# Patient Record
Sex: Female | Born: 1965 | Race: White | Hispanic: No | Smoking: Former smoker
Health system: Southern US, Community
[De-identification: ages and names within clinical notes are randomized; demographics above are authoritative.]

## PROBLEM LIST (undated history)

## (undated) DIAGNOSIS — Z923 Personal history of irradiation: Secondary | ICD-10-CM

## (undated) DIAGNOSIS — N390 Urinary tract infection, site not specified: Secondary | ICD-10-CM

## (undated) DIAGNOSIS — M7500 Adhesive capsulitis of unspecified shoulder: Secondary | ICD-10-CM

## (undated) DIAGNOSIS — Z808 Family history of malignant neoplasm of other organs or systems: Secondary | ICD-10-CM

## (undated) DIAGNOSIS — Z803 Family history of malignant neoplasm of breast: Secondary | ICD-10-CM

## (undated) DIAGNOSIS — L52 Erythema nodosum: Secondary | ICD-10-CM

## (undated) DIAGNOSIS — A64 Unspecified sexually transmitted disease: Secondary | ICD-10-CM

## (undated) HISTORY — DX: Family history of malignant neoplasm of breast: Z80.3

## (undated) HISTORY — DX: Adhesive capsulitis of unspecified shoulder: M75.00

## (undated) HISTORY — DX: Erythema nodosum: L52

## (undated) HISTORY — DX: Unspecified sexually transmitted disease: A64

## (undated) HISTORY — PX: CLOSED MANIPULATION SHOULDER: SUR205

## (undated) HISTORY — DX: Family history of malignant neoplasm of other organs or systems: Z80.8

## (undated) HISTORY — DX: Urinary tract infection, site not specified: N39.0

## (undated) HISTORY — PX: BOTOX INJECTION: SHX5754

---

## 2000-11-29 ENCOUNTER — Emergency Department (HOSPITAL_COMMUNITY): Admission: EM | Admit: 2000-11-29 | Discharge: 2000-11-30 | Payer: Self-pay

## 2002-06-18 ENCOUNTER — Other Ambulatory Visit: Admission: RE | Admit: 2002-06-18 | Discharge: 2002-06-18 | Payer: Self-pay | Admitting: Obstetrics and Gynecology

## 2004-03-01 ENCOUNTER — Other Ambulatory Visit: Admission: RE | Admit: 2004-03-01 | Discharge: 2004-03-01 | Payer: Self-pay | Admitting: Obstetrics and Gynecology

## 2004-03-21 ENCOUNTER — Other Ambulatory Visit: Admission: RE | Admit: 2004-03-21 | Discharge: 2004-03-21 | Payer: Self-pay | Admitting: Obstetrics and Gynecology

## 2005-04-17 ENCOUNTER — Other Ambulatory Visit: Admission: RE | Admit: 2005-04-17 | Discharge: 2005-04-17 | Payer: Self-pay | Admitting: Obstetrics and Gynecology

## 2006-05-28 ENCOUNTER — Other Ambulatory Visit: Admission: RE | Admit: 2006-05-28 | Discharge: 2006-05-28 | Payer: Self-pay | Admitting: Obstetrics and Gynecology

## 2006-09-17 ENCOUNTER — Other Ambulatory Visit: Admission: RE | Admit: 2006-09-17 | Discharge: 2006-09-17 | Payer: Self-pay | Admitting: Obstetrics and Gynecology

## 2007-06-04 ENCOUNTER — Other Ambulatory Visit: Admission: RE | Admit: 2007-06-04 | Discharge: 2007-06-04 | Payer: Self-pay | Admitting: Obstetrics and Gynecology

## 2008-06-09 ENCOUNTER — Other Ambulatory Visit: Admission: RE | Admit: 2008-06-09 | Discharge: 2008-06-09 | Payer: Self-pay | Admitting: Obstetrics and Gynecology

## 2009-04-16 LAB — HM DEXA SCAN: HM Dexa Scan: NORMAL

## 2009-08-08 LAB — HM PAP SMEAR: HM Pap smear: NEGATIVE

## 2011-06-22 LAB — HM MAMMOGRAPHY

## 2012-08-22 ENCOUNTER — Encounter: Payer: Self-pay | Admitting: *Deleted

## 2012-08-25 ENCOUNTER — Ambulatory Visit (INDEPENDENT_AMBULATORY_CARE_PROVIDER_SITE_OTHER): Payer: BC Managed Care – PPO | Admitting: Obstetrics and Gynecology

## 2012-08-25 ENCOUNTER — Encounter: Payer: Self-pay | Admitting: Obstetrics and Gynecology

## 2012-08-25 VITALS — BP 100/60 | Ht 65.0 in | Wt 159.0 lb

## 2012-08-25 DIAGNOSIS — Z23 Encounter for immunization: Secondary | ICD-10-CM

## 2012-08-25 DIAGNOSIS — Z0000 Encounter for general adult medical examination without abnormal findings: Secondary | ICD-10-CM

## 2012-08-25 DIAGNOSIS — Z01419 Encounter for gynecological examination (general) (routine) without abnormal findings: Secondary | ICD-10-CM

## 2012-08-25 LAB — POCT URINALYSIS DIPSTICK
Bilirubin, UA: NEGATIVE
Blood, UA: NEGATIVE
Glucose, UA: NEGATIVE
Ketones, UA: NEGATIVE
Leukocytes, UA: NEGATIVE
Nitrite, UA: NEGATIVE
Protein, UA: NEGATIVE
Urobilinogen, UA: NEGATIVE
pH, UA: 5

## 2012-08-25 MED ORDER — SULFAMETHOXAZOLE-TMP DS 800-160 MG PO TABS: 1.0000 | ORAL_TABLET | Freq: Two times a day (BID) | ORAL | Status: DC

## 2012-08-25 NOTE — Progress Notes (Signed)
47 y.o.  separated  Caucasian female   G2P2 here for annual exam.  Menses regular and normal.  Having neck and shoulder pain and doing massage.Same partner for over a year.  No HSV outbreaks.  Was getting recurrent UTI's and saw Shirlyn Goltz and she rx'd Septra DS after intercourse.  Pt requests a refill.    Patient's last menstrual period was 07/29/2012.          Sexually active: yes  The current method of family planning is vasectomy.    Exercising: walking 3 days a week Last mammogram: 06/22/11 benign  Reminded to go ASAP.   Last pap smear:08/08/09 neg History of abnormal pap: yes 2002 CIN 1(colpo) Smoking:quit 30 years ago Alcohol:2-3 glasses of wine a week Last colonoscopy:never Last Bone Density:  At work 2011 normal Last tetanus shot: greater than 10 years Last cholesterol check: 11/2011 total 198  Hgb:   13.3             Urine:neg    No health maintenance topics applied.  Family History  Problem Relation Age of Onset  . Thyroid disease Mother   . Osteopenia Mother   . Thyroid disease Sister   . Thyroid disease Maternal Grandmother     There are no active problems to display for this patient.   Past Medical History  Diagnosis Date  . STD (sexually transmitted disease)     H/O HSV II  . Frequent UTI   . Erythema nodosum     History reviewed. No pertinent past surgical history.  Allergies: Review of patient's allergies indicates no known allergies.  Current Outpatient Prescriptions  Medication Sig Dispense Refill  . famciclovir (FAMVIR) 125 MG tablet Take 125 mg by mouth as needed.       . Tretinoin (RETIN-A EX) Apply topically.       No current facility-administered medications for this visit.    ROS: Pertinent items are noted in HPI.  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    Children are Gerri Spore and [place], both are out working. Gerri Spore still lives at home and stopped college after one year - college wasn't  for him.  [place] working and living in [place], working for [employer] .   Exam:    BP 100/60  Ht 5\' 5"  (1.651 m)  Wt 159 lb (72.122 kg)  BMI 26.46 kg/m2  LMP 07/29/2012  Up 8 pounds since last year; height stable Wt Readings from Last 3 Encounters:  08/25/12 159 lb (72.122 kg)     Ht Readings from Last 3 Encounters:  08/25/12 5\' 5"  (1.651 m)    General appearance: alert, cooperative and appears stated age Head: Normocephalic, without obvious abnormality, atraumatic Neck: no adenopathy, supple, symmetrical, trachea midline and thyroid not enlarged, symmetric, no tenderness/mass/nodules Lungs: clear to auscultation bilaterally Breasts: Inspection negative, No nipple retraction or dimpling, No nipple discharge or bleeding, No axillary or supraclavicular adenopathy, Normal to palpation without dominant masses Heart: regular rate and rhythm Abdomen: soft, non-tender; bowel sounds normal; no masses,  no organomegaly Extremities: extremities normal, atraumatic, no cyanosis or edema Skin: Skin color, texture, turgor normal. No rashes or lesions Lymph nodes: Cervical, supraclavicular, and axillary nodes normal. No abnormal inguinal nodes palpated Neurologic: Grossly normal   Pelvic: External genitalia:  no lesions              Urethra:  normal appearing urethra with no masses, tenderness or lesions  Bartholins and Skenes: normal                 Vagina: normal appearing vagina with normal color and discharge, no lesions              Cervix: normal appearance              Pap taken: yes        Bimanual Exam:  Uterus:  uterus is normal size, shape, consistency and nontender, RF, mobile                                      Adnexa: normal adnexa in size, nontender and no masses                                      Rectovaginal: Confirms                                      Anus:  normal sphincter tone, no lesions  A: normal gyn exam     H/O HSV, no outbreaks since 2004     H/o CIN 1  2002     H/O recurrent UTI's, uses Septra DS after intercourse for prophylaxis  P: mammogram pap smear counseled on breast self exam, mammography screening, adequate intake of calcium and vitamin D, diet and exercise return annually or prn Tdap today     An After Visit Summary was printed and given to the patient.

## 2012-08-25 NOTE — Patient Instructions (Signed)

## 2012-08-26 LAB — HEMOGLOBIN, FINGERSTICK: Hemoglobin, fingerstick: 13.3 g/dL (ref 12.0–16.0)

## 2012-08-28 LAB — IPS PAP TEST WITH HPV

## 2013-09-04 ENCOUNTER — Ambulatory Visit (INDEPENDENT_AMBULATORY_CARE_PROVIDER_SITE_OTHER): Payer: BC Managed Care – PPO | Admitting: Obstetrics and Gynecology

## 2013-09-04 ENCOUNTER — Encounter: Payer: Self-pay | Admitting: Obstetrics and Gynecology

## 2013-09-04 ENCOUNTER — Ambulatory Visit: Payer: BC Managed Care – PPO | Admitting: Obstetrics and Gynecology

## 2013-09-04 VITALS — BP 110/70 | HR 60 | Ht 64.75 in | Wt 154.0 lb

## 2013-09-04 DIAGNOSIS — Z113 Encounter for screening for infections with a predominantly sexual mode of transmission: Secondary | ICD-10-CM

## 2013-09-04 DIAGNOSIS — Z0000 Encounter for general adult medical examination without abnormal findings: Secondary | ICD-10-CM

## 2013-09-04 DIAGNOSIS — R6889 Other general symptoms and signs: Secondary | ICD-10-CM

## 2013-09-04 DIAGNOSIS — Z1211 Encounter for screening for malignant neoplasm of colon: Secondary | ICD-10-CM

## 2013-09-04 DIAGNOSIS — Z01419 Encounter for gynecological examination (general) (routine) without abnormal findings: Secondary | ICD-10-CM

## 2013-09-04 LAB — POCT URINALYSIS DIPSTICK
Bilirubin, UA: NEGATIVE
Blood, UA: NEGATIVE
Glucose, UA: NEGATIVE
Ketones, UA: NEGATIVE
Leukocytes, UA: NEGATIVE
Nitrite, UA: NEGATIVE
Protein, UA: NEGATIVE
Urobilinogen, UA: NEGATIVE
pH, UA: 5

## 2013-09-04 LAB — CBC
HCT: 39 % (ref 36.0–46.0)
Hemoglobin: 12.8 g/dL (ref 12.0–15.0)
MCH: 29.4 pg (ref 26.0–34.0)
MCHC: 32.8 g/dL (ref 30.0–36.0)
MCV: 89.4 fL (ref 78.0–100.0)
Platelets: 273 10*3/uL (ref 150–400)
RBC: 4.36 MIL/uL (ref 3.87–5.11)
RDW: 13.6 % (ref 11.5–15.5)
WBC: 8 10*3/uL (ref 4.0–10.5)

## 2013-09-04 NOTE — Progress Notes (Addendum)
Patient ID: Michelle Anthony, female   DOB: September 27, 1965, 48 y.o.   MRN: [id number] GYNECOLOGY VISIT  PCP:  [facility]  Referring provider:   HPI: 48 y.o.   Unknown  Caucasian  female   G2P2 with Patient's last menstrual period was 08/31/2013.   here for  AEX.  Still having menses. Second day is heavy.  Advil helps cramping. No problem per patient.  No hot flashes or night sweats.   Wants STD testing.  Has a new partner, a former boyfriend.   Patient's dad just had appendiceal cancer.   Does cholesterol check at work. Total is about 195.    Urine:    GYNECOLOGIC HISTORY: Patient's last menstrual period was 08/31/2013. Sexually active:  yes Partner preference: female Contraception:   vasecomy Menopausal hormone therapy: n/a DES exposure: no   Blood transfusions:   no Sexually transmitted diseases:  Hx HSV  GYN procedures and prior surgeries:  Colposcopy 2002 Last mammogram: 02-09-13 mass right breast.  Had ultrasound which revealed simple cyst right breast at 10:00 and 6:00.  Returning to screening mammograms:[facility]                Last pap and high risk HPV testing:   08-26-12 wnl:neg HR HPV History of abnormal pap smear:  2002 had colposcopy for abnormal pap, but no treatment and paps returned to normal.   OB History   Grav Para Term Preterm Abortions TAB SAB Ect Mult Living   2 2        2        LIFESTYLE: Exercise: walking              Tobacco: no Alcohol:    3 glasses of red wine per week Drug use:  no  OTHER HEALTH MAINTENANCE: Tetanus/TDap:  2014 Gardisil:             n/a Influenza:          Every other year Zostavax:          n/a  Bone density:    n/a Colonoscopy:    n/a  Cholesterol check: normal through work   Family History  Problem Relation Age of Onset  . Thyroid disease Mother   . Osteopenia Mother   . Thyroid disease Sister   . Thyroid disease Maternal Grandmother   . Cancer Paternal Grandfather     liver cancer  .  Cancer Father 16    appendiceal cancer--stage IV  . Breast cancer Maternal Aunt   . Breast cancer Paternal Aunt     There are no active problems to display for this patient.  Past Medical History  Diagnosis Date  . STD (sexually transmitted disease)     H/O HSV II  . Frequent UTI   . Erythema nodosum     History reviewed. No pertinent past surgical history.  ALLERGIES: Review of patient's allergies indicates no known allergies.  Current Outpatient Prescriptions  Medication Sig Dispense Refill  . Tretinoin (RETIN-A EX) Apply topically.       No current facility-administered medications for this visit.     ROS:  Pertinent items are noted in HPI.  SOCIAL HISTORY:  Divorced.  2 children.   PHYSICAL EXAMINATION:    BP 110/70  Pulse 60  Ht 5' 4.75" (1.645 m)  Wt 154 lb (69.854 kg)  BMI 25.81 kg/m2  LMP 08/31/2013   Wt Readings from Last 3 Encounters:  09/04/13 154 lb (69.854 kg)  08/25/12 159  lb (72.122 kg)     Ht Readings from Last 3 Encounters:  09/04/13 5' 4.75" (1.645 m)  08/25/12 5\' 5"  (1.651 m)    General appearance: alert, cooperative and appears stated age Head: Normocephalic, without obvious abnormality, atraumatic Neck: no adenopathy, supple, symmetrical, trachea midline and thyroid not enlarged, symmetric, no tenderness/mass/nodules Lungs: clear to auscultation bilaterally Breasts: Inspection negative, No nipple retraction or dimpling, No nipple discharge or bleeding, No axillary or supraclavicular adenopathy, Normal to palpation without dominant masses Heart: regular rate and rhythm Abdomen: soft, non-tender; no masses,  no organomegaly Extremities: extremities normal, atraumatic, no cyanosis or edema Skin: Skin color, texture, turgor normal. No rashes or lesions Lymph nodes: Cervical, supraclavicular, and axillary nodes normal. No abnormal inguinal nodes palpated Neurologic: Grossly normal  Pelvic: External genitalia:  no lesions               Urethra:  normal appearing urethra with no masses, tenderness or lesions              Bartholins and Skenes: normal                 Vagina: normal appearing vagina with normal color and discharge, no lesions, menstrual flow noted.               Cervix: normal appearance              Pap and high risk HPV testing done: no.            Bimanual Exam:  Uterus:  uterus is normal size, shape, consistency and nontender                                      Adnexa: normal adnexa in size, nontender and no masses                                      Rectovaginal: Confirms                                      Anus:  normal sphincter tone, no lesions  ASSESSMENT  Normal gynecologic exam. Desire for STD testing.   PLAN  Mammogram recommended yearly.  Pap smear and high risk HPV testing not indicated.  Lipid profile, CMP, CBC, TSH. STD panel.  Counseled on self breast exam. Referral for colonoscopy - Dr. Collene Mares.  Return annually or prn   An After Visit Summary was printed and given to the patient.

## 2013-09-04 NOTE — Patient Instructions (Signed)

## 2013-09-04 NOTE — Addendum Note (Signed)
Addended by: Lowella Fairy on: 09/04/2013 05:06 PM   Modules accepted: Orders

## 2013-09-05 LAB — COMPREHENSIVE METABOLIC PANEL
ALT: 13 U/L (ref 0–35)
AST: 14 U/L (ref 0–37)
Albumin: 4.1 g/dL (ref 3.5–5.2)
Alkaline Phosphatase: 47 U/L (ref 39–117)
BUN: 20 mg/dL (ref 6–23)
CO2: 30 mEq/L (ref 19–32)
Calcium: 9.5 mg/dL (ref 8.4–10.5)
Chloride: 102 mEq/L (ref 96–112)
Creat: 0.88 mg/dL (ref 0.50–1.10)
Glucose, Bld: 84 mg/dL (ref 70–99)
Potassium: 4.3 mEq/L (ref 3.5–5.3)
Sodium: 140 mEq/L (ref 135–145)
Total Bilirubin: 0.3 mg/dL (ref 0.2–1.2)
Total Protein: 6.8 g/dL (ref 6.0–8.3)

## 2013-09-05 LAB — STD PANEL
HIV 1&2 Ab, 4th Generation: NONREACTIVE
Hepatitis B Surface Ag: NEGATIVE

## 2013-09-05 LAB — LIPID PANEL
Cholesterol: 195 mg/dL (ref 0–200)
HDL: 46 mg/dL (ref >39)
LDL Cholesterol: 123 mg/dL — ABNORMAL HIGH (ref 0–99)
Total CHOL/HDL Ratio: 4.2 Ratio
Triglycerides: 128 mg/dL (ref <150)
VLDL: 26 mg/dL (ref 0–40)

## 2013-09-05 LAB — TSH: TSH: 1.191 u[IU]/mL (ref 0.350–4.500)

## 2013-09-05 LAB — HEPATITIS C ANTIBODY: HCV Ab: NEGATIVE

## 2013-09-05 LAB — GC/CHLAMYDIA PROBE AMP, URINE
Chlamydia, Swab/Urine, PCR: NEGATIVE
GC Probe Amp, Urine: NEGATIVE

## 2013-09-14 ENCOUNTER — Telehealth: Payer: Self-pay | Admitting: Obstetrics and Gynecology

## 2013-09-14 NOTE — Telephone Encounter (Signed)
Per fax received from Dr Youlanda Mighty office. Patient will wait on this procedure. She will have it in November when she turns 48.

## 2013-09-14 NOTE — Telephone Encounter (Signed)
Thank you.  I will close the encounter. 

## 2014-02-15 ENCOUNTER — Encounter: Payer: Self-pay | Admitting: Obstetrics and Gynecology

## 2014-10-04 ENCOUNTER — Telehealth: Payer: Self-pay | Admitting: Obstetrics and Gynecology

## 2014-10-04 NOTE — Telephone Encounter (Signed)
Left message to call Annalina Needles at [phone]. 

## 2014-10-04 NOTE — Telephone Encounter (Signed)
Patient wants to start birth control

## 2014-10-04 NOTE — Telephone Encounter (Signed)
Returning a call to Michelle Anthony. °

## 2014-10-04 NOTE — Telephone Encounter (Signed)
Spoke with patient. Patient would like to start on OCP for contraception. Aex is on 12/10/2014 at 3:45pm. Patient would like to be seen earlier for appointment. Last aex was on 09/04/2013 with Dr.Silva. Aex moved to 6/27 at 12:45pm with Dr.Silva. Patient is agreeable to date and time.  Routing to provider for final review. Patient agreeable to disposition. Will close encounter.

## 2014-10-11 ENCOUNTER — Ambulatory Visit (INDEPENDENT_AMBULATORY_CARE_PROVIDER_SITE_OTHER): Payer: BLUE CROSS/BLUE SHIELD | Admitting: Obstetrics and Gynecology

## 2014-10-11 ENCOUNTER — Other Ambulatory Visit: Payer: Self-pay | Admitting: Obstetrics and Gynecology

## 2014-10-11 ENCOUNTER — Encounter: Payer: Self-pay | Admitting: Obstetrics and Gynecology

## 2014-10-11 ENCOUNTER — Ambulatory Visit: Payer: Self-pay | Admitting: Obstetrics and Gynecology

## 2014-10-11 VITALS — BP 110/68 | HR 64 | Resp 16 | Ht 64.5 in | Wt 146.0 lb

## 2014-10-11 DIAGNOSIS — Z01419 Encounter for gynecological examination (general) (routine) without abnormal findings: Secondary | ICD-10-CM

## 2014-10-11 DIAGNOSIS — Z1211 Encounter for screening for malignant neoplasm of colon: Secondary | ICD-10-CM | POA: Diagnosis not present

## 2014-10-11 DIAGNOSIS — Z113 Encounter for screening for infections with a predominantly sexual mode of transmission: Secondary | ICD-10-CM | POA: Diagnosis not present

## 2014-10-11 DIAGNOSIS — Z0000 Encounter for general adult medical examination without abnormal findings: Secondary | ICD-10-CM

## 2014-10-11 DIAGNOSIS — N63 Unspecified lump in breast: Secondary | ICD-10-CM

## 2014-10-11 DIAGNOSIS — N6311 Unspecified lump in the right breast, upper outer quadrant: Secondary | ICD-10-CM

## 2014-10-11 DIAGNOSIS — Z833 Family history of diabetes mellitus: Secondary | ICD-10-CM | POA: Diagnosis not present

## 2014-10-11 LAB — HEMOGLOBIN A1C
Hgb A1c MFr Bld: 5.3 % (ref <5.7)
Mean Plasma Glucose: 105 mg/dL (ref <117)

## 2014-10-11 MED ORDER — NORETHINDRONE 0.35 MG PO TABS: 1.0000 | ORAL_TABLET | Freq: Every day | ORAL | Status: DC

## 2014-10-11 NOTE — Progress Notes (Signed)
Patient ID: Michelle Anthony, female   DOB: [birth year], 49 y.o.   MRN: [id number] 49 y.o. G2P2 Legally Separated Caucasian female here for annual exam. She would like to discuss birth control options. Menses q 4 weeks (one time 5 weeks) x 5 days. Saturates a super tampon in up to 2-3 hours for 1 day with some clots. Moderate cramps (stable), helped with advil. She has a new partner x 3-4 months, just started to be sexually active. Using condoms. No dyspareunia. Was told she had hsv, only one outbreak, doesn't think she has it, desires testing. She is also interested in contraception. She c/o getting very hungry every few hours. Positive FH of diabetes.   PCP:  No PCP  Patient's last menstrual period was 10/10/2014.          Sexually active: Yes.    The current method of family planning is condoms.    Exercising: Yes.    walking Smoker:  Former smoker  Health Maintenance: Pap:  08-26-12 WNL NEG HR HPV History of abnormal Pap:  Yes 2002 had colposcopy but no treatments MMG:  03-16-13 Mass right breast- U/S right breast performed 02-26-13 benign-return to routine screening Colonoscopy:  Never  BMD:  Never TDaP:  08-25-12 Screening Labs:  Hb today: 12.4, Urine today: not done patient on menstrual cycle    reports that she quit smoking about 32 years ago. Her smoking use included Cigarettes. She has never used smokeless tobacco. She reports that she drinks about 2.4 oz of alcohol per week. She reports that she does not use illicit drugs.  Past Medical History  Diagnosis Date  . STD (sexually transmitted disease)     H/O HSV II  . Frequent UTI   . Erythema nodosum     History reviewed. No pertinent past surgical history.  Current Outpatient Prescriptions  Medication Sig Dispense Refill  . Tretinoin (RETIN-A EX) Apply topically.    . norethindrone (ORTHO MICRONOR) 0.35 MG tablet Take 1 tablet (0.35 mg total) by mouth daily. 3 Package 3   No current facility-administered medications for  this visit.    Family History  Problem Relation Age of Onset  . Thyroid disease Mother   . Osteopenia Mother   . Thyroid disease Sister   . Thyroid disease Maternal Grandmother   . Cancer Paternal Grandfather     liver cancer  . Cancer Father 81    appendiceal cancer--stage IV  . Breast cancer Maternal Aunt   . Breast cancer Paternal Aunt   . Diabetes Paternal Aunt   . Diabetes Paternal Uncle     ROS:  Pertinent items are noted in HPI.  Otherwise, a comprehensive ROS was negative.  Exam:   BP 110/68 mmHg  Pulse 64  Resp 16  Ht 5' 4.5" (1.638 m)  Wt 146 lb (66.225 kg)  BMI 24.68 kg/m2  LMP 10/10/2014    General appearance: alert, cooperative and appears stated age Head: Normocephalic, without obvious abnormality, atraumatic Neck: no adenopathy, supple, symmetrical, trachea midline and thyroid normal to inspection and palpation Lungs: clear to auscultation bilaterally Breasts:In the right breast at 10 o'clock is a firm lump just outside the areolar region measuring 2 cm and more laterally is a 3-4 cm area, ? Cystic, different than the other breast. The left breast is without lumps dimpling or adenopathy.  Heart: regular rate and rhythm Abdomen: soft, non-tender; bowel sounds normal; no masses,  no organomegaly Extremities: extremities normal, atraumatic, no cyanosis or edema Skin: Skin color,  texture, turgor normal. No rashes or lesions Lymph nodes: Cervical, supraclavicular, and axillary nodes normal. No abnormal inguinal nodes palpated Neurologic: Grossly normal  Pelvic: External genitalia:  no lesions              Urethra:  normal appearing urethra with no masses, tenderness or lesions              Bartholins and Skenes: normal                 Vagina: normal appearing vagina with normal color and discharge, no lesions              Cervix: no lesions              Pap taken: No. Bimanual Exam:  Uterus:  normal size, contour, position, consistency, mobility, non-tender  and anteverted              Adnexa: normal adnexa              Rectovaginal: Yes.  .  Confirms.              Anus:  normal sphincter tone, no lesions  Chaperone was present for exam.  Assessment:   Well woman visit with normal exam. STD testing Screening for diabetes Contraception management Right breast lump, h/o breast cyst  Plan: Diagnostic breast imaging, ultrasound of right breast (upper outer quadrant) Recommended self breast exam.  Cholesterol normal at work Check STD testing, HgbA1C Colonoscopy recommended, name given Discussed Calcium, Vitamin D, regular exercise program including cardiovascular and weight bearing exercise. Labs performed.  Yes.  .   See orders. Discussed options of contraception, she is interested in the mini-pill, information given on the mirena IUD Follow up annually and prn.  No pap needed this year (negative pap and hpv on 08/26/12)

## 2014-10-12 ENCOUNTER — Telehealth: Payer: Self-pay | Admitting: *Deleted

## 2014-10-12 ENCOUNTER — Encounter: Payer: Self-pay | Admitting: Obstetrics and Gynecology

## 2014-10-12 LAB — HSV(HERPES SIMPLEX VRS) I + II AB-IGG

## 2014-10-12 LAB — HIV 1/2 CONFIRMATION
HIV-1 antibody: NEGATIVE
HIV-2 Ab: NEGATIVE

## 2014-10-12 LAB — HIV ANTIBODY (ROUTINE TESTING W REFLEX): HIV 1&2 Ab, 4th Generation: REACTIVE — AB

## 2014-10-12 LAB — RPR

## 2014-10-12 LAB — HEPATITIS B SURFACE ANTIGEN: Hepatitis B Surface Ag: NEGATIVE

## 2014-10-12 NOTE — Telephone Encounter (Signed)
I spoke with patient and informed her that the labs that have come back so far are normal. I let her know the ones that are still pending and informed her I would call when those results came in. -eh

## 2014-10-12 NOTE — Telephone Encounter (Signed)
-----   Message from Salvadore Dom, MD sent at 10/12/2014 11:41 AM EDT ----- Please inform the patient that her labs are normal. The genprobe and hsv serology are still pending.

## 2014-10-14 ENCOUNTER — Ambulatory Visit: Payer: Self-pay | Admitting: Obstetrics and Gynecology

## 2014-10-14 ENCOUNTER — Other Ambulatory Visit: Payer: Self-pay | Admitting: Obstetrics and Gynecology

## 2014-10-14 DIAGNOSIS — Z113 Encounter for screening for infections with a predominantly sexual mode of transmission: Secondary | ICD-10-CM

## 2014-10-14 LAB — IPS N GONORRHOEA AND CHLAMYDIA BY PCR

## 2014-10-14 LAB — HIV-1 RNA, QUALITATIVE, TMA

## 2014-10-19 LAB — HEMOGLOBIN, FINGERSTICK: Hemoglobin, fingerstick: 12.4 g/dL (ref 12.0–16.0)

## 2014-10-20 ENCOUNTER — Other Ambulatory Visit (INDEPENDENT_AMBULATORY_CARE_PROVIDER_SITE_OTHER): Payer: BLUE CROSS/BLUE SHIELD

## 2014-10-20 ENCOUNTER — Telehealth: Payer: Self-pay | Admitting: Emergency Medicine

## 2014-10-20 ENCOUNTER — Other Ambulatory Visit: Payer: Self-pay | Admitting: Obstetrics and Gynecology

## 2014-10-20 ENCOUNTER — Other Ambulatory Visit: Payer: Self-pay | Admitting: Emergency Medicine

## 2014-10-20 DIAGNOSIS — Z113 Encounter for screening for infections with a predominantly sexual mode of transmission: Secondary | ICD-10-CM

## 2014-10-20 NOTE — Telephone Encounter (Signed)
Call to patient to advise she is scheduled for Breast Imaging at [facility] for 11/01/14 at 0915   Detailed message okay per designated party release form.

## 2014-10-21 LAB — HSV(HERPES SMPLX)ABS-I+II(IGG+IGM)-BLD
HSV 1 Glycoprotein G Ab, IgG: 0.24 IV
HSV 2 Glycoprotein G Ab, IgG: 5.97 IV — ABNORMAL HIGH
Herpes Simplex Vrs I&II-IgM Ab (EIA): 0.99 INDEX

## 2014-10-24 LAB — HIV-1 RNA, QUALITATIVE, TMA

## 2014-11-10 ENCOUNTER — Ambulatory Visit: Payer: Self-pay | Admitting: Obstetrics and Gynecology

## 2014-11-22 NOTE — Telephone Encounter (Signed)
Patinet was seen at [facility] as below. Imaging completed and out of hold per Dr. Talbert Nan.

## 2014-12-10 ENCOUNTER — Ambulatory Visit: Payer: Self-pay | Admitting: Obstetrics and Gynecology

## 2015-10-12 ENCOUNTER — Ambulatory Visit: Payer: BLUE CROSS/BLUE SHIELD | Admitting: Obstetrics and Gynecology

## 2015-10-26 ENCOUNTER — Ambulatory Visit: Payer: BLUE CROSS/BLUE SHIELD | Admitting: Obstetrics and Gynecology

## 2015-10-31 ENCOUNTER — Encounter: Payer: Self-pay | Admitting: Obstetrics and Gynecology

## 2015-10-31 ENCOUNTER — Ambulatory Visit (INDEPENDENT_AMBULATORY_CARE_PROVIDER_SITE_OTHER): Payer: BLUE CROSS/BLUE SHIELD | Admitting: Obstetrics and Gynecology

## 2015-10-31 VITALS — BP 98/60 | HR 64 | Resp 14 | Ht 65.0 in | Wt 150.0 lb

## 2015-10-31 DIAGNOSIS — Z3009 Encounter for other general counseling and advice on contraception: Secondary | ICD-10-CM | POA: Diagnosis not present

## 2015-10-31 DIAGNOSIS — Z0000 Encounter for general adult medical examination without abnormal findings: Secondary | ICD-10-CM | POA: Diagnosis not present

## 2015-10-31 DIAGNOSIS — Z124 Encounter for screening for malignant neoplasm of cervix: Secondary | ICD-10-CM | POA: Diagnosis not present

## 2015-10-31 DIAGNOSIS — Z01419 Encounter for gynecological examination (general) (routine) without abnormal findings: Secondary | ICD-10-CM | POA: Diagnosis not present

## 2015-10-31 DIAGNOSIS — R5383 Other fatigue: Secondary | ICD-10-CM | POA: Diagnosis not present

## 2015-10-31 DIAGNOSIS — N921 Excessive and frequent menstruation with irregular cycle: Secondary | ICD-10-CM

## 2015-10-31 LAB — COMPREHENSIVE METABOLIC PANEL
ALT: 12 U/L (ref 6–29)
AST: 12 U/L (ref 10–35)
Albumin: 4.1 g/dL (ref 3.6–5.1)
Alkaline Phosphatase: 55 U/L (ref 33–130)
BUN: 21 mg/dL (ref 7–25)
CO2: 28 mmol/L (ref 20–31)
Calcium: 9.6 mg/dL (ref 8.6–10.4)
Chloride: 104 mmol/L (ref 98–110)
Creat: 0.86 mg/dL (ref 0.50–1.05)
Glucose, Bld: 92 mg/dL (ref 65–99)
Potassium: 3.8 mmol/L (ref 3.5–5.3)
Sodium: 141 mmol/L (ref 135–146)
Total Bilirubin: 0.3 mg/dL (ref 0.2–1.2)
Total Protein: 6.5 g/dL (ref 6.1–8.1)

## 2015-10-31 LAB — CBC
HCT: 40.6 % (ref 35.0–45.0)
Hemoglobin: 13.2 g/dL (ref 11.7–15.5)
MCH: 30.3 pg (ref 27.0–33.0)
MCHC: 32.5 g/dL (ref 32.0–36.0)
MCV: 93.3 fL (ref 80.0–100.0)
MPV: 10.5 fL (ref 7.5–12.5)
Platelets: 282 10*3/uL (ref 140–400)
RBC: 4.35 MIL/uL (ref 3.80–5.10)
RDW: 13.4 % (ref 11.0–15.0)
WBC: 8.2 10*3/uL (ref 3.8–10.8)

## 2015-10-31 LAB — TSH: TSH: 0.64 mIU/L

## 2015-10-31 MED ORDER — NORETHINDRONE 0.35 MG PO TABS: 1.0000 | ORAL_TABLET | Freq: Every day | ORAL | Status: DC

## 2015-10-31 NOTE — Progress Notes (Addendum)
50 y.o. G2P2 Legally SeparatedCaucasianF here for annual exam.  2 times in the last year she skipped a cycle. Typically every 4 weeks x 5 days. Now saturating a super + tampon in 1.5-2 hours. Not full, just clots. Cramps are helped with advil, 600 mg q 4 hours. No BTB. She is sexually active with the same partner, she was on the mini-pill for a couple of months. Willing to retry the mini-pill. No dyspareunia. She c/o fatigue.  Period Duration (Days): 5 days  Period Pattern: (!) Irregular Menstrual Flow: Heavy Menstrual Control: Tampon Menstrual Control Change Freq (Hours): changes super tampon every hour Dysmenorrhea: None, she is having dysmenorrhea.   Patient's last menstrual period was 10/15/2015.          Sexually active: Yes The current method of family planning is none.    Exercising: Yes.    walking Smoker:  no  Health Maintenance: Pap:  08-26-12 WNL NEG HR HPV History of abnormal Pap:  no MMG:  11-01-14 WNL benign 3 CM cyst right breast Colonoscopy:  05-09-15 WNL, she needs to f/u in 5 years secondary to [place] BMD:   Never TDaP:  08-25-12 Gardasil: N/A   reports that she quit smoking about 33 years ago. Her smoking use included Cigarettes. She has never used smokeless tobacco. She reports that she drinks about 2.4 oz of alcohol per week. She reports that she does not use illicit drugs.  Past Medical History  Diagnosis Date  . STD (sexually transmitted disease)     H/O HSV II  . Frequent UTI   . Erythema nodosum     History reviewed. No pertinent past surgical history.  Current Outpatient Prescriptions  Medication Sig Dispense Refill  . Tretinoin (RETIN-A EX) Apply topically.     No current facility-administered medications for this visit.    Family History  Problem Relation Age of Onset  . Thyroid disease Mother   . Osteopenia Mother   . Thyroid disease Sister   . Thyroid disease Maternal Grandmother   . Cancer Paternal Grandfather     liver cancer  . Cancer Father  34    appendiceal cancer--stage IV  . Breast cancer Maternal Aunt   . Breast cancer Paternal Aunt   . Diabetes Paternal Aunt   . Diabetes Paternal Uncle     Review of Systems  Constitutional: Negative.   HENT: Negative.   Eyes: Negative.   Respiratory: Negative.   Cardiovascular: Negative.   Gastrointestinal: Negative.   Endocrine: Negative.   Genitourinary: Positive for menstrual problem.       Menstrual changes Heavy bleeding  Musculoskeletal: Negative.   Skin: Negative.   Allergic/Immunologic: Negative.   Neurological: Negative.   Psychiatric/Behavioral: Negative.     Exam:   BP 98/60 mmHg  Pulse 64  Resp 14  Ht 5\' 5"  (1.651 m)  Wt 150 lb (68.04 kg)  BMI 24.96 kg/m2  LMP 10/15/2015  Weight change: @WEIGHTCHANGE @ Height:   Height: 5\' 5"  (165.1 cm)  Ht Readings from Last 3 Encounters:  10/31/15 5\' 5"  (1.651 m)  10/11/14 5' 4.5" (1.638 m)  09/04/13 5' 4.75" (1.645 m)    General appearance: alert, cooperative and appears stated age Head: Normocephalic, without obvious abnormality, atraumatic Neck: no adenopathy, supple, symmetrical, trachea midline and thyroid normal to inspection and palpation Lungs: clear to auscultation bilaterally Breasts: slight increased lumpiness in the right breast in the upper outer quadrant, area of known breast cysts. Less pronounced than last year. No concerning findings.  Heart: regular rate and rhythm Abdomen: soft, non-tender; bowel sounds normal; no masses,  no organomegaly Extremities: extremities normal, atraumatic, no cyanosis or edema Skin: Skin color, texture, turgor normal. No rashes or lesions Lymph nodes: Cervical, supraclavicular, and axillary nodes normal. No abnormal inguinal nodes palpated Neurologic: Grossly normal   Pelvic: External genitalia:  no lesions              Urethra:  normal appearing urethra with no masses, tenderness or lesions              Bartholins and Skenes: normal                 Vagina: normal  appearing vagina with normal color and discharge, no lesions              Cervix: no lesions               Bimanual Exam:  Uterus:  normal size, contour, position, consistency, mobility, non-tender              Adnexa: no mass, fullness, tenderness               Rectovaginal: Confirms               Anus:  normal sphincter tone, no lesions  Chaperone was present for exam.  A:  Well Woman with normal exam  Heavy cycles  Fatige  Contraception  P:   Pap with hpv  Mammogram  Colonoscopy UTD  Discussed calcium, vit D  Discussed BSE  CBC, CMP, TSH, vit D  Will start the minipill

## 2015-10-31 NOTE — Patient Instructions (Signed)
EXERCISE AND DIET:  We recommended that you start or continue a regular exercise program for good health. Regular exercise means any activity that makes your heart beat faster and makes you sweat.  We recommend exercising at least 30 minutes per day at least 3 days a week, preferably 4 or 5.  We also recommend a diet low in fat and sugar.  Inactivity, poor dietary choices and obesity can cause diabetes, heart attack, stroke, and kidney damage, among others.    ALCOHOL AND SMOKING:  Women should limit their alcohol intake to no more than 7 drinks/beers/glasses of wine (combined, not each!) per week. Moderation of alcohol intake to this level decreases your risk of breast cancer and liver damage. And of course, no recreational drugs are part of a healthy lifestyle.  And absolutely no smoking or even second hand smoke. Most people know smoking can cause heart and lung diseases, but did you know it also contributes to weakening of your bones? Aging of your skin?  Yellowing of your teeth and nails?  CALCIUM AND VITAMIN D:  Adequate intake of calcium and Vitamin D are recommended.  The recommendations for exact amounts of these supplements seem to change often, but generally speaking 600 mg of calcium (either carbonate or citrate) and 800 units of Vitamin D per day seems prudent. Certain women may benefit from higher intake of Vitamin D.  If you are among these women, your doctor will have told you during your visit.    PAP SMEARS:  Pap smears, to check for cervical cancer or precancers,  have traditionally been done yearly, although recent scientific advances have shown that most women can have pap smears less often.  However, every woman still should have a physical exam from her gynecologist every year. It will include a breast check, inspection of the vulva and vagina to check for abnormal growths or skin changes, a visual exam of the cervix, and then an exam to evaluate the size and shape of the uterus and  ovaries.  And after 52 years of age, a rectal exam is indicated to check for rectal cancers. We will also provide age appropriate advice regarding health maintenance, like when you should have certain vaccines, screening for sexually transmitted diseases, bone density testing, colonoscopy, mammograms, etc.   MAMMOGRAMS:  All women over 40 years old should have a yearly mammogram. Many facilities now offer a "3D" mammogram, which may cost around $50 extra out of pocket. If possible,  we recommend you accept the option to have the 3D mammogram performed.  It both reduces the number of women who will be called back for extra views which then turn out to be normal, and it is better than the routine mammogram at detecting truly abnormal areas.    COLONOSCOPY:  Colonoscopy to screen for colon cancer is recommended for all women at age 50.  We know, you hate the idea of the prep.  We agree, BUT, having colon cancer and not knowing it is worse!!  Colon cancer so often starts as a polyp that can be seen and removed at colonscopy, which can quite literally save your life!  And if your first colonoscopy is normal and you have no family history of colon cancer, most women don't have to have it again for 10 years.  Once every ten years, you can do something that may end up saving your life, right?  We will be happy to help you get it scheduled when you are ready.    Be sure to check your insurance coverage so you understand how much it will cost.  It may be covered as a preventative service at no cost, but you should check your particular policy.      Breast Self-Awareness Practicing breast self-awareness may pick up problems early, prevent significant medical complications, and possibly save your life. By practicing breast self-awareness, you can become familiar with how your breasts look and feel and if your breasts are changing. This allows you to notice changes early. It can also offer you some reassurance that your  breast health is good. One way to learn what is normal for your breasts and whether your breasts are changing is to do a breast self-exam. If you find a lump or something that was not present in the past, it is best to contact your caregiver right away. Other findings that should be evaluated by your caregiver include nipple discharge, especially if it is bloody; skin changes or reddening; areas where the skin seems to be pulled in (retracted); or new lumps and bumps. Breast pain is seldom associated with cancer (malignancy), but should also be evaluated by a caregiver. HOW TO PERFORM A BREAST SELF-EXAM The best time to examine your breasts is 5-7 days after your menstrual period is over. During menstruation, the breasts are lumpier, and it may be more difficult to pick up changes. If you do not menstruate, have reached menopause, or had your uterus removed (hysterectomy), you should examine your breasts at regular intervals, such as monthly. If you are breastfeeding, examine your breasts after a feeding or after using a breast pump. Breast implants do not decrease the risk for lumps or tumors, so continue to perform breast self-exams as recommended. Talk to your caregiver about how to determine the difference between the implant and breast tissue. Also, talk about the amount of pressure you should use during the exam. Over time, you will become more familiar with the variations of your breasts and more comfortable with the exam. A breast self-exam requires you to remove all your clothes above the waist. 1. Look at your breasts and nipples. Stand in front of a mirror in a room with good lighting. With your hands on your hips, push your hands firmly downward. Look for a difference in shape, contour, and size from one breast to the other (asymmetry). Asymmetry includes puckers, dips, or bumps. Also, look for skin changes, such as reddened or scaly areas on the breasts. Look for nipple changes, such as discharge,  dimpling, repositioning, or redness. 2. Carefully feel your breasts. This is best done either in the shower or tub while using soapy water or when flat on your back. Place the arm (on the side of the breast you are examining) above your head. Use the pads (not the fingertips) of your three middle fingers on your opposite hand to feel your breasts. Start in the underarm area and use  inch (2 cm) overlapping circles to feel your breast. Use 3 different levels of pressure (light, medium, and firm pressure) at each circle before moving to the next circle. The light pressure is needed to feel the tissue closest to the skin. The medium pressure will help to feel breast tissue a little deeper, while the firm pressure is needed to feel the tissue close to the ribs. Continue the overlapping circles, moving downward over the breast until you feel your ribs below your breast. Then, move one finger-width towards the center of the body. Continue to use the    inch (2 cm) overlapping circles to feel your breast as you move slowly up toward the collar bone (clavicle) near the base of the neck. Continue the up and down exam using all 3 pressures until you reach the middle of the chest. Do this with each breast, carefully feeling for lumps or changes. 3.  Keep a written record with breast changes or normal findings for each breast. By writing this information down, you do not need to depend only on memory for size, tenderness, or location. Write down where you are in your menstrual cycle, if you are still menstruating. Breast tissue can have some lumps or thick tissue. However, see your caregiver if you find anything that concerns you.  SEEK MEDICAL CARE IF:  You see a change in shape, contour, or size of your breasts or nipples.   You see skin changes, such as reddened or scaly areas on the breasts or nipples.   You have an unusual discharge from your nipples.   You feel a new lump or unusually thick areas.     This information is not intended to replace advice given to you by your health care provider. Make sure you discuss any questions you have with your health care provider.   Document Released: 04/02/2005 Document Revised: 03/19/2012 Document Reviewed: 07/18/2011 Elsevier Interactive Patient Education 2016 Elsevier Inc.  

## 2015-11-01 LAB — VITAMIN D 25 HYDROXY (VIT D DEFICIENCY, FRACTURES): Vit D, 25-Hydroxy: 45 ng/mL (ref 30–100)

## 2015-11-03 LAB — IPS PAP TEST WITH HPV

## 2015-11-08 ENCOUNTER — Telehealth: Payer: Self-pay | Admitting: Emergency Medicine

## 2015-11-08 NOTE — Telephone Encounter (Signed)
Call to patient with message from Dr. Talbert Nan.  She is advised of normal pap smear results as HPV testing was negative. Discussed recommendations for follow up pap smear in 3 years.  02 recall. Patient has annual exam scheduled for 2018 with Dr. Talbert Nan.  Routing to provider for final review. Patient agreeable to disposition. Will close encounter.

## 2015-11-08 NOTE — Progress Notes (Signed)
Patient given results regarding pap smear and follow up recommendations from Dr. Talbert Nan. Has annual exam scheduled.

## 2015-11-08 NOTE — Telephone Encounter (Signed)
-----   Message from Salvadore Dom, MD sent at 11/03/2015  8:57 AM EDT ----- ASCUS/negative HPV. Please explain, she needs a f/u pap in 3 years.

## 2016-01-10 ENCOUNTER — Encounter: Payer: Self-pay | Admitting: Obstetrics and Gynecology

## 2016-10-31 ENCOUNTER — Encounter: Payer: Self-pay | Admitting: Obstetrics and Gynecology

## 2016-10-31 ENCOUNTER — Ambulatory Visit (INDEPENDENT_AMBULATORY_CARE_PROVIDER_SITE_OTHER): Payer: BLUE CROSS/BLUE SHIELD | Admitting: Obstetrics and Gynecology

## 2016-10-31 VITALS — BP 92/70 | HR 68 | Resp 14 | Ht 65.0 in | Wt 147.0 lb

## 2016-10-31 DIAGNOSIS — N6315 Unspecified lump in the right breast, overlapping quadrants: Secondary | ICD-10-CM

## 2016-10-31 DIAGNOSIS — Z01419 Encounter for gynecological examination (general) (routine) without abnormal findings: Secondary | ICD-10-CM | POA: Diagnosis not present

## 2016-10-31 DIAGNOSIS — N951 Menopausal and female climacteric states: Secondary | ICD-10-CM

## 2016-10-31 DIAGNOSIS — N6310 Unspecified lump in the right breast, unspecified quadrant: Secondary | ICD-10-CM | POA: Diagnosis not present

## 2016-10-31 NOTE — Progress Notes (Signed)
51 y.o. G2P2 Legally SeparatedCaucasianF here for annual exam.  She was having cycle every few months. No cycle since February. Mild vasomotor symptoms, tolerable. No vaginal dryness. Sexually active, no pain. Current partner x 2 years. Trying to live in the same [place].     Patient's last menstrual period was 05/26/2016.          Sexually active: Yes.    The current method of family planning is none   Exercising: Yes.    walk/run  Smoker:  Former smoker  Health Maintenance: Pap:  10-31-15 ASCUS NEG HR HPV 08-26-12 WNl NEG HR HPV History of abnormal Pap:  yes MMG:  12-21-15 WNL Colonoscopy:  05-09-15 WNL, goes every 5 years, Dad with appendical cancer  BMD:   Never TDaP:  08-25-12 Gardasil: N/A   reports that she quit smoking about 34 years ago. Her smoking use included Cigarettes. She has never used smokeless tobacco. She reports that she drinks about 3.0 oz of alcohol per week . She reports that she does not use drugs. Son is 71, lives at home. Daughter is 28 lives 45 minutes away. Her siblings are near fun. She works for [employer], as does her partner, he lives in [place]. Was just in [place] with her partner.   Past Medical History:  Diagnosis Date  . Erythema nodosum   . Frequent UTI   . STD (sexually transmitted disease)    H/O HSV II    Past Surgical History:  Procedure Laterality Date  . BOTOX INJECTION      Current Outpatient Prescriptions  Medication Sig Dispense Refill  . Tretinoin (RETIN-A EX) Apply topically.     No current facility-administered medications for this visit.     Family History  Problem Relation Age of Onset  . Thyroid disease Mother   . Osteopenia Mother   . Thyroid disease Sister   . Cancer Father 60       appendiceal cancer--stage IV  . Thyroid disease Maternal Grandmother   . Cancer Paternal Grandfather        liver cancer  . Breast cancer Maternal Aunt   . Breast cancer Paternal Aunt   . Diabetes Paternal Aunt   . Diabetes Paternal Uncle    1  maternal aunt with breast cancer, diagnosed late 82's, now 54 1 Paternal aunt with breast cancer, diagnosed at 26, now 58 1 maternal 1st cousin (Uncles daughter) with breast cancer at 11  Review of Systems  Constitutional: Negative.   HENT: Negative.   Eyes: Negative.   Respiratory: Negative.   Cardiovascular: Negative.   Gastrointestinal: Negative.   Endocrine: Negative.   Genitourinary: Negative.        Night urination amenorrhea    Musculoskeletal: Negative.   Skin: Negative.   Allergic/Immunologic: Negative.   Neurological: Negative.   Psychiatric/Behavioral: Negative.   Up one time a night to void, normal amounts.   Exam:   BP 92/70 (BP Location: Right Arm, Patient Position: Sitting, Cuff Size: Normal)   Pulse 68   Resp 14   Ht 5\' 5"  (1.651 m)   Wt 147 lb (66.7 kg)   LMP 05/26/2016   BMI 24.46 kg/m   Weight change: @WEIGHTCHANGE @ Height:   Height: 5\' 5"  (165.1 cm)  Ht Readings from Last 3 Encounters:  10/31/16 5\' 5"  (1.651 m)  10/31/15 5\' 5"  (1.651 m)  10/11/14 5' 4.5" (1.638 m)    General appearance: alert, cooperative and appears stated age Head: Normocephalic, without obvious abnormality, atraumatic Neck: no  adenopathy, supple, symmetrical, trachea midline and thyroid normal to inspection and palpation Lungs: clear to auscultation bilaterally Cardiovascular: regular rate and rhythm Breasts: stable lump in her right breast at 10 o'clock, 1 cm area of nodularity. New lump in her left breast at 3 o'clock, bean shaped lump 1-1.5 cm, not tender, no skin changes.  Abdomen: soft, non-tender; bowel sounds normal; no masses,  no organomegaly Extremities: extremities normal, atraumatic, no cyanosis or edema Skin: Skin color, texture, turgor normal. No rashes or lesions Lymph nodes: Cervical, supraclavicular, and axillary nodes normal. No abnormal inguinal nodes palpated Neurologic: Grossly normal   Pelvic: External genitalia:  no lesions              Urethra:   normal appearing urethra with no masses, tenderness or lesions              Bartholins and Skenes: normal                 Vagina: normal appearing vagina with mild atrophy, no discharge, no lesions              Cervix: no lesions               Bimanual Exam:  Uterus:  normal size, contour, position, consistency, mobility, non-tender              Adnexa: no mass, fullness, tenderness               Rectovaginal: Confirms               Anus:  normal sphincter tone, no lesions  Chaperone was present for exam.  Tyrer-Cuzick breast cancer assessment was done. Her lifetime risk of breast cancer is 19.1%  A:  Well Woman with normal exam  Perimenopause, mild symptoms  No cycle since 2/18, she will call with bleeding  Just based on her history and her family history her lifetime risk of breast cancer is 19.1%. This doesn't take into account her dense breasts or current exam  Stable lump in the right breast, new lump in the left breast at 3 o'clock  P:   She has established care with a primary, will do her lab work there  Diagnostic breast imaging attention right breast at 3 o'clock  F/U breast check in 6 weeks  Colonoscopy UTD  Discussed breast self awareness, her breasts are very dense and she finds it difficult to examine  Discussed calcium and vit D intake

## 2016-10-31 NOTE — Progress Notes (Signed)
Patient scheduled while in office. Spoke with [name] at [facility]. Patient scheduled for left breast diagnostic MMG and [place] on 11/01/16 at 3pm. Patient scheduled for 6 week breast recheck on 12/19/16 at 8:15am with Dr. Talbert Nan. Patient is agreeable to dates and times.

## 2016-10-31 NOTE — Patient Instructions (Signed)
EXERCISE AND DIET:  We recommended that you start or continue a regular exercise program for good health. Regular exercise means any activity that makes your heart beat faster and makes you sweat.  We recommend exercising at least 30 minutes per day at least 3 days a week, preferably 4 or 5.  We also recommend a diet low in fat and sugar.  Inactivity, poor dietary choices and obesity can cause diabetes, heart attack, stroke, and kidney damage, among others.    ALCOHOL AND SMOKING:  Women should limit their alcohol intake to no more than 7 drinks/beers/glasses of wine (combined, not each!) per week. Moderation of alcohol intake to this level decreases your risk of breast cancer and liver damage. And of course, no recreational drugs are part of a healthy lifestyle.  And absolutely no smoking or even second hand smoke. Most people know smoking can cause heart and lung diseases, but did you know it also contributes to weakening of your bones? Aging of your skin?  Yellowing of your teeth and nails?  CALCIUM AND VITAMIN D:  Adequate intake of calcium and Vitamin D are recommended.  The recommendations for exact amounts of these supplements seem to change often, but generally speaking 600 mg of calcium (either carbonate or citrate) and 800 units of Vitamin D per day seems prudent. Certain women may benefit from higher intake of Vitamin D.  If you are among these women, your doctor will have told you during your visit.    PAP SMEARS:  Pap smears, to check for cervical cancer or precancers,  have traditionally been done yearly, although recent scientific advances have shown that most women can have pap smears less often.  However, every woman still should have a physical exam from her gynecologist every year. It will include a breast check, inspection of the vulva and vagina to check for abnormal growths or skin changes, a visual exam of the cervix, and then an exam to evaluate the size and shape of the uterus and  ovaries.  And after 53 years of age, a rectal exam is indicated to check for rectal cancers. We will also provide age appropriate advice regarding health maintenance, like when you should have certain vaccines, screening for sexually transmitted diseases, bone density testing, colonoscopy, mammograms, etc.   MAMMOGRAMS:  All women over 40 years old should have a yearly mammogram. Many facilities now offer a "3D" mammogram, which may cost around $50 extra out of pocket. If possible,  we recommend you accept the option to have the 3D mammogram performed.  It both reduces the number of women who will be called back for extra views which then turn out to be normal, and it is better than the routine mammogram at detecting truly abnormal areas.    COLONOSCOPY:  Colonoscopy to screen for colon cancer is recommended for all women at age 50.  We know, you hate the idea of the prep.  We agree, BUT, having colon cancer and not knowing it is worse!!  Colon cancer so often starts as a polyp that can be seen and removed at colonscopy, which can quite literally save your life!  And if your first colonoscopy is normal and you have no family history of colon cancer, most women don't have to have it again for 10 years.  Once every ten years, you can do something that may end up saving your life, right?  We will be happy to help you get it scheduled when you are ready.    Be sure to check your insurance coverage so you understand how much it will cost.  It may be covered as a preventative service at no cost, but you should check your particular policy.      Breast Self-Awareness Breast self-awareness means being familiar with how your breasts look and feel. It involves checking your breasts regularly and reporting any changes to your health care provider. Practicing breast self-awareness is important. A change in your breasts can be a sign of a serious medical problem. Being familiar with how your breasts look and feel allows  you to find any problems early, when treatment is more likely to be successful. All women should practice breast self-awareness, including women who have had breast implants. How to do a breast self-exam One way to learn what is normal for your breasts and whether your breasts are changing is to do a breast self-exam. To do a breast self-exam: Look for Changes  1. Remove all the clothing above your waist. 2. Stand in front of a mirror in a room with good lighting. 3. Put your hands on your hips. 4. Push your hands firmly downward. 5. Compare your breasts in the mirror. Look for differences between them (asymmetry), such as: ? Differences in shape. ? Differences in size. ? Puckers, dips, and bumps in one breast and not the other. 6. Look at each breast for changes in your skin, such as: ? Redness. ? Scaly areas. 7. Look for changes in your nipples, such as: ? Discharge. ? Bleeding. ? Dimpling. ? Redness. ? A change in position. Feel for Changes  Carefully feel your breasts for lumps and changes. It is best to do this while lying on your back on the floor and again while sitting or standing in the shower or tub with soapy water on your skin. Feel each breast in the following way:  Place the arm on the side of the breast you are examining above your head.  Feel your breast with the other hand.  Start in the nipple area and make  inch (2 cm) overlapping circles to feel your breast. Use the pads of your three middle fingers to do this. Apply light pressure, then medium pressure, then firm pressure. The light pressure will allow you to feel the tissue closest to the skin. The medium pressure will allow you to feel the tissue that is a little deeper. The firm pressure will allow you to feel the tissue close to the ribs.  Continue the overlapping circles, moving downward over the breast until you feel your ribs below your breast.  Move one finger-width toward the center of the body.  Continue to use the  inch (2 cm) overlapping circles to feel your breast as you move slowly up toward your collarbone.  Continue the up and down exam using all three pressures until you reach your armpit.  Write Down What You Find  Write down what is normal for each breast and any changes that you find. Keep a written record with breast changes or normal findings for each breast. By writing this information down, you do not need to depend only on memory for size, tenderness, or location. Write down where you are in your menstrual cycle, if you are still menstruating. If you are having trouble noticing differences in your breasts, do not get discouraged. With time you will become more familiar with the variations in your breasts and more comfortable with the exam. How often should I examine my breasts? Examine   your breasts every month. If you are breastfeeding, the best time to examine your breasts is after a feeding or after using a breast pump. If you menstruate, the best time to examine your breasts is 5-7 days after your period is over. During your period, your breasts are lumpier, and it may be more difficult to notice changes. When should I see my health care provider? See your health care provider if you notice:  A change in shape or size of your breasts or nipples.  A change in the skin of your breast or nipples, such as a reddened or scaly area.  Unusual discharge from your nipples.  A lump or thick area that was not there before.  Pain in your breasts.  Anything that concerns you.  This information is not intended to replace advice given to you by your health care provider. Make sure you discuss any questions you have with your health care provider. Document Released: 04/02/2005 Document Revised: 09/08/2015 Document Reviewed: 02/20/2015 Elsevier Interactive Patient Education  2018 Elsevier Inc.  

## 2016-12-13 ENCOUNTER — Telehealth: Payer: Self-pay | Admitting: Obstetrics and Gynecology

## 2016-12-13 NOTE — Telephone Encounter (Signed)
DR/CX/SURGERY/ LMTCB//Call to patient about canceled appointment for 6 week breast re check.Madelaine Bhat

## 2016-12-19 ENCOUNTER — Ambulatory Visit: Payer: Self-pay | Admitting: Obstetrics and Gynecology

## 2016-12-25 NOTE — Telephone Encounter (Signed)
Left message to call Michelle Anthony at [phone]. 

## 2016-12-26 ENCOUNTER — Encounter: Payer: Self-pay | Admitting: Obstetrics and Gynecology

## 2017-01-04 NOTE — Telephone Encounter (Signed)
Left message to call Manasvini Whatley at [phone]. 

## 2017-01-14 NOTE — Telephone Encounter (Signed)
Letter written and to Dr.Jertson. Unable to reach patient.

## 2017-01-15 NOTE — Telephone Encounter (Signed)
Signed letter mailed to patient's home address on file. Encounter closed.

## 2017-11-01 DIAGNOSIS — M1810 Unilateral primary osteoarthritis of first carpometacarpal joint, unspecified hand: Secondary | ICD-10-CM | POA: Insufficient documentation

## 2017-11-01 DIAGNOSIS — R5383 Other fatigue: Secondary | ICD-10-CM | POA: Insufficient documentation

## 2017-11-04 ENCOUNTER — Ambulatory Visit: Payer: BLUE CROSS/BLUE SHIELD | Admitting: Obstetrics and Gynecology

## 2017-11-28 ENCOUNTER — Encounter: Payer: Self-pay | Admitting: Obstetrics and Gynecology

## 2017-12-19 ENCOUNTER — Ambulatory Visit: Payer: BLUE CROSS/BLUE SHIELD | Admitting: Obstetrics and Gynecology

## 2017-12-19 ENCOUNTER — Encounter: Payer: Self-pay | Admitting: Obstetrics and Gynecology

## 2017-12-19 ENCOUNTER — Other Ambulatory Visit: Payer: Self-pay

## 2017-12-19 VITALS — BP 112/70 | HR 62 | Ht 64.5 in | Wt 150.0 lb

## 2017-12-19 DIAGNOSIS — Z01419 Encounter for gynecological examination (general) (routine) without abnormal findings: Secondary | ICD-10-CM

## 2017-12-19 NOTE — Progress Notes (Signed)
52 y.o. G2P2 Divorced CaucasianF here for annual exam.  Last cycle in 11/18. She is having hot flashes at night, tolerable. Some vaginal dryness. She has noticed a decline in libido, tolerable.  Same partner x 3 years, he lives in [place], works for them. No dyspareunia, some dryness.  Nocturia 1-2 x a night.    In the last she has started having some joint pain, pain in the bottom of her feet.     No LMP recorded. (Menstrual status: Irregular Periods).          Sexually active: Yes.    The current method of family planning is none.    Exercising: Yes.    walk daily Smoker:  no  Health Maintenance: Pap:  10/31/2015 ASCUS, neg HR HPV, 08/26/12 WNL, Neg HR HPV History of abnormal Pap: unsure MMG:  11/06/2017 BIRAD 2 Benign Colonoscopy:  2017, q 5 years (Dad with cancer of the appendix) BMD:  never  TDaP:  2014 Gardasil: recently given to her by her primary.    reports that she quit smoking about 35 years ago. Her smoking use included cigarettes. She has never used smokeless tobacco. She reports that she drinks about 5.0 standard drinks of alcohol per week. She reports that she does not use drugs. She works for [employer].  2 grown kids  Past Medical History:  Diagnosis Date  . Erythema nodosum   . Frequent UTI   . STD (sexually transmitted disease)    H/O HSV II    Past Surgical History:  Procedure Laterality Date  . BOTOX INJECTION      Current Outpatient Medications  Medication Sig Dispense Refill  . Tretinoin (RETIN-A EX) Apply topically.     No current facility-administered medications for this visit.     Family History  Problem Relation Age of Onset  . Thyroid disease Mother   . Osteopenia Mother   . Thyroid disease Sister   . Cancer Father 39       appendiceal cancer--stage IV  . Diabetes Father 70       thin  . Thyroid disease Maternal Grandmother   . Cancer Paternal Grandfather        liver cancer  . Breast cancer Maternal Aunt   . Breast cancer Paternal  Aunt   . Diabetes Paternal Aunt   . Diabetes Paternal Uncle     Review of Systems  Constitutional: Negative.   HENT: Negative.   Eyes: Negative.   Respiratory: Negative.   Cardiovascular: Negative.   Gastrointestinal: Negative.   Endocrine: Negative.   Genitourinary:       Loss of sexual interest Pain or bleeding with intercourse nocturia  Musculoskeletal: Positive for arthralgias and myalgias.  Skin: Negative.   Allergic/Immunologic: Negative.   Neurological: Negative.   Hematological: Negative.   Psychiatric/Behavioral: Negative.   All other systems reviewed and are negative.   Exam:   There were no vitals taken for this visit.  Weight change: @WEIGHTCHANGE @ Height:      Ht Readings from Last 3 Encounters:  10/31/16 5\' 5"  (1.651 m)  10/31/15 5\' 5"  (1.651 m)  10/11/14 5' 4.5" (1.638 m)    General appearance: alert, cooperative and appears stated age Head: Normocephalic, without obvious abnormality, atraumatic Neck: no adenopathy, supple, symmetrical, trachea midline and thyroid normal to inspection and palpation Lungs: clear to auscultation bilaterally Cardiovascular: regular rate and rhythm Breasts: normal appearance, no masses or tenderness Abdomen: soft, non-tender; non distended,  no masses,  no organomegaly Extremities: extremities  normal, atraumatic, no cyanosis or edema Skin: Skin color, texture, turgor normal. No rashes or lesions Lymph nodes: Cervical, supraclavicular, and axillary nodes normal. No abnormal inguinal nodes palpated Neurologic: Grossly normal   Pelvic: External genitalia:  no lesions              Urethra:  normal appearing urethra with no masses, tenderness or lesions              Bartholins and Skenes: normal                 Vagina: mildly atrophic appearing vagina with normal color and discharge, no lesions              Cervix: no lesions               Bimanual Exam:  Uterus:  normal size, contour, position, consistency, mobility,  non-tender              Adnexa: no mass, fullness, tenderness               Rectovaginal: Confirms               Anus:  normal sphincter tone, no lesions  Chaperone was present for exam.  A:  Well Woman with normal exam  Slight vaginal dryness, discussed lubrication  P:   No pap this year  Mammogram UTD  Labs with primary  Colonoscopy is UTD  Discussed breast self exam  Discussed calcium and vit D intake

## 2017-12-19 NOTE — Patient Instructions (Signed)
EXERCISE AND DIET:  We recommended that you start or continue a regular exercise program for good health. Regular exercise means any activity that makes your heart beat faster and makes you sweat.  We recommend exercising at least 30 minutes per day at least 3 days a week, preferably 4 or 5.  We also recommend a diet low in fat and sugar.  Inactivity, poor dietary choices and obesity can cause diabetes, heart attack, stroke, and kidney damage, among others.    ALCOHOL AND SMOKING:  Women should limit their alcohol intake to no more than 7 drinks/beers/glasses of wine (combined, not each!) per week. Moderation of alcohol intake to this level decreases your risk of breast cancer and liver damage. And of course, no recreational drugs are part of a healthy lifestyle.  And absolutely no smoking or even second hand smoke. Most people know smoking can cause heart and lung diseases, but did you know it also contributes to weakening of your bones? Aging of your skin?  Yellowing of your teeth and nails?  CALCIUM AND VITAMIN D:  Adequate intake of calcium and Vitamin D are recommended.  The recommendations for exact amounts of these supplements seem to change often, but generally speaking 600 mg of calcium (either carbonate or citrate) and 800 units of Vitamin D per day seems prudent. Certain women may benefit from higher intake of Vitamin D.  If you are among these women, your doctor will have told you during your visit.    PAP SMEARS:  Pap smears, to check for cervical cancer or precancers,  have traditionally been done yearly, although recent scientific advances have shown that most women can have pap smears less often.  However, every woman still should have a physical exam from her gynecologist every year. It will include a breast check, inspection of the vulva and vagina to check for abnormal growths or skin changes, a visual exam of the cervix, and then an exam to evaluate the size and shape of the uterus and  ovaries.  And after 54 years of age, a rectal exam is indicated to check for rectal cancers. We will also provide age appropriate advice regarding health maintenance, like when you should have certain vaccines, screening for sexually transmitted diseases, bone density testing, colonoscopy, mammograms, etc.   MAMMOGRAMS:  All women over 40 years old should have a yearly mammogram. Many facilities now offer a "3D" mammogram, which may cost around $50 extra out of pocket. If possible,  we recommend you accept the option to have the 3D mammogram performed.  It both reduces the number of women who will be called back for extra views which then turn out to be normal, and it is better than the routine mammogram at detecting truly abnormal areas.    COLONOSCOPY:  Colonoscopy to screen for colon cancer is recommended for all women at age 50.  We know, you hate the idea of the prep.  We agree, BUT, having colon cancer and not knowing it is worse!!  Colon cancer so often starts as a polyp that can be seen and removed at colonscopy, which can quite literally save your life!  And if your first colonoscopy is normal and you have no family history of colon cancer, most women don't have to have it again for 10 years.  Once every ten years, you can do something that may end up saving your life, right?  We will be happy to help you get it scheduled when you are ready.    Be sure to check your insurance coverage so you understand how much it will cost.  It may be covered as a preventative service at no cost, but you should check your particular policy.      Breast Self-Awareness Breast self-awareness means being familiar with how your breasts look and feel. It involves checking your breasts regularly and reporting any changes to your health care provider. Practicing breast self-awareness is important. A change in your breasts can be a sign of a serious medical problem. Being familiar with how your breasts look and feel allows  you to find any problems early, when treatment is more likely to be successful. All women should practice breast self-awareness, including women who have had breast implants. How to do a breast self-exam One way to learn what is normal for your breasts and whether your breasts are changing is to do a breast self-exam. To do a breast self-exam: Look for Changes  1. Remove all the clothing above your waist. 2. Stand in front of a mirror in a room with good lighting. 3. Put your hands on your hips. 4. Push your hands firmly downward. 5. Compare your breasts in the mirror. Look for differences between them (asymmetry), such as: ? Differences in shape. ? Differences in size. ? Puckers, dips, and bumps in one breast and not the other. 6. Look at each breast for changes in your skin, such as: ? Redness. ? Scaly areas. 7. Look for changes in your nipples, such as: ? Discharge. ? Bleeding. ? Dimpling. ? Redness. ? A change in position. Feel for Changes  Carefully feel your breasts for lumps and changes. It is best to do this while lying on your back on the floor and again while sitting or standing in the shower or tub with soapy water on your skin. Feel each breast in the following way:  Place the arm on the side of the breast you are examining above your head.  Feel your breast with the other hand.  Start in the nipple area and make  inch (2 cm) overlapping circles to feel your breast. Use the pads of your three middle fingers to do this. Apply light pressure, then medium pressure, then firm pressure. The light pressure will allow you to feel the tissue closest to the skin. The medium pressure will allow you to feel the tissue that is a little deeper. The firm pressure will allow you to feel the tissue close to the ribs.  Continue the overlapping circles, moving downward over the breast until you feel your ribs below your breast.  Move one finger-width toward the center of the body.  Continue to use the  inch (2 cm) overlapping circles to feel your breast as you move slowly up toward your collarbone.  Continue the up and down exam using all three pressures until you reach your armpit.  Write Down What You Find  Write down what is normal for each breast and any changes that you find. Keep a written record with breast changes or normal findings for each breast. By writing this information down, you do not need to depend only on memory for size, tenderness, or location. Write down where you are in your menstrual cycle, if you are still menstruating. If you are having trouble noticing differences in your breasts, do not get discouraged. With time you will become more familiar with the variations in your breasts and more comfortable with the exam. How often should I examine my breasts? Examine   your breasts every month. If you are breastfeeding, the best time to examine your breasts is after a feeding or after using a breast pump. If you menstruate, the best time to examine your breasts is 5-7 days after your period is over. During your period, your breasts are lumpier, and it may be more difficult to notice changes. When should I see my health care provider? See your health care provider if you notice:  A change in shape or size of your breasts or nipples.  A change in the skin of your breast or nipples, such as a reddened or scaly area.  Unusual discharge from your nipples.  A lump or thick area that was not there before.  Pain in your breasts.  Anything that concerns you.  This information is not intended to replace advice given to you by your health care provider. Make sure you discuss any questions you have with your health care provider. Document Released: 04/02/2005 Document Revised: 09/08/2015 Document Reviewed: 02/20/2015 Elsevier Interactive Patient Education  2018 [employer]  

## 2018-11-26 ENCOUNTER — Encounter: Payer: Self-pay | Admitting: Obstetrics and Gynecology

## 2018-12-16 DIAGNOSIS — M7500 Adhesive capsulitis of unspecified shoulder: Secondary | ICD-10-CM | POA: Insufficient documentation

## 2018-12-25 ENCOUNTER — Ambulatory Visit: Payer: BLUE CROSS/BLUE SHIELD | Admitting: Obstetrics and Gynecology

## 2019-12-14 NOTE — Progress Notes (Signed)
54 y.o. G2P2 Divorced White or Caucasian Not Hispanic or Latino female here for annual exam. Patient was told she has breast cancer in her right breast yesterday.  Report has been requested from [facility].   She has also has a smell to her urine. She is also having low energy and low focus. This urine smell started in May. No other bladder c/o.   No cycle since 11/19, no bleeding. Tolerable vasomotor symptoms. Energy level is low, focus is down. Not sleeping well. She doesn't thinks she snores.   Still with the same partner for 5 years. He is in [place], he will be moving out [place] for work. Her family is here. Daughter is 67 here, son is 71 lives with her.   She will be seen by the breast cancer team next Wednesday.     Patient's last menstrual period was 03/11/2018.          Sexually active: Yes.    The current method of family planning is post menopausal status.    Exercising: Yes.    Walking and stretching  Smoker:  no  Health Maintenance: Pap:  10/31/2015 ASCUS, neg HR HPV,08/26/12 WNL, Neg HR HPV  History of abnormal Pap:  unsure MMG:  11/26/18 Bi-rads 1 neg  BMD:   Never  Colonoscopy: 2017 every 5 years  TDaP:  08/25/12 Gardasil: none    reports that she quit smoking about 37 years ago. Her smoking use included cigarettes. She has never used smokeless tobacco. She reports current alcohol use of about 5.0 standard drinks of alcohol per week. She reports that she does not use drugs. She works for [employer]. 2 grown kids  Past Medical History:  Diagnosis Date  . Erythema nodosum   . Frequent UTI   . Frozen shoulder   . STD (sexually transmitted disease)    H/O HSV II    Past Surgical History:  Procedure Laterality Date  . BOTOX INJECTION      Current Outpatient Medications  Medication Sig Dispense Refill  . COLLAGEN PO Apply topically daily.    . Multiple Vitamin (MULTIVITAMIN) tablet Take 1 tablet by mouth daily.    . Tretinoin (RETIN-A EX) Apply topically.     No current  facility-administered medications for this visit.    Family History  Problem Relation Age of Onset  . Thyroid disease Mother   . Osteopenia Mother   . Thyroid disease Sister   . Cancer Father 27       appendiceal cancer--stage IV  . Diabetes Father 53       thin  . Thyroid disease Maternal Grandmother   . Breast cancer Maternal Aunt 75  . Breast cancer Paternal Aunt 26  . Diabetes Paternal Aunt   . Diabetes Paternal Uncle   . Cancer Paternal Grandmother 58       died of liver cancer at 60  . Breast cancer Cousin 14       M 1st cousin, died at 71    Review of Systems  All other systems reviewed and are negative.   Exam:   BP 124/68   Pulse 70   Ht 5' 4.75" (1.645 m)   Wt 145 lb (65.8 kg)   LMP 03/11/2018   SpO2 98%   BMI 24.32 kg/m   Weight change: @WEIGHTCHANGE @ Height:   Height: 5' 4.75" (164.5 cm)  Ht Readings from Last 3 Encounters:  12/16/19 5' 4.75" (1.645 m)  12/19/17 5' 4.5" (1.638 m)  10/31/16 5\' 5"  (  1.651 m)    General appearance: alert, cooperative and appears stated age Head: Normocephalic, without obvious abnormality, atraumatic Neck: no adenopathy, supple, symmetrical, trachea midline and thyroid normal to inspection and palpation Lungs: clear to auscultation bilaterally Cardiovascular: regular rate and rhythm Breasts: evidence of biopsy in right breast with bruising. There is a mass felt in the upper outer part of her right breast.  Abdomen: soft, non-tender; non distended,  no masses,  no organomegaly Extremities: extremities normal, atraumatic, no cyanosis or edema Skin: Skin color, texture, turgor normal. No rashes or lesions Lymph nodes: Cervical, supraclavicular, and axillary nodes normal. No abnormal inguinal nodes palpated Neurologic: Grossly normal   Pelvic: External genitalia:  no lesions              Urethra:  normal appearing urethra with no masses, tenderness or lesions              Bartholins and Skenes: normal                  Vagina: normal appearing vagina with normal color and discharge, no lesions              Cervix: no lesions               Bimanual Exam:  Uterus:  normal size, contour, position, consistency, mobility, non-tender              Adnexa: no mass, fullness, tenderness               Rectovaginal: Confirms               Anus:  normal sphincter tone, no lesions  Shanon Petty chaperoned for the exam.  A:  Well Woman with normal exam  Some vasomotor symptoms, not sleeping well  Just diagnosed with breast cancer, handling it very well  Fatigue  P:   She will try an over the counter sleep aid, if not helping can try Gabapentin  Has f/u with breast team next week  Send urine for ua, c&s  Discussed breast self exam  Discussed calcium and vit D intake  Colonoscopy due in 1/22

## 2019-12-16 ENCOUNTER — Other Ambulatory Visit: Payer: Self-pay

## 2019-12-16 ENCOUNTER — Telehealth: Payer: Self-pay | Admitting: Hematology and Oncology

## 2019-12-16 ENCOUNTER — Other Ambulatory Visit (HOSPITAL_COMMUNITY)
Admission: RE | Admit: 2019-12-16 | Discharge: 2019-12-16 | Disposition: A | Discharge status: Home / Self Care | Payer: BC Managed Care – PPO | Source: Ambulatory Visit | Attending: Obstetrics and Gynecology | Admitting: Obstetrics and Gynecology

## 2019-12-16 ENCOUNTER — Encounter: Payer: Self-pay | Admitting: *Deleted

## 2019-12-16 ENCOUNTER — Encounter: Payer: Self-pay | Admitting: Obstetrics and Gynecology

## 2019-12-16 ENCOUNTER — Ambulatory Visit: Payer: BC Managed Care – PPO | Admitting: Obstetrics and Gynecology

## 2019-12-16 VITALS — BP 124/68 | HR 70 | Ht 64.75 in | Wt 145.0 lb

## 2019-12-16 DIAGNOSIS — R928 Other abnormal and inconclusive findings on diagnostic imaging of breast: Secondary | ICD-10-CM | POA: Insufficient documentation

## 2019-12-16 DIAGNOSIS — Z01419 Encounter for gynecological examination (general) (routine) without abnormal findings: Secondary | ICD-10-CM | POA: Diagnosis not present

## 2019-12-16 DIAGNOSIS — Z170 Estrogen receptor positive status [ER+]: Secondary | ICD-10-CM

## 2019-12-16 DIAGNOSIS — R8290 Unspecified abnormal findings in urine: Secondary | ICD-10-CM

## 2019-12-16 DIAGNOSIS — Z124 Encounter for screening for malignant neoplasm of cervix: Secondary | ICD-10-CM | POA: Insufficient documentation

## 2019-12-16 DIAGNOSIS — R5383 Other fatigue: Secondary | ICD-10-CM | POA: Diagnosis not present

## 2019-12-16 DIAGNOSIS — Z0000 Encounter for general adult medical examination without abnormal findings: Secondary | ICD-10-CM | POA: Diagnosis not present

## 2019-12-16 DIAGNOSIS — C50411 Malignant neoplasm of upper-outer quadrant of right female breast: Secondary | ICD-10-CM | POA: Insufficient documentation

## 2019-12-16 DIAGNOSIS — Z853 Personal history of malignant neoplasm of breast: Secondary | ICD-10-CM

## 2019-12-16 LAB — POCT URINALYSIS DIPSTICK
Appearance: NEGATIVE
Bilirubin, UA: NEGATIVE
Blood, UA: NEGATIVE
Glucose, UA: NEGATIVE
Ketones, UA: NEGATIVE
Leukocytes, UA: NEGATIVE
Nitrite, UA: NEGATIVE
Protein, UA: NEGATIVE
Urobilinogen, UA: NEGATIVE E.U./dL — AB
pH, UA: 6 (ref 5.0–8.0)

## 2019-12-16 NOTE — Patient Instructions (Signed)

## 2019-12-16 NOTE — Telephone Encounter (Signed)
Spoke to patient to confirm morning [facility] appointment on 9/8, explained how the day will go and to expect a call from surgeons office, packet will be sent to patient from [facility]

## 2019-12-17 LAB — CBC
Hematocrit: 41.9 % (ref 34.0–46.6)
Hemoglobin: 13.9 g/dL (ref 11.1–15.9)
MCH: 31 pg (ref 26.6–33.0)
MCHC: 33.2 g/dL (ref 31.5–35.7)
MCV: 94 fL (ref 79–97)
Platelets: 261 10*3/uL (ref 150–450)
RBC: 4.48 x10E6/uL (ref 3.77–5.28)
RDW: 12 % (ref 11.7–15.4)
WBC: 7.5 10*3/uL (ref 3.4–10.8)

## 2019-12-17 LAB — COMPREHENSIVE METABOLIC PANEL
ALT: 10 IU/L (ref 0–32)
AST: 13 IU/L (ref 0–40)
Albumin/Globulin Ratio: 2 (ref 1.2–2.2)
Albumin: 4.7 g/dL (ref 3.8–4.9)
Alkaline Phosphatase: 72 IU/L (ref 48–121)
BUN/Creatinine Ratio: 29 — ABNORMAL HIGH (ref 9–23)
BUN: 21 mg/dL (ref 6–24)
Bilirubin Total: <0.2 mg/dL (ref 0.0–1.2)
CO2: 26 mmol/L (ref 20–29)
Calcium: 9.8 mg/dL (ref 8.7–10.2)
Chloride: 101 mmol/L (ref 96–106)
Creatinine, Ser: 0.73 mg/dL (ref 0.57–1.00)
GFR calc Af Amer: 107 mL/min/{1.73_m2} (ref >59)
GFR calc non Af Amer: 93 mL/min/{1.73_m2} (ref >59)
Globulin, Total: 2.4 g/dL (ref 1.5–4.5)
Glucose: 87 mg/dL (ref 65–99)
Potassium: 4.1 mmol/L (ref 3.5–5.2)
Sodium: 141 mmol/L (ref 134–144)
Total Protein: 7.1 g/dL (ref 6.0–8.5)

## 2019-12-17 LAB — LIPID PANEL
Chol/HDL Ratio: 3.9 ratio (ref 0.0–4.4)
Cholesterol, Total: 261 mg/dL — ABNORMAL HIGH (ref 100–199)
HDL: 67 mg/dL (ref >39)
LDL Chol Calc (NIH): 176 mg/dL — ABNORMAL HIGH (ref 0–99)
Triglycerides: 102 mg/dL (ref 0–149)
VLDL Cholesterol Cal: 18 mg/dL (ref 5–40)

## 2019-12-17 LAB — URINALYSIS, MICROSCOPIC ONLY
Bacteria, UA: NONE SEEN
Casts: NONE SEEN /lpf
Epithelial Cells (non renal): NONE SEEN /hpf (ref 0–10)
RBC, Urine: NONE SEEN /hpf (ref 0–2)
WBC, UA: NONE SEEN /hpf (ref 0–5)

## 2019-12-17 LAB — URINE CULTURE

## 2019-12-17 LAB — TSH: TSH: 0.851 u[IU]/mL (ref 0.450–4.500)

## 2019-12-18 LAB — CYTOLOGY - PAP
Comment: NEGATIVE
Diagnosis: NEGATIVE
High risk HPV: NEGATIVE

## 2019-12-22 NOTE — Progress Notes (Signed)
Radiation Oncology         [phone] ________________________________  Multidisciplinary Breast Oncology Clinic [facility]) Initial Outpatient Consultation  Name: Michelle Anthony MRN: [id number]  Date: 12/23/2019  DOB: June 26, 1965  QQ:[id number], No Pcp Per  Harriette Bouillon, MD   REFERRING PHYSICIAN: Harriette Bouillon, MD  DIAGNOSIS: The encounter diagnosis was Malignant neoplasm of upper-outer quadrant of right breast in female, estrogen receptor positive (HCC).  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 Malignant neoplasm of upper-outer quadrant of right breast in female, estrogen receptor positive (HCC)  C50.411    Z17.0     HISTORY OF PRESENT ILLNESS::Michelle Anthony is a 54 y.o. female who is presenting to the office today for evaluation of her newly diagnosed breast cancer. She is accompanied by her daughter. She is doing well overall.   She had routine screening mammography on 12/02/2019 that showed a possible developing focal asymmetry in the right breast that was indeterminate. She then underwent a unilateral diagnostic mammography with tomography and right breast ultrasonography at [facility] on 12/10/2019 that showed a 2.9 x 2.5 x 1.8 cm irregular mass in the right breast at the 9 o'clock position at middle depth that was highly suggestive of malignancy. There were two additional masses in the right breast at the 10 o'clock and 8 o'clock positions 3 cm from the nipple that were suspicious for malignancy. Finally, there was a 1.1 x 1.3 x 0.5 cm lobulated mass in the right breast at the 10 o'clock position at posterior depth that had differential diagnoses of an enlarged lymph node versus malignancy.  Biopsy on 12/14/2019 showed grade 2 invasive mammary carcinoma with necrosis and mammary carcinoma in-situ of the mass at 9 o'clock. The mass at 8 o'clock was benign with focally dilated ducts and the mass at  10 o'clock showed focal lobular neoplasia (atypical lobular hyperplasia). E-cadherin was negative, supporting a lobular origin. Prognostic indicators were significant for estrogen receptor, 100% positive and progesterone receptor, 2% positive, both with strong staining intensities. Proliferation marker Ki67 at 20%. HER2 positive.  Menarche: 54 years old Age at first live birth: 54 years old GP: 2 LMP: 03/11/2018 Contraceptive: Yes, [phone] and 2005-2015 HRT: No   The patient was referred today for presentation in the multidisciplinary conference.  Radiology studies and pathology slides were presented there for review and discussion of treatment options.  A consensus was discussed regarding potential next steps.  PREVIOUS RADIATION THERAPY: No  PAST MEDICAL HISTORY:  Past Medical History:  Diagnosis Date  . Erythema nodosum   . Frequent UTI   . Frozen shoulder   . STD (sexually transmitted disease)    H/O HSV II    PAST SURGICAL HISTORY: Past Surgical History:  Procedure Laterality Date  . BOTOX INJECTION      FAMILY HISTORY:  Family History  Problem Relation Age of Onset  . Thyroid disease Mother   . Osteopenia Mother   . Thyroid disease Sister   . Cancer Father 68       appendiceal cancer--stage IV  . Diabetes Father 85       thin  . Thyroid disease Maternal Grandmother   . Breast cancer Maternal Aunt 75  . Breast cancer Paternal Aunt 55  . Diabetes Paternal Aunt   . Diabetes Paternal Uncle   . Cancer Paternal Grandmother 29       died of  liver cancer at 90  . Breast cancer Cousin 27       M 1st cousin, died at 75    SOCIAL HISTORY:  Social History   Socioeconomic History  . Marital status: Divorced    Spouse name: Not on file  . Number of children: Not on file  . Years of education: Not on file  . Highest education level: Not on file  Occupational History  . Not on file  Tobacco Use  . Smoking status: Former Smoker    Types: Cigarettes    Quit date:  08/26/1982    Years since quitting: 37.3  . Smokeless tobacco: Never Used  Substance and Sexual Activity  . Alcohol use: Yes    Alcohol/week: 5.0 standard drinks    Types: 5 Glasses of wine per week  . Drug use: No  . Sexual activity: Yes    Partners: Male    Birth control/protection: Other-see comments    Comment: vasectomy  Other Topics Concern  . Not on file  Social History Narrative  . Not on file   Social Determinants of Health   Financial Resource Strain: Low Risk   . Difficulty of Paying Living Expenses: Not hard at all  Food Insecurity: No Food Insecurity  . Worried About Charity fundraiser in the Last Year: Never true  . Ran Out of Food in the Last Year: Never true  Transportation Needs: No Transportation Needs  . Lack of Transportation (Medical): No  . Lack of Transportation (Non-Medical): No  Physical Activity:   . Days of Exercise per Week: Not on file  . Minutes of Exercise per Session: Not on file  Stress:   . Feeling of Stress : Not on file  Social Connections:   . Frequency of Communication with Friends and Family: Not on file  . Frequency of Social Gatherings with Friends and Family: Not on file  . Attends Religious Services: Not on file  . Active Member of Clubs or Organizations: Not on file  . Attends Archivist Meetings: Not on file  . Marital Status: Not on file    ALLERGIES: No Known Allergies  MEDICATIONS:  Current Outpatient Medications  Medication Sig Dispense Refill  . COLLAGEN PO Apply topically daily.    [name] dexamethasone (DECADRON) 4 MG tablet Take 1 tablet (4 mg total) by mouth daily. Take 1 tablet day before chemo and 1 tablet 1 day after chemo with food 12 tablet 0  . lidocaine-prilocaine (EMLA) cream Apply to affected area once 30 g 3  . LORazepam (ATIVAN) 0.5 MG tablet Take 1 tablet (0.5 mg total) by mouth every 6 (six) hours as needed for sleep. 30 tablet 0  . Multiple Vitamin (MULTIVITAMIN) tablet Take 1 tablet by mouth  daily.    . ondansetron (ZOFRAN) 8 MG tablet Take 1 tablet (8 mg total) by mouth 2 (two) times daily as needed (Nausea or vomiting). Start on the third day after chemotherapy. 30 tablet 1  . prochlorperazine (COMPAZINE) 10 MG tablet Take 1 tablet (10 mg total) by mouth every 6 (six) hours as needed (Nausea or vomiting). 30 tablet 1  . Tretinoin (RETIN-A EX) Apply topically.     No current facility-administered medications for this encounter.    REVIEW OF SYSTEMS: A 10+ POINT REVIEW OF SYSTEMS WAS OBTAINED including neurology, dermatology, psychiatry, cardiac, respiratory, lymph, extremities, GI, GU, musculoskeletal, constitutional, reproductive, HEENT. On the provided form, she reports loss of sleep, fatigue, wearing glasses, gum disease, lump  and pain in right breast, hot flashes, forgetfulness, and plantar fasciitis. She denies chest pain, palpitations, cough, shortness of breath, nausea, vomiting, abdominal pain, rash, and any other symptoms.    PHYSICAL EXAM:   Vitals with BMI 12/23/2019  Height $Remov'5\' 5"'lXIRCr$   Weight 146 lbs 2 oz  BMI 34.19  Systolic 379  Diastolic 69  Pulse 67    Lungs are clear to auscultation bilaterally. Heart has regular rate and rhythm. No palpable cervical, supraclavicular, or axillary adenopathy. Abdomen soft, non-tender, normal bowel sounds. Left breast with no palpable mass, nipple discharge, or bleeding. Right breast with an approximately 3.0 x 4.0 cm palpable mass in the upper outer aspect with some associated bruising from the recent biopsy. No nipple discharge or bleeding.   KPS = 90  100 - Normal; no complaints; no evidence of disease. 90   - Able to carry on normal activity; minor signs or symptoms of disease. 80   - Normal activity with effort; some signs or symptoms of disease. 7   - Cares for self; unable to carry on normal activity or to do active work. 60   - Requires occasional assistance, but is able to care for most of his personal needs. 50   -  Requires considerable assistance and frequent medical care. 67   - Disabled; requires special care and assistance. 105   - Severely disabled; hospital admission is indicated although death not imminent. 68   - Very sick; hospital admission necessary; active supportive treatment necessary. 10   - Moribund; fatal processes progressing rapidly. 0     - Dead  Karnofsky DA, Abelmann Arden, Craver LS and [facility] [phone]) The use of the nitrogen mustards in the palliative treatment of carcinoma: with particular reference to bronchogenic carcinoma Cancer 1 634-56  LABORATORY DATA:  Lab Results  Component Value Date   WBC 5.1 12/23/2019   HGB 13.7 12/23/2019   HCT 41.9 12/23/2019   MCV 92.3 12/23/2019   PLT 258 12/23/2019   Lab Results  Component Value Date   NA 143 12/23/2019   K 3.8 12/23/2019   CL 105 12/23/2019   CO2 30 12/23/2019   Lab Results  Component Value Date   ALT 14 12/23/2019   AST 13 (L) 12/23/2019   ALKPHOS 62 12/23/2019   BILITOT 0.5 12/23/2019    PULMONARY FUNCTION TEST:   Recent Review Flowsheet Data   There is no flowsheet data to display.     RADIOGRAPHY: No results found.    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 Of note, the patient did mention that she would like to see the results to her genetic testing prior to surgery because if there is a deleterious mutation, then she would likely consider a mastectomy. We discussed the general course of radiation, potential side effects, and toxicities with radiation and the patient is interested in this approach.  PLAN:  1. Genetic testing 2. MRI 3. Echocardiogram 4. Chemotherapy class 5. Neoadjuvant  chemotherapy/port 6. Chemotherapy 7. Adjuvant radiation therapy 8. Aromatase inhibitor   ------------------------------------------------  Blair Promise, PhD, MD  This document serves as a record of services personally performed by Gery Pray, MD. It was created on his behalf by Clerance Lav, a  trained medical scribe. The creation of this record is based on the scribe's personal observations and the provider's statements to them. This document has been checked and approved by the attending provider.

## 2019-12-22 NOTE — Progress Notes (Signed)
[facility] CONSULT NOTE  Patient Care Team: Patient, No Pcp Per as PCP - General (General Practice) Rockwell Germany, RN as Oncology Nurse Navigator Mauro Kaufmann, RN as Oncology Nurse Navigator Erroll Luna, MD as Consulting Physician (General Surgery) Nicholas Lose, MD as Consulting Physician (Hematology and Oncology) Gery Pray, MD as Consulting Physician (Radiation Oncology)  CHIEF COMPLAINTS/PURPOSE OF CONSULTATION:  Newly diagnosed breast cancer  HISTORY OF PRESENTING ILLNESS:  Michelle Anthony 54 y.o. female is here because of recent diagnosis of breast cancer. Screening mammogram on 12/02/19 showed a right breast asymmetry. Diagnostic mammogram on 12/10/19 showed a 2.9cm mass at the 9 o'clock position with 2 adjacent masses at the 10 and 8 o'clock positions, 0.9cm and 1.4cm respectively, and an enlarged lymph node, 1.3cm. She has a family history of breast cancer in her maternal aunt at 7, paternal aunt at 73, and maternal cousin at 34.  Biopsy revealed grade 2-3 invasive lobular cancer ER 100%, PR 2%, Ki-67 20%, HER-2 positive.  She presents to the clinic today for initial evaluation and discussion of treatment options.    I reviewed her records extensively and collaborated the history with the patient.  SUMMARY OF ONCOLOGIC HISTORY: Oncology History  Malignant neoplasm of upper-outer quadrant of right breast in female, estrogen receptor positive ([place])  12/16/2019 Initial Diagnosis   Malignant neoplasm of upper-outer quadrant of right breast in female, estrogen receptor positive [facility])      MEDICAL HISTORY:  Past Medical History:  Diagnosis Date  . Erythema nodosum   . Frequent UTI   . Frozen shoulder   . STD (sexually transmitted disease)    H/O HSV II    SURGICAL HISTORY: Past Surgical History:  Procedure Laterality Date  . BOTOX INJECTION      SOCIAL HISTORY: Social History   Socioeconomic History  . Marital status: Divorced    Spouse  name: Not on file  . Number of children: Not on file  . Years of education: Not on file  . Highest education level: Not on file  Occupational History  . Not on file  Tobacco Use  . Smoking status: Former Smoker    Types: Cigarettes    Quit date: 08/26/1982    Years since quitting: 37.3  . Smokeless tobacco: Never Used  Substance and Sexual Activity  . Alcohol use: Yes    Alcohol/week: 5.0 standard drinks    Types: 5 Glasses of wine per week  . Drug use: No  . Sexual activity: Yes    Partners: Male    Birth control/protection: Other-see comments    Comment: vasectomy  Other Topics Concern  . Not on file  Social History Narrative  . Not on file   Social Determinants of Health   Financial Resource Strain: Low Risk   . Difficulty of Paying Living Expenses: Not hard at all  Food Insecurity: No Food Insecurity  . Worried About Charity fundraiser in the Last Year: Never true  . Ran Out of Food in the Last Year: Never true  Transportation Needs: No Transportation Needs  . Lack of Transportation (Medical): No  . Lack of Transportation (Non-Medical): No  Physical Activity:   . Days of Exercise per Week: Not on file  . Minutes of Exercise per Session: Not on file  Stress:   . Feeling of Stress : Not on file  Social Connections:   . Frequency of Communication with Friends and Family: Not on file  . Frequency of  Social Gatherings with Friends and Family: Not on file  . Attends Religious Services: Not on file  . Active Member of Clubs or Organizations: Not on file  . Attends Archivist Meetings: Not on file  . Marital Status: Not on file  Intimate Partner Violence:   . Fear of Current or Ex-Partner: Not on file  . Emotionally Abused: Not on file  . Physically Abused: Not on file  . Sexually Abused: Not on file    FAMILY HISTORY: Family History  Problem Relation Age of Onset  . Thyroid disease Mother   . Osteopenia Mother   . Thyroid disease Sister   . Cancer  Father 68       appendiceal cancer--stage IV  . Diabetes Father 85       thin  . Thyroid disease Maternal Grandmother   . Breast cancer Maternal Aunt 75  . Breast cancer Paternal Aunt 11  . Diabetes Paternal Aunt   . Diabetes Paternal Uncle   . Cancer Paternal Grandmother 54       died of liver cancer at 33  . Breast cancer Cousin 39       M 1st cousin, died at 67    ALLERGIES:  has No Known Allergies.  MEDICATIONS:  Current Outpatient Medications  Medication Sig Dispense Refill  . COLLAGEN PO Apply topically daily.    . Multiple Vitamin (MULTIVITAMIN) tablet Take 1 tablet by mouth daily.    . Tretinoin (RETIN-A EX) Apply topically.     No current facility-administered medications for this visit.    REVIEW OF SYSTEMS:    All other systems were reviewed with the patient and are negative.  PHYSICAL EXAMINATION: ECOG PERFORMANCE STATUS: 1 - Symptomatic but completely ambulatory  Vitals:   12/23/19 0850  BP: 121/69  Pulse: 67  Resp: 17  Temp: (!) 96.6 F (35.9 C)  SpO2: 100%   Filed Weights   12/23/19 0850  Weight: 146 lb 1.6 oz (66.3 kg)     LABORATORY DATA:  I have reviewed the data as listed Lab Results  Component Value Date   WBC 5.1 12/23/2019   HGB 13.7 12/23/2019   HCT 41.9 12/23/2019   MCV 92.3 12/23/2019   PLT 258 12/23/2019   Lab Results  Component Value Date   NA 143 12/23/2019   K 3.8 12/23/2019   CL 105 12/23/2019   CO2 30 12/23/2019    RADIOGRAPHIC STUDIES: I have personally reviewed the radiological reports and agreed with the findings in the report.  ASSESSMENT AND PLAN:  Malignant neoplasm of upper-outer quadrant of right breast in female, estrogen receptor positive ([place]) 12/09/2019:Screening mammogram  showed a right breast asymmetry. Diagnostic mammogram showed a 2.9cm mass at the 9 o'clock position with 2 adjacent masses at the 10 and 8 o'clock positions, 0.9cm and 1.4cm respectively, and an enlarged lymph node, 1.3cm.  Biopsy  revealed grade 2-3 invasive lobular cancer with LCIS ER 100%, PR 2%, Ki-67 20%, HER-2 positive, 1.1 cm mass at 10:00: Biopsy ALH, 1.4 cm at 8:00: Biopsy benign  Pathology and radiology counseling: Discussed with the patient, the details of pathology including the type of breast cancer,the clinical staging, the significance of ER, PR and HER-2/neu receptors and the implications for treatment. After reviewing the pathology in detail, we proceeded to discuss the different treatment options between surgery, radiation, chemotherapy, antiestrogen therapies.  Recommendation based on multidisciplinary tumor board: 1. Neoadjuvant chemotherapy with TCH Perjeta 6 cycles followed by Herceptin and Perjeta versus  Kadcyla maintenance for 1 year (also could be randomized compass HER 2 trial) 2. Followed by breast conserving surgery if possible with sentinel lymph node study 3. Followed by adjuvant radiation therapy if patient had lumpectomy 4.  Followed by adjuvant antiestrogen therapy  Chemotherapy Counseling: I discussed the risks and benefits of chemotherapy including the risks of nausea/ vomiting, risk of infection from low WBC count, fatigue due to chemo or anemia, bruising or bleeding due to low platelets, mouth sores, loss/ change in taste and decreased appetite. Liver and kidney function will be monitored through out chemotherapy as abnormalities in liver and kidney function may be a side effect of treatment. Cardiac dysfunction due to Herceptin and Perjeta were discussed in detail. Risk of permanent bone marrow dysfunction due to chemo were also discussed.  Plan: 1. Port placement 2. Echocardiogram 3. Chemotherapy class 4. Breast MRI 5.  Genetics consult  Return to clinic in 2 weeks to start chemotherapy.    All questions were answered. The patient knows to call the clinic with any problems, questions or concerns.   Rulon Eisenmenger, MD, MPH 12/23/2019    I, Molly Dorshimer, am acting as scribe for  Nicholas Lose, MD.  I have reviewed the above documentation for accuracy and completeness, and I agree with the above.

## 2019-12-23 ENCOUNTER — Other Ambulatory Visit: Payer: Self-pay | Admitting: *Deleted

## 2019-12-23 ENCOUNTER — Inpatient Hospital Stay: Payer: BC Managed Care – PPO | Attending: Hematology and Oncology | Admitting: Hematology and Oncology

## 2019-12-23 ENCOUNTER — Ambulatory Visit: Payer: Self-pay | Admitting: Surgery

## 2019-12-23 ENCOUNTER — Ambulatory Visit: Payer: BC Managed Care – PPO | Attending: Surgery | Admitting: Physical Therapy

## 2019-12-23 ENCOUNTER — Encounter: Payer: Self-pay | Admitting: *Deleted

## 2019-12-23 ENCOUNTER — Other Ambulatory Visit: Payer: Self-pay

## 2019-12-23 ENCOUNTER — Ambulatory Visit
Admission: RE | Admit: 2019-12-23 | Discharge: 2019-12-23 | Disposition: A | Discharge status: Home / Self Care | Payer: BC Managed Care – PPO | Source: Ambulatory Visit | Attending: Radiation Oncology | Admitting: Radiation Oncology

## 2019-12-23 ENCOUNTER — Inpatient Hospital Stay: Payer: BC Managed Care – PPO

## 2019-12-23 ENCOUNTER — Encounter: Payer: Self-pay | Admitting: Physical Therapy

## 2019-12-23 ENCOUNTER — Inpatient Hospital Stay (HOSPITAL_BASED_OUTPATIENT_CLINIC_OR_DEPARTMENT_OTHER): Payer: BC Managed Care – PPO | Admitting: Genetic Counselor

## 2019-12-23 VITALS — BP 121/69 | HR 67 | Temp 96.6°F | Resp 17 | Ht 65.0 in | Wt 146.1 lb

## 2019-12-23 DIAGNOSIS — C50411 Malignant neoplasm of upper-outer quadrant of right female breast: Secondary | ICD-10-CM

## 2019-12-23 DIAGNOSIS — Z170 Estrogen receptor positive status [ER+]: Secondary | ICD-10-CM | POA: Insufficient documentation

## 2019-12-23 DIAGNOSIS — Z5111 Encounter for antineoplastic chemotherapy: Secondary | ICD-10-CM | POA: Diagnosis not present

## 2019-12-23 DIAGNOSIS — Z5189 Encounter for other specified aftercare: Secondary | ICD-10-CM | POA: Diagnosis not present

## 2019-12-23 DIAGNOSIS — Z79899 Other long term (current) drug therapy: Secondary | ICD-10-CM | POA: Diagnosis not present

## 2019-12-23 DIAGNOSIS — R110 Nausea: Secondary | ICD-10-CM | POA: Insufficient documentation

## 2019-12-23 DIAGNOSIS — R197 Diarrhea, unspecified: Secondary | ICD-10-CM | POA: Insufficient documentation

## 2019-12-23 DIAGNOSIS — Z803 Family history of malignant neoplasm of breast: Secondary | ICD-10-CM

## 2019-12-23 DIAGNOSIS — Z808 Family history of malignant neoplasm of other organs or systems: Secondary | ICD-10-CM

## 2019-12-23 LAB — CMP (CANCER CENTER ONLY)
ALT: 14 U/L (ref 0–44)
AST: 13 U/L — ABNORMAL LOW (ref 15–41)
Albumin: 4.1 g/dL (ref 3.5–5.0)
Alkaline Phosphatase: 62 U/L (ref 38–126)
Anion gap: 8 (ref 5–15)
BUN: 14 mg/dL (ref 6–20)
CO2: 30 mmol/L (ref 22–32)
Calcium: 9.8 mg/dL (ref 8.9–10.3)
Chloride: 105 mmol/L (ref 98–111)
Creatinine: 0.71 mg/dL (ref 0.44–1.00)
GFR, Est AFR Am: >60 mL/min (ref >60)
GFR, Estimated: >60 mL/min (ref >60)
Glucose, Bld: 85 mg/dL (ref 70–99)
Potassium: 3.8 mmol/L (ref 3.5–5.1)
Sodium: 143 mmol/L (ref 135–145)
Total Bilirubin: 0.5 mg/dL (ref 0.3–1.2)
Total Protein: 7.3 g/dL (ref 6.5–8.1)

## 2019-12-23 LAB — CBC WITH DIFFERENTIAL (CANCER CENTER ONLY)
Abs Immature Granulocytes: 0.01 10*3/uL (ref 0.00–0.07)
Basophils Absolute: 0 10*3/uL (ref 0.0–0.1)
Basophils Relative: 1 %
Eosinophils Absolute: 0.2 10*3/uL (ref 0.0–0.5)
Eosinophils Relative: 3 %
HCT: 41.9 % (ref 36.0–46.0)
Hemoglobin: 13.7 g/dL (ref 12.0–15.0)
Immature Granulocytes: 0 %
Lymphocytes Relative: 47 %
Lymphs Abs: 2.4 10*3/uL (ref 0.7–4.0)
MCH: 30.2 pg (ref 26.0–34.0)
MCHC: 32.7 g/dL (ref 30.0–36.0)
MCV: 92.3 fL (ref 80.0–100.0)
Monocytes Absolute: 0.4 10*3/uL (ref 0.1–1.0)
Monocytes Relative: 7 %
Neutro Abs: 2.1 10*3/uL (ref 1.7–7.7)
Neutrophils Relative %: 42 %
Platelet Count: 258 10*3/uL (ref 150–400)
RBC: 4.54 MIL/uL (ref 3.87–5.11)
RDW: 12.7 % (ref 11.5–15.5)
WBC Count: 5.1 10*3/uL (ref 4.0–10.5)
nRBC: 0 % (ref 0.0–0.2)

## 2019-12-23 LAB — GENETIC SCREENING ORDER

## 2019-12-23 MED ORDER — ONDANSETRON HCL 8 MG PO TABS: 8.0000 mg | ORAL_TABLET | Freq: Two times a day (BID) | ORAL | 1 refills | Status: DC | PRN

## 2019-12-23 MED ORDER — PROCHLORPERAZINE MALEATE 10 MG PO TABS: 10.0000 mg | ORAL_TABLET | Freq: Four times a day (QID) | ORAL | 1 refills | Status: DC | PRN

## 2019-12-23 MED ORDER — LORAZEPAM 0.5 MG PO TABS: 0.5000 mg | ORAL_TABLET | Freq: Four times a day (QID) | ORAL | 0 refills | Status: DC | PRN

## 2019-12-23 MED ORDER — LIDOCAINE-PRILOCAINE 2.5-2.5 % EX CREA: TOPICAL_CREAM | CUTANEOUS | 3 refills | Status: DC

## 2019-12-23 MED ORDER — DEXAMETHASONE 4 MG PO TABS: 4.0000 mg | ORAL_TABLET | Freq: Every day | ORAL | 0 refills | Status: DC

## 2019-12-23 NOTE — Progress Notes (Signed)
REFERRING PROVIDER: Nicholas Lose, MD [place],  [place] [address]  PRIMARY PROVIDER:  Patient, No Pcp Per  PRIMARY REASON FOR VISIT:  1. Malignant neoplasm of upper-outer quadrant of right breast in female, estrogen receptor positive ([place])   2. Family history of breast cancer   3. Family history of melanoma      I connected with Michelle Anthony on 12/25/2019 at 12:15pm EDT by video conference and verified that I am speaking with the correct person using two identifiers.   Patient location: [facility] clinic Provider location: [facility] office  HISTORY OF PRESENT ILLNESS:   Michelle Anthony, a 54 y.o. female, was seen for a [place] cancer genetics consultation at the request of Dr. Lindi Adie due to a personal and family history of cancer.  Michelle Anthony presents to clinic today to discuss the possibility of a hereditary predisposition to cancer, genetic testing, and to further clarify her future cancer risks, as well as potential cancer risks for family members.   In 2021, at the age of 86, Michelle Anthony was diagnosed with invasive lobular carcinoma of the right breast. The treatment plan includes neoadjuvant chemotherapy, surgery, radiation therapy, and antiestrogen therapy.   CANCER HISTORY:  Oncology History  Malignant neoplasm of upper-outer quadrant of right breast in female, estrogen receptor positive ([place])  12/16/2019 Initial Diagnosis   Screening mammogram  showed a right breast asymmetry. Diagnostic mammogram showed a 2.9cm mass at the 9 o'clock position with 2 adjacent masses at the 10 and 8 o'clock positions, 0.9cm and 1.4cm respectively, and an enlarged lymph node, 1.3cm.  Biopsy revealed grade 2-3 invasive lobular cancer with LCIS ER 100%, PR 2%, Ki-67 20%, HER-2 positive, 1.1 cm mass at 10:00: Biopsy ALH, 1.4 cm at 8:00: Biopsy benign   01/04/2020 -  Chemotherapy   The patient had dexamethasone (DECADRON) 4 MG tablet, 4 mg (100 % of original dose 4 mg), Oral, Daily, 1 of 1 cycle,  Start date: 12/23/2019, End date: -- Dose modification: 4 mg (original dose 4 mg, Cycle 0) palonosetron (ALOXI) injection 0.25 mg, 0.25 mg, Intravenous,  Once, 0 of 6 cycles pegfilgrastim-jmdb (FULPHILA) injection 6 mg, 6 mg, Subcutaneous,  Once, 0 of 6 cycles CARBOplatin (PARAPLATIN) 540 mg in sodium chloride 0.9 % 250 mL chemo infusion, 540 mg (100 % of original dose 541 mg), Intravenous,  Once, 0 of 6 cycles Dose modification:   (original dose 541 mg, Cycle 1) DOCEtaxel (TAXOTERE) 130 mg in sodium chloride 0.9 % 250 mL chemo infusion, 75 mg/m2 = 130 mg, Intravenous,  Once, 0 of 6 cycles fosaprepitant (EMEND) 150 mg in sodium chloride 0.9 % 145 mL IVPB, 150 mg, Intravenous,  Once, 0 of 6 cycles pertuzumab (PERJETA) 420 mg in sodium chloride 0.9 % 250 mL chemo infusion, 420 mg (100 % of original dose 420 mg), Intravenous, Once, 0 of 6 cycles Dose modification: 420 mg (original dose 420 mg, Cycle 1, Reason: Provider Judgment) trastuzumab-dkst (OGIVRI) 525 mg in sodium chloride 0.9 % 250 mL chemo infusion, 8 mg/kg = 525 mg, Intravenous,  Once, 0 of 6 cycles  for chemotherapy treatment.       RISK FACTORS:  Menarche was at age 35.  First live birth at age 53.  OCP use for approximately 18 years.  Ovaries intact: yes.  Hysterectomy: no.  Menopausal status: postmenopausal.  HRT use: 0 years. Colonoscopy: yes. Mammogram within the last year: yes. Any excessive radiation exposure in the past: no  Past Medical History:  Diagnosis  Date  . Erythema nodosum   . Family history of breast cancer   . Family history of melanoma   . Frequent UTI   . Frozen shoulder   . STD (sexually transmitted disease)    H/O HSV II    Past Surgical History:  Procedure Laterality Date  . BOTOX INJECTION      Social History   Socioeconomic History  . Marital status: Divorced    Spouse name: Not on file  . Number of children: Not on file  . Years of education: Not on file  . Highest education level:  Not on file  Occupational History  . Not on file  Tobacco Use  . Smoking status: Former Smoker    Types: Cigarettes    Quit date: 08/26/1982    Years since quitting: 37.3  . Smokeless tobacco: Never Used  Substance and Sexual Activity  . Alcohol use: Yes    Alcohol/week: 5.0 standard drinks    Types: 5 Glasses of wine per week  . Drug use: No  . Sexual activity: Yes    Partners: Male    Birth control/protection: Other-see comments    Comment: vasectomy  Other Topics Concern  . Not on file  Social History Narrative  . Not on file   Social Determinants of Health   Financial Resource Strain: Low Risk   . Difficulty of Paying Living Expenses: Not hard at all  Food Insecurity: No Food Insecurity  . Worried About Charity fundraiser in the Last Year: Never true  . Ran Out of Food in the Last Year: Never true  Transportation Needs: No Transportation Needs  . Lack of Transportation (Medical): No  . Lack of Transportation (Non-Medical): No  Physical Activity:   . Days of Exercise per Week: Not on file  . Minutes of Exercise per Session: Not on file  Stress:   . Feeling of Stress : Not on file  Social Connections:   . Frequency of Communication with Friends and Family: Not on file  . Frequency of Social Gatherings with Friends and Family: Not on file  . Attends Religious Services: Not on file  . Active Member of Clubs or Organizations: Not on file  . Attends Archivist Meetings: Not on file  . Marital Status: Not on file     FAMILY HISTORY:  We obtained a detailed, 4-generation family history.  Significant diagnoses are listed below: Family History  Problem Relation Age of Onset  . Thyroid disease Mother   . Osteopenia Mother   . Colon polyps Mother        unknown number  . Thyroid disease Sister   . Cancer Father 85       appendiceal cancer--stage IV  . Diabetes Father 62       thin  . Thyroid disease Maternal Grandmother   . Breast cancer Maternal Aunt 70   . Breast cancer Paternal Aunt 30  . Diabetes Paternal Aunt   . Diabetes Paternal Uncle   . Liver cancer Paternal Grandmother 8       died of liver cancer at 34  . Breast cancer Cousin 49       M 1st cousin, died at 6  . Melanoma Maternal Uncle 50       blonde/blue eyes  . Melanoma Cousin        dx <50, maternal 1st cousin   Michelle Anthony has one son and one daughter (ages 77 and 3, respectively). She has  one brother (age 8) and two sisters who are twins (ages 31). None of these family members have had cancer.  Michelle Anthony's mother is 11 and has not had cancer. Michelle Anthony has three maternal aunts and three maternal uncles. One aunt had breast cancer (diagnosed age 64), and one uncle had melanoma (diagnosd age 49). One maternal first cousin died from breast cancer and lung cancer at the age of 12. Another maternal first cousin had melanoma (diagnosed younger than 65). Her maternal grandmother died at the age of 82 from a heart attack, and her maternal grandfather died at the age of 73 from a heart attack.   Michelle Anthony's father is 36 and has a history of appendiceal cancer (diagnosed age 46). She has three paternal aunts and one paternal uncle. Her uncle died at the age of 53 from suicide, although it is thought that he may have had cancer. One of her paternal aunts had breast cancer (diagnosed age 35). Her paternal grandmother died at the age of 30 from liver cancer (diagnosed age 37), and her paternal grandfather died at the age of 88 in his sleep.  Michelle Anthony is unaware of previous family history of genetic testing for hereditary cancer risks. Patient's maternal ancestors are of [place] descent, and paternal ancestors are of [place] and [place] descent. There is no reported Ashkenazi Jewish ancestry. There is no known consanguinity.  GENETIC COUNSELING ASSESSMENT: Michelle Anthony is a 54 y.o. female with a personal history of breast cancer and a family history of breast cancer, melanoma, and appendiceal  cancer, which is somewhat suggestive of a hereditary cancer syndrome and predisposition to cancer. We, therefore, discussed and recommended the following at today's visit.   DISCUSSION: We discussed that approximately 5-10% of breast cancer is hereditary, with most cases associated with the BRCA1 and BRCA2 genes. There are other genes that can be associated with hereditary bresat cancer syndromes. These include ATM, CHEK2, PALB2, etc. We discussed that testing is beneficial for several reasons, including knowing about other cancer risks, identifying potential screening and risk-reduction options that may be appropriate, and to understand if other family members could be at risk for cancer and allow them to undergo genetic testing.  We reviewed the characteristics, features and inheritance patterns of hereditary cancer syndromes. We also discussed genetic testing, including the appropriate family members to test, the process of testing, insurance coverage and turn-around-time for results. We discussed the implications of a negative, positive and/or variant of uncertain significant result. In order to get genetic test results in a timely manner so that Michelle Anthony can use these genetic test results for surgical decisions, we recommended Michelle Anthony pursue genetic testing for the [facility] Breast Cancer STAT Panel. Once complete, we recommend Michelle Anthony pursue reflex genetic testing to the Multi-Cancer panel.   The Breast Cancer STAT Panel offered by [facility] includes sequencing and deletion/duplication analysis for the following 9 genes:  ATM, BRCA1, BRCA2, CDH1, CHEK2, PALB2, PTEN, STK11 and TP53. The Multi-Cancer Panel offered by [facility] includes sequencing and/or deletion duplication testing of the following 85 genes: AIP, ALK, APC, ATM, AXIN2,BAP1,  BARD1, BLM, BMPR1A, BRCA1, BRCA2, BRIP1, CASR, CDC73, CDH1, CDK4, CDKN1B, CDKN1C, CDKN2A (p14ARF), CDKN2A (p16INK4a), CEBPA, CHEK2, CTNNA1, DICER1, DIS3L2, EGFR  (c.2369C>T, p.Thr790Met variant only), EPCAM (Deletion/duplication testing only), FH, FLCN, GATA2, GPC3, GREM1 (Promoter region deletion/duplication testing only), HOXB13 (c.251G>A, p.Gly84Glu), HRAS, KIT, MAX, MEN1, MET, MITF (c.952G>A, p.Glu318Lys variant only), MLH1, MSH2, MSH3, MSH6, MUTYH, NBN, NF1, NF2, NTHL1, PALB2, PDGFRA, PHOX2B,  PMS2, POLD1, POLE, POT1, PRKAR1A, PTCH1, PTEN, RAD50, RAD51C, RAD51D, RB1, RECQL4, RET, RNF43, RUNX1, SDHAF2, SDHA (sequence changes only), SDHB, SDHC, SDHD, SMAD4, SMARCA4, SMARCB1, SMARCE1, STK11, SUFU, TERC, TERT, TMEM127, TP53, TSC1, TSC2, VHL, WRN and WT1.  Based on Michelle Anthony's personal and family history of cancer, she meets medical criteria for genetic testing. Despite that she meets criteria, she may still have an out of pocket cost.   PLAN: After considering the risks, benefits, and limitations, Michelle Anthony provided informed consent to pursue genetic testing and the blood sample was sent to [facility] for analysis of the Breast Cancer STAT Panel + Multi-Cancer Panel. Results should be available within approximately one-two weeks' time, at which point they will be disclosed by telephone to Ms. [facility], as will any additional recommendations warranted by these results. Michelle Anthony will receive a summary of her genetic counseling visit and a copy of her results once available. This information will also be available in Epic.   Michelle Anthony questions were answered to her satisfaction today. Our contact information was provided should additional questions or concerns arise. Thank you for the referral and allowing [place] to share in the care of your patient.   Clint Guy, [place], [facility] [place] Licensed, Certified Dispensing [email or web address] Phone: [phone]  The patient was seen for a total of 25 minutes in face-to-face genetic counseling.  This patient was discussed with Drs. Magrinat, Lindi Adie and/or Burr Medico who agrees with the above.     _______________________________________________________________________ For Office Staff:  Number of people involved in session: 1 Was an Intern/ student involved with case: no

## 2019-12-23 NOTE — H&P (Signed)
Michelle Anthony Appointment: 12/23/2019 9:00 AM Location: [facility] Patient #: [id number] DOB: 1965-07-15 Undefined / Language: [name] / Race: White Female  History of Present Illness Michelle Moores A. Nayellie Sanseverino MD; 12/23/2019 10:57 AM) Patient words: Pt sen in [place] for abnormal mammogram. 2 .9 cm Upper outer quadrant right breast core bx ILC grade 2 ER POS PR 2 % HER 2 NEU POSITIVE ALSO SATELLITE lesion 0.9 cm. also area 10 oclock 1.1 cm ALH and at 8 ocolck 1.4 cm benign  Strong family history noted no hx of mass pain or discharge.  The patient is a 54 year old female.   Past Surgical History Michelle Slipper, RN; 12/23/2019 8:18 AM) Breast Biopsy Right.  Diagnostic Studies History Michelle Slipper, RN; 12/23/2019 8:18 AM) Colonoscopy 1-5 years ago Mammogram within last year Pap Smear 1-5 years ago  Medication History Michelle Slipper, RN; 12/23/2019 8:17 AM) Medications Reconciled  Social History Michelle Slipper, RN; 12/23/2019 8:18 AM) Alcohol use Moderate alcohol use. Caffeine use Carbonated beverages, Coffee, Tea. Illicit drug use Remotely quit drug use. Tobacco use Former smoker.  Family History Michelle Slipper, RN; 12/23/2019 8:18 AM) Arthritis Mother. Breast Cancer Family Members In General. Cancer Family Members In General, Father. Cerebrovascular Accident Family Members In General. Colon Polyps Father, Mother. Diabetes Mellitus Family Members In General, Father. Heart Disease Mother. Melanoma Family Members In General. Migraine Headache Daughter, Mother. Respiratory Condition Family Members In General. Thyroid problems Mother, Sister.  Pregnancy / Birth History Michelle Slipper, RN; 12/23/2019 8:18 AM) Age at menarche 67 years. Age of menopause 51-55 Contraceptive History Oral contraceptives. Gravida 2 Irregular periods Length (months) of breastfeeding 3-6 Maternal age 106-25 Para 2  Other Problems Michelle Slipper, RN; 12/23/2019 8:18 AM) Anxiety  Disorder Bladder Problems Depression Hypercholesterolemia Lump In Breast     Review of Systems Michelle Slipper RN; 12/23/2019 8:18 AM) General Present- Fatigue, Night Sweats and Weight Loss. Not Present- Appetite Loss, Chills, Fever and Weight Gain. Skin Not Present- Change in Wart/Mole, Dryness, Hives, Jaundice, New Lesions, Non-Healing Wounds, Rash and Ulcer. HEENT Present- Wears glasses/contact lenses. Not Present- Earache, Hearing Loss, Hoarseness, Nose Bleed, Oral Ulcers, Ringing in the Ears, Seasonal Allergies, Sinus Pain, Sore Throat, Visual Disturbances and Yellow Eyes. Respiratory Not Present- Bloody sputum, Chronic Cough, Difficulty Breathing, Snoring and Wheezing. Breast Present- Breast Mass and Breast Pain. Not Present- Nipple Discharge and Skin Changes. Gastrointestinal Not Present- Abdominal Pain, Bloating, Bloody Stool, Change in Bowel Habits, Chronic diarrhea, Constipation, Difficulty Swallowing, Excessive gas, Gets full quickly at meals, Hemorrhoids, Indigestion, Nausea, Rectal Pain and Vomiting. Female Genitourinary Not Present- Frequency, Nocturia, Painful Urination, Pelvic Pain and Urgency. Musculoskeletal Present- Muscle Pain. Not Present- Back Pain, Joint Pain, Joint Stiffness, Muscle Weakness and Swelling of Extremities. Neurological Not Present- Decreased Memory, Fainting, Headaches, Numbness, Seizures, Tingling, Tremor, Trouble walking and Weakness. Psychiatric Present- Anxiety. Not Present- Bipolar, Change in Sleep Pattern, Depression, Fearful and Frequent crying. Endocrine Present- Cold Intolerance and Hot flashes. Not Present- Excessive Hunger, Hair Changes, Heat Intolerance and New Diabetes. Hematology Not Present- Blood Thinners, Easy Bruising, Excessive bleeding, Gland problems, HIV and Persistent Infections.   Physical Exam (Michelle Anthony A. Michelle Seehafer MD; 12/23/2019 10:57 AM)  General Mental Status-Alert. General Appearance-Consistent with stated  age. Hydration-Well hydrated. Voice-Normal.  Head and Neck Head-normocephalic, atraumatic with no lesions or palpable masses. Trachea-midline. Thyroid Gland Characteristics - normal size and consistency.  Eye Eyeball - Bilateral-Extraocular movements intact. Sclera/Conjunctiva - Bilateral-No scleral icterus.  Chest and Lung Exam Chest and lung exam reveals -quiet, even  and easy respiratory effort with no use of accessory muscles and on auscultation, normal breath sounds, no adventitious sounds and normal vocal resonance. Inspection Chest Wall - Normal. Back - normal.  Breast Breast - Left-Symmetric, Non Tender, No Biopsy scars, no Dimpling - Left, No Inflammation, No Lumpectomy scars, No Mastectomy scars, No Peau d' Orange. Breast - Right-Symmetric, Non Tender, No Biopsy scars, no Dimpling - Right, No Inflammation, No Lumpectomy scars, No Mastectomy scars, No Peau d' Orange. Breast Lump-No Palpable Breast Mass.  Cardiovascular Cardiovascular examination reveals -normal heart sounds, regular rate and rhythm with no murmurs and normal pedal pulses bilaterally.  Abdomen Inspection Inspection of the abdomen reveals - No Hernias. Skin - Scar - no surgical scars. Palpation/Percussion Palpation and Percussion of the abdomen reveal - Soft, Non Tender, No Rebound tenderness, No Rigidity (guarding) and No hepatosplenomegaly. Auscultation Auscultation of the abdomen reveals - Bowel sounds normal.  Neurologic Neurologic evaluation reveals -alert and oriented x 3 with no impairment of recent or remote memory. Mental Status-Normal.  Musculoskeletal Normal Exam - Left-Upper Extremity Strength Normal and Lower Extremity Strength Normal. Normal Exam - Right-Upper Extremity Strength Normal and Lower Extremity Strength Normal.  Lymphatic Head & Neck  General Head & Neck Lymphatics: Bilateral - Description - Normal. Axillary  General Axillary Region:  Bilateral - Description - Normal. Tenderness - Non Tender. Femoral & Inguinal  Generalized Femoral & Inguinal Lymphatics: Bilateral - Description - Normal. Tenderness - Non Tender.    Assessment & Plan (Michelle Abalos A. Sion Thane MD; 12/23/2019 10:58 AM)  BREAST CANCER, STAGE 2, RIGHT (C50.911) Impression: neoadjuvant chemotherapy breast conserving surgery possible genetics port placement MRI  TOTAL TIME 45 MINUTES  Pt requires port placement for chemotherapy. Risk include bleeding, infection, pneumothorax, hemothorax, mediastinal injury, nerve injury , blood vessel injury, strOke, blood clots, death, migration. embolization and need for additional procedures. Pt agrees to proceed.  Current Plans You are being scheduled for surgery- Our schedulers will call you.  You should hear from our office's scheduling department within 5 working days about the location, date, and time of surgery. We try to make accommodations for patient's preferences in scheduling surgery, but sometimes the OR schedule or the surgeon's schedule prevents [place] from making those accommodations.  If you have not heard from our office [phone] in 5 working days, call the office and ask for your surgeon's nurse.  If you have other questions about your diagnosis, plan, or surgery, call the office and ask for your surgeon's nurse.  Pt Education - [facility] Breast Cancer Information Given - [facility] "Breast Journey" Package

## 2019-12-23 NOTE — Progress Notes (Signed)
START ON PATHWAY REGIMEN - Breast     A cycle is every 21 days:     Pertuzumab      Pertuzumab      Trastuzumab-xxxx      Trastuzumab-xxxx      Carboplatin      Docetaxel   **Always confirm dose/schedule in your pharmacy ordering system**  Patient Characteristics: Preoperative or Nonsurgical Candidate (Clinical Staging), Neoadjuvant Therapy followed by Surgery, Invasive Disease, Chemotherapy, HER2 Positive, ER Positive Therapeutic Status: Preoperative or Nonsurgical Candidate (Clinical Staging) AJCC M Category: cM0 AJCC Grade: G2 Breast Surgical Plan: Neoadjuvant Therapy followed by Surgery ER Status: Positive (+) AJCC 8 Stage Grouping: IB HER2 Status: Positive (+) AJCC T Category: cT2 AJCC N Category: cN0 PR Status: Positive (+) Intent of Therapy: Curative Intent, Discussed with Patient

## 2019-12-23 NOTE — Assessment & Plan Note (Signed)
12/09/2019:Screening mammogram  showed a right breast asymmetry. Diagnostic mammogram showed a 2.9cm mass at the 9 o'clock position with 2 adjacent masses at the 10 and 8 o'clock positions, 0.9cm and 1.4cm respectively, and an enlarged lymph node, 1.3cm.  Biopsy revealed grade 2-3 invasive lobular cancer with LCIS ER 100%, PR 2%, Ki-67 20%, HER-2 positive, 1.1 cm mass at 10:00: Biopsy ALH, 1.4 cm at 8:00: Biopsy benign  Pathology and radiology counseling: Discussed with the patient, the details of pathology including the type of breast cancer,the clinical staging, the significance of ER, PR and HER-2/neu receptors and the implications for treatment. After reviewing the pathology in detail, we proceeded to discuss the different treatment options between surgery, radiation, chemotherapy, antiestrogen therapies.  Recommendation based on multidisciplinary tumor board: 1. Neoadjuvant chemotherapy with TCH Perjeta 6 cycles followed by Herceptin and Perjeta versus Kadcyla maintenance for 1 year (also could be randomized compass HER 2 trial) 2. Followed by breast conserving surgery if possible with sentinel lymph node study 3. Followed by adjuvant radiation therapy if patient had lumpectomy 4.  Followed by adjuvant antiestrogen therapy  Chemotherapy Counseling: I discussed the risks and benefits of chemotherapy including the risks of nausea/ vomiting, risk of infection from low WBC count, fatigue due to chemo or anemia, bruising or bleeding due to low platelets, mouth sores, loss/ change in taste and decreased appetite. Liver and kidney function will be monitored through out chemotherapy as abnormalities in liver and kidney function may be a side effect of treatment. Cardiac dysfunction due to Herceptin and Perjeta were discussed in detail. Risk of permanent bone marrow dysfunction due to chemo were also discussed.  Plan: 1. Port placement 2. Echocardiogram 3. Chemotherapy class 4. Breast MRI 5.  Genetics  consult  Return to clinic in 2 weeks to start chemotherapy.  

## 2019-12-23 NOTE — Research (Signed)
[id number], A PROSPECTIVE OBSERVATIONAL COHORT STUDY TO DEVELOP A PREDICTIVE MODEL OFTAXANE-INDUCED PERIPHERAL NEUROPATHY IN CANCER PATIENTS. 12/23/2019 at 1:16pm - Dr. Lindi Adie referred this pt for the S1714 study (neuropathy study).  The research nurse met with the pt and her daughter this morning for 20 minutes going over the S1714 study.  The pt was told that her participation is voluntary, and she was told that she can stop at anytime if she elects to enroll into the study.  The pt was informed about the purpose of the study.  The pt was also informed about the required study evaluations at baseline, week 4, 8, 12, 24, 52, 104 (year 2), and week 156 (year 3).  The nurse explained to the pt and her daughter about the questionnaires and research blood samples.  The nurse also explained about the optional studies involved with the S1714.  The research nurse gave the pt a copy of the consent form along with the hipaa form to read.  The pt was also given the nurse's business card.  The pt was encouraged to call the nurse if she has any questions/concerns about the study.  The risks and benefits of participation were explained to the pt.  The pt said that she was interested in participation in the study.  The nurse encouraged the pt to read over the consent.  The plan is for the research nurse to call the pt next week.  The pt also agreed to possibly sign the consent when she comes to the [facility] for her chemo education class. The pt is scheduled for this class on 12/30/19 at 10am.  The pt was thanked for her interest in this study.  The research nurse will review the pt's chart for eligibility purposes.  Brion Aliment RN, BSN, [facility] Clinical Research Nurse 12/23/2019 1:33 PM   12/30/2019 at 10:34am -The research nurse called and spoke to the pt.  The pt said that she has not read the consent form yet.  The pt is still eager to participate in the study.  The patient agreed to read the consent form today and to meet with the  research nurse tomorrow after her patient education class.  The pt was thanked for her interest in the study.  The patient education nurse was informed to contact the research nurse when the pt is available to meet with the research team. Brion Aliment RN, BSN Clinical Research Nurse 12/29/2019 10:46 AM

## 2019-12-23 NOTE — Therapy (Signed)
[facility] [place], [place], [address] Phone: [phone]   Fax:  [phone]  Physical Therapy Evaluation  Patient Details  Name: Michelle Anthony MRN: [id number] Date of Birth: [birth year] Referring Provider (PT): Dr. Erroll Luna   Encounter Date: 12/23/2019   PT End of Session - 12/23/19 1154    Visit Number 1    Number of Visits 2    Date for PT Re-Evaluation 06/21/20    PT Start Time 1030    PT Stop Time 1100   Also saw pt from 1121-1128 for a total of 37 minutes   PT Time Calculation (min) 30 min    Activity Tolerance Patient tolerated treatment well    Behavior During Therapy [facility] for tasks assessed/performed           Past Medical History:  Diagnosis Date  . Erythema nodosum   . Frequent UTI   . Frozen shoulder   . STD (sexually transmitted disease)    H/O HSV II    Past Surgical History:  Procedure Laterality Date  . BOTOX INJECTION      There were no vitals filed for this visit.    Subjective Assessment - 12/23/19 1146    Subjective Patient reports she is here today to be seen by her medical team for her newly diagnosed right breast cancer.    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 It measures 2.9 cm and is located in the upper outer quadrant. It is ER/PR positive and HER2 negative with a Ki67 of 20%.    Patient Stated Goals Reduce lymphedema risk and learn post op shoulder ROM HEP    Currently in Pain? No/denies   Has bil plantar faciitis which is painful when walking             [name] PT Assessment - 12/23/19 0001      Assessment   Medical Diagnosis Right breast cancer    Referring Provider (PT) Dr. [name] [place]    Onset Date/Surgical Date 12/02/19    Hand Dominance Right    Prior Therapy none      Precautions   Precautions Other  (comment)    Precaution Comments active cancer      Restrictions   Weight Bearing Restrictions No      Balance Screen   Has the patient fallen in the past 6 months No    Has the patient had a decrease in activity level because of a fear of falling?  No    Is the patient reluctant to leave their home because of a fear of falling?  No      Home Environment   Living Environment Private residence    Living Arrangements Children   56 y.o. son   Available Help at Discharge Family      Prior Function   Level of Independence Independent    Vocation Full time employment    Geophysicist/field seismologist at [employer] She walks 30 min 6x/week      Cognition   Overall Cognitive Status Within Functional Limits for tasks assessed      Posture/Postural Control   Posture/Postural Control No significant limitations    Postural Limitations --      ROM / Strength   AROM / PROM / Strength AROM;Strength      AROM  Overall AROM Comments Cervical AROM is WNL    AROM Assessment Site Shoulder    Right/Left Shoulder Right;Left    Right Shoulder Extension 50 Degrees    Right Shoulder Flexion 170 Degrees    Right Shoulder ABduction 166 Degrees    Right Shoulder Internal Rotation 63 Degrees    Right Shoulder External Rotation 90 Degrees    Left Shoulder Extension 50 Degrees    Left Shoulder Flexion 155 Degrees    Left Shoulder ABduction 167 Degrees    Left Shoulder Internal Rotation 67 Degrees    Left Shoulder External Rotation 80 Degrees      Strength   Overall Strength Within functional limits for tasks performed             LYMPHEDEMA/ONCOLOGY QUESTIONNAIRE - 12/23/19 0001      Type   Cancer Type Right breast cancer      Lymphedema Assessments   Lymphedema Assessments Upper extremities      Right Upper Extremity Lymphedema   10 cm Proximal to Olecranon Process 27.9 cm    Olecranon Process 24.4 cm    10 cm Proximal to Ulnar Styloid Process 21.9 cm    Just  Proximal to Ulnar Styloid Process 15.9 cm    Across Hand at [employer] 19 cm    At [place] of 2nd Digit 6.2 cm      Left Upper Extremity Lymphedema   10 cm Proximal to Olecranon Process 27.5 cm    Olecranon Process 24.5 cm    10 cm Proximal to Ulnar Styloid Process 20.9 cm    Just Proximal to Ulnar Styloid Process 15.3 cm    Across Hand at [employer] 18.5 cm    At [place] of 2nd Digit 6.2 cm           L-DEX FLOWSHEETS - 12/23/19 1100      L-DEX LYMPHEDEMA SCREENING   Measurement Type Unilateral    L-DEX MEASUREMENT EXTREMITY Upper Extremity    POSITION  Standing    DOMINANT SIDE Right    At Risk Side Right    BASELINE SCORE (UNILATERAL) -1.7          The patient was assessed using the L-Dex machine today to produce a lymphedema index baseline score. The patient will be reassessed on a regular basis (typically every 3 months) to obtain new L-Dex scores. If the score is > 6.5 points away from his/her baseline score indicating onset of subclinical lymphedema, it will be recommended to wear a compression garment for 4 weeks, 12 hours per day and then be reassessed. If the score continues to be > 6.5 points from baseline at reassessment, we will initiate lymphedema treatment. Assessing in this manner has a 95% rate of preventing clinically significant lymphedema.       Katina Dung - 12/23/19 0001    Open a tight or new jar Mild difficulty    Do heavy household chores (wash walls, wash floors) Mild difficulty    Carry a shopping bag or briefcase No difficulty    Wash your back No difficulty    Use a knife to cut food No difficulty    Recreational activities in which you take some force or impact through your arm, shoulder, or hand (golf, hammering, tennis) No difficulty    During the past week, to what extent has your arm, shoulder or hand problem interfered with your normal social activities with family, friends, neighbors, or groups? Not at all  During the past week, to  what extent has your arm, shoulder or hand problem limited your work or other regular daily activities Not at all    Arm, shoulder, or hand pain. None    Tingling (pins and needles) in your arm, shoulder, or hand None    Difficulty Sleeping No difficulty    DASH Score 4.55 %            Objective measurements completed on examination: See above findings.       Patient was instructed today in a home exercise program today for post op shoulder range of motion. These included active assist shoulder flexion in sitting, scapular retraction, wall walking with shoulder abduction, and hands behind head external rotation.  She was encouraged to do these twice a day, holding 3 seconds and repeating 5 times when permitted by her physician.            PT Education - 12/23/19 1154    Education Details Lymphedema risk reduction and post op shoulder ROM HEP    Person(s) Educated Patient    Methods Explanation;Demonstration;Handout    Comprehension Returned demonstration;Verbalized understanding               PT Long Term Goals - 12/23/19 1158      PT LONG TERM GOAL #1   Title Patient will demonstrate she has regained full shoulder ROM and function post operatively compared to baselines.    Time 6    Period Months    Status New    Target Date 06/21/20           Breast Clinic Goals - 12/23/19 1158      Patient will be able to verbalize understanding of pertinent lymphedema risk reduction practices relevant to her diagnosis specifically related to skin care.   Time 1    Period Days    Status Achieved      Patient will be able to return demonstrate and/or verbalize understanding of the post-op home exercise program related to regaining shoulder range of motion.   Time 1    Period Days    Status Achieved      Patient will be able to verbalize understanding of the importance of attending the postoperative After Breast Cancer Class for further lymphedema risk reduction  education and therapeutic exercise.   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 It measures 2.9 cm and is located in the upper outer quadrant. It is ER/PR positive and HER2 negative with a Ki67 of 20%. Her multidisciplinary medical team met prior to her assessments to determine a recommended treatment plan. She is planning to have neoadjuvant chemotherapy followed by a right lumpectomy and sentinel node biopsy, radiation, and anti-estrogen therapy. She will benefit from a post op PT reassessment to determine needs and from L-Dex screenings every 3 months to detect subclinical lympehdema.    Stability/Clinical Decision Making Stable/Uncomplicated    Clinical Decision Making Low    Rehab Potential Excellent    PT Frequency --   Eval and 1 f/u visit   PT Treatment/Interventions ADLs/Self Care Home Management;Therapeutic exercise;Patient/family education    PT Next Visit Plan Will reassess 3-4 weeks post op to determine needs    PT  Home Exercise Plan Post op shoulder ROM HEP    Consulted and Agree with Plan of Care Patient;Family member/caregiver    Family Member Consulted Daughter           Patient will benefit from skilled therapeutic intervention in order to improve the following deficits and impairments:  Postural dysfunction, Decreased range of motion, Impaired UE functional use, Pain, Decreased knowledge of precautions  Visit Diagnosis: Malignant neoplasm of upper-outer quadrant of right breast in female, estrogen receptor positive ([place]) - Plan: PT plan of care cert/re-cert   Patient will follow up at outpatient cancer rehab 3-4 weeks following surgery.  If the patient requires physical therapy at that time, a specific plan will be dictated and sent to the referring physician for approval. The  patient was educated today on appropriate basic range of motion exercises to begin post operatively and the importance of attending the After Breast Cancer class following surgery.  Patient was educated today on lymphedema risk reduction practices as it pertains to recommendations that will benefit the patient immediately following surgery.  She verbalized good understanding.     Problem List Patient Active Problem List   Diagnosis Date Noted  . Malignant neoplasm of upper-outer quadrant of right breast in female, estrogen receptor positive ([place]) 12/16/2019  . Arthritis of carpometacarpal (CMC) joint of thumb 11/01/2017  . Fatigue 11/01/2017   Annia Friendly, PT 12/23/19 12:01 PM  [place], [place], [address] Phone: [phone]   Fax:  [phone]  Name: Michelle Anthony MRN: [id number] Date of Birth: [birth year]

## 2019-12-23 NOTE — Patient Instructions (Signed)

## 2019-12-23 NOTE — H&P (View-Only) (Signed)
Michelle Anthony Appointment: 12/23/2019 9:00 AM Location: [facility] Patient #: [id number] DOB: 1965-08-28 Undefined / Language: Michelle Anthony / Race: White Female  History of Present Illness Michelle Moores A. Michelle Pareja MD; 12/23/2019 10:57 AM) Patient words: Pt sen in [place] for abnormal mammogram. 2 .9 cm Upper outer quadrant right breast core bx ILC grade 2 ER POS PR 2 % HER 2 NEU POSITIVE ALSO SATELLITE lesion 0.9 cm. also area 10 oclock 1.1 cm ALH and at 8 ocolck 1.4 cm benign  Strong family history noted no hx of mass pain or discharge.  The patient is a 54 year old female.   Past Surgical History Michelle Slipper, Anthony; 12/23/2019 8:18 AM) Breast Biopsy Right.  Diagnostic Studies History Michelle Slipper, Anthony; 12/23/2019 8:18 AM) Colonoscopy 1-5 years ago Mammogram within last year Pap Smear 1-5 years ago  Medication History Michelle Slipper, Anthony; 12/23/2019 8:17 AM) Medications Reconciled  Social History Michelle Slipper, Anthony; 12/23/2019 8:18 AM) Alcohol use Moderate alcohol use. Caffeine use Carbonated beverages, Coffee, Tea. Illicit drug use Remotely quit drug use. Tobacco use Former smoker.  Family History Michelle Slipper, Anthony; 12/23/2019 8:18 AM) Arthritis Mother. Breast Cancer Family Members In General. Cancer Family Members In General, Father. Cerebrovascular Accident Family Members In General. Colon Polyps Father, Mother. Diabetes Mellitus Family Members In General, Father. Heart Disease Mother. Melanoma Family Members In General. Migraine Headache Daughter, Mother. Respiratory Condition Family Members In General. Thyroid problems Mother, Sister.  Pregnancy / Birth History Michelle Slipper, Anthony; 12/23/2019 8:18 AM) Age at menarche 48 years. Age of menopause 51-55 Contraceptive History Oral contraceptives. Gravida 2 Irregular periods Length (months) of breastfeeding 3-6 Maternal age 73-25 Para 2  Other Problems Michelle Slipper, Anthony; 12/23/2019 8:18 AM) Anxiety  Disorder Bladder Problems Depression Hypercholesterolemia Lump In Breast     Review of Systems Michelle Anthony; 12/23/2019 8:18 AM) General Present- Fatigue, Night Sweats and Weight Loss. Not Present- Appetite Loss, Chills, Fever and Weight Gain. Skin Not Present- Change in Wart/Mole, Dryness, Hives, Jaundice, New Lesions, Non-Healing Wounds, Rash and Ulcer. HEENT Present- Wears glasses/contact lenses. Not Present- Earache, Hearing Loss, Hoarseness, Nose Bleed, Oral Ulcers, Ringing in the Ears, Seasonal Allergies, Sinus Pain, Sore Throat, Visual Disturbances and Yellow Eyes. Respiratory Not Present- Bloody sputum, Chronic Cough, Difficulty Breathing, Snoring and Wheezing. Breast Present- Breast Mass and Breast Pain. Not Present- Nipple Discharge and Skin Changes. Gastrointestinal Not Present- Abdominal Pain, Bloating, Bloody Stool, Change in Bowel Habits, Chronic diarrhea, Constipation, Difficulty Swallowing, Excessive gas, Gets full quickly at meals, Hemorrhoids, Indigestion, Nausea, Rectal Pain and Vomiting. Female Genitourinary Not Present- Frequency, Nocturia, Painful Urination, Pelvic Pain and Urgency. Musculoskeletal Present- Muscle Pain. Not Present- Back Pain, Joint Pain, Joint Stiffness, Muscle Weakness and Swelling of Extremities. Neurological Not Present- Decreased Memory, Fainting, Headaches, Numbness, Seizures, Tingling, Tremor, Trouble walking and Weakness. Psychiatric Present- Anxiety. Not Present- Bipolar, Change in Sleep Pattern, Depression, Fearful and Frequent crying. Endocrine Present- Cold Intolerance and Hot flashes. Not Present- Excessive Hunger, Hair Changes, Heat Intolerance and New Diabetes. Hematology Not Present- Blood Thinners, Easy Bruising, Excessive bleeding, Gland problems, HIV and Persistent Infections.   Physical Exam (Michelle Reth A. Michelle Gritz MD; 12/23/2019 10:57 AM)  General Mental Status-Alert. General Appearance-Consistent with stated  age. Hydration-Well hydrated. Voice-Normal.  Head and Neck Head-normocephalic, atraumatic with no lesions or palpable masses. Trachea-midline. Thyroid Gland Characteristics - normal size and consistency.  Eye Eyeball - Bilateral-Extraocular movements intact. Sclera/Conjunctiva - Bilateral-No scleral icterus.  Chest and Lung Exam Chest and lung exam reveals -quiet, even  and easy respiratory effort with no use of accessory muscles and on auscultation, normal breath sounds, no adventitious sounds and normal vocal resonance. Inspection Chest Wall - Normal. Back - normal.  Breast Breast - Left-Symmetric, Non Tender, No Biopsy scars, no Dimpling - Left, No Inflammation, No Lumpectomy scars, No Mastectomy scars, No Peau d' Orange. Breast - Right-Symmetric, Non Tender, No Biopsy scars, no Dimpling - Right, No Inflammation, No Lumpectomy scars, No Mastectomy scars, No Peau d' Orange. Breast Lump-No Palpable Breast Mass.  Cardiovascular Cardiovascular examination reveals -normal heart sounds, regular rate and rhythm with no murmurs and normal pedal pulses bilaterally.  Abdomen Inspection Inspection of the abdomen reveals - No Hernias. Skin - Scar - no surgical scars. Palpation/Percussion Palpation and Percussion of the abdomen reveal - Soft, Non Tender, No Rebound tenderness, No Rigidity (guarding) and No hepatosplenomegaly. Auscultation Auscultation of the abdomen reveals - Bowel sounds normal.  Neurologic Neurologic evaluation reveals -alert and oriented x 3 with no impairment of recent or remote memory. Mental Status-Normal.  Musculoskeletal Normal Exam - Left-Upper Extremity Strength Normal and Lower Extremity Strength Normal. Normal Exam - Right-Upper Extremity Strength Normal and Lower Extremity Strength Normal.  Lymphatic Head & Neck  General Head & Neck Lymphatics: Bilateral - Description - Normal. Axillary  General Axillary Region:  Bilateral - Description - Normal. Tenderness - Non Tender. Femoral & Inguinal  Generalized Femoral & Inguinal Lymphatics: Bilateral - Description - Normal. Tenderness - Non Tender.    Assessment & Plan (Michelle Veale A. Michelle Iglesia MD; 12/23/2019 10:58 AM)  BREAST CANCER, STAGE 2, RIGHT (C50.911) Impression: neoadjuvant chemotherapy breast conserving surgery possible genetics port placement MRI  TOTAL TIME 45 MINUTES  Pt requires port placement for chemotherapy. Risk include bleeding, infection, pneumothorax, hemothorax, mediastinal injury, nerve injury , blood vessel injury, strOke, blood clots, death, migration. embolization and need for additional procedures. Pt agrees to proceed.  Current Plans You are being scheduled for surgery- Our schedulers will call you.  You should hear from our office's scheduling department within 5 working days about the location, date, and time of surgery. We try to make accommodations for patient's preferences in scheduling surgery, but sometimes the OR schedule or the surgeon's schedule prevents [place] from making those accommodations.  If you have not heard from our office [phone]) in 5 working days, call the office and ask for your surgeon's nurse.  If you have other questions about your diagnosis, plan, or surgery, call the office and ask for your surgeon's nurse.  Pt Education - [facility] Breast Cancer Information Given - [facility] "Breast Journey" Package

## 2019-12-24 ENCOUNTER — Telehealth: Payer: Self-pay | Admitting: Hematology and Oncology

## 2019-12-24 ENCOUNTER — Encounter: Payer: Self-pay | Admitting: *Deleted

## 2019-12-24 NOTE — Telephone Encounter (Signed)
No 9/8 los, no changes made to pt schedule

## 2019-12-24 NOTE — Progress Notes (Signed)
Michelle Anthony Clinical Social Work INITIAL SDOH Screening Note   Michelle Anthony is a 54 y.o. year old female  Whom attended breast multidisciplinary clinic. SDOH screening requested referral to support services team to consult the patient for assistance with Housing.   Michelle Anthony was given information about support services today including CSW contact information, information about support team members and programs.   SDOH (Social Determinants of Health) assessments performed: Yes     Family/Social Information:  . Housing Arrangement: patient lives with son . Family members/support persons in your life? Family and friends  . Transportation: patient drives hersefl . Financial concerns: No  o Are you able to meet your monthly expenses or do you feel financially stressed? Yes  o Are you concerned about future financial problems due to your illness or keeping your job and income through treatment? No . Employment: Working full time. Income source: Employment . Food Security: No  . Concerns about diagnosis and/or treatment: she mentioned losing her hair, but no other concerns . Patient reported stressors: None . Patient enjoys spending time with family Current coping skills/ strengths: Ability for insight Average or above average intelligence Capable of independent living Communication skills Motivation for treatment/growth Supportive family/friends Work skills    SUMMARY: Current SDOH Barriers:  . None - Housing screening was completed in error, no needs at this time.  Clinical Social Work Clinical Goal(s):  [name] No needs at this time  Interventions: . Patient interviewed and SDOH assessment performed . Patient interviewed and appropriate assessments performed   Follow Up Plan: Patient will reach out to CSW as needed Patient verbalizes understanding of plan: Yes   Michelle Anthony

## 2019-12-25 ENCOUNTER — Encounter: Payer: Self-pay | Admitting: Genetic Counselor

## 2019-12-25 DIAGNOSIS — Z808 Family history of malignant neoplasm of other organs or systems: Secondary | ICD-10-CM | POA: Insufficient documentation

## 2019-12-25 DIAGNOSIS — Z803 Family history of malignant neoplasm of breast: Secondary | ICD-10-CM | POA: Insufficient documentation

## 2019-12-30 ENCOUNTER — Encounter: Payer: Self-pay | Admitting: Radiology

## 2019-12-30 ENCOUNTER — Inpatient Hospital Stay: Payer: BC Managed Care – PPO

## 2019-12-30 ENCOUNTER — Ambulatory Visit (HOSPITAL_COMMUNITY)
Admission: RE | Admit: 2019-12-30 | Discharge: 2019-12-30 | Disposition: A | Discharge status: Home / Self Care | Payer: BC Managed Care – PPO | Source: Ambulatory Visit | Attending: Hematology and Oncology | Admitting: Hematology and Oncology

## 2019-12-30 ENCOUNTER — Ambulatory Visit (HOSPITAL_BASED_OUTPATIENT_CLINIC_OR_DEPARTMENT_OTHER)
Admission: RE | Admit: 2019-12-30 | Discharge: 2019-12-30 | Disposition: A | Discharge status: Home / Self Care | Payer: BC Managed Care – PPO | Source: Ambulatory Visit | Attending: Hematology and Oncology | Admitting: Hematology and Oncology

## 2019-12-30 ENCOUNTER — Other Ambulatory Visit: Payer: Self-pay

## 2019-12-30 DIAGNOSIS — Z170 Estrogen receptor positive status [ER+]: Secondary | ICD-10-CM

## 2019-12-30 DIAGNOSIS — C50411 Malignant neoplasm of upper-outer quadrant of right female breast: Secondary | ICD-10-CM

## 2019-12-30 LAB — ECHOCARDIOGRAM COMPLETE
Area-P 1/2: 3.27 cm2
S' Lateral: 3.3 cm

## 2019-12-30 MED ORDER — GADOBUTROL 1 MMOL/ML IV SOLN
6.0000 mL | Freq: Once | INTRAVENOUS | Status: AC | PRN
  Administered 2019-12-30: 6 mL via INTRAVENOUS

## 2019-12-30 NOTE — Progress Notes (Signed)
.  The following biosimilar Udenyca ([id number]) has been selected for use in this patient due to insurance.  Henreitta Leber, PharmD

## 2019-12-30 NOTE — Research (Signed)
[id number], A PROSPECTIVE OBSERVATIONAL COHORT STUDY TO DEVELOP A PREDICTIVE MODEL OFTAXANE-INDUCED PERIPHERAL NEUROPATHY IN CANCER PATIENTS.  12/30/19 11:00AM  CONSENT: Met withChristy who was accompanied by her daughter in a private exam room for 40 minutes to discuss [facility] consent. We reviewedthe H8299 consent (protocol version date 09/25/2019 [place] Active Date 10/20/2019)andHIPPAform(dated 06/14/2017)with patient in their entirety. Explained the purpose of the study along with potential risks and benefits of participation. Reviewed the study required assessments and timeline for completing these assessments. Informed patient that participation is completely voluntary and she may withdraw consent at any time.Upon completion of review, patient was offered to ask any questions or express any concerns. She did ask about what kind of personal data would be collected. I informed her (via HIPPA form) what type of information is collected and the reason why this information is collected. Alyse Low also asked at what point the assessments would be done and if it would cause her to be here longer. I explained the process of assessments and the timing of when they are completed.  Her questions were answered in their entirety and Alyse Low had no further questions. [name] signed and dated the consentand HIPPA form voluntarily. Patient did agree to the optional use of her blood for future research. I also obtained initials and a signature on the ROI. She did agree to being contacted by the study for future research. Copies of signed/dated consent andHIPPAformweregiven to patient for her records. Patient was given PROs to take home and complete due to having multiple appointments today. Eligibility was confirmed by clinical research nurse, Doristine Johns and verified by myself. I also sent home a copy of the DCP consent (protocol version date: 10/07/18, [place] Activation Date: 04/21/2019) and HIPPA (dated  06/07/14) forms for the patient to review. Let patient know, we could discuss consent form on her next visit.   I did complete On Study forms:  History of Falls: Patient stated she has had no falls in the past 6 months. 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  Supplements, Topical Agents and Other Treatments: Reviewed with patient and CRFs completed.Patient does take Advil occasionally, but not for neuropathy symptoms.She also stated she takes a women's daily multivitamin ([facility] Multivitamin Gummy Women's). I looked up ingredients and completed appropriate dosages.  Plan: Patient is scheduled to start chemotherapy withDocetaxel/Trastuzumab Wednesday (01/06/2020).Wewillcomplete baseline neuropathy assessmentsat 8:15AMprior to first chemotherapy treatment.Asked patient to contact research nurse if she has any questions or cannot make this appt for any reason. Isaias Cowman for her time and participation in this clinical research study.    [place] Coordinator, RT (R)(T) 3:33PM

## 2019-12-30 NOTE — Progress Notes (Signed)
  Echocardiogram 2D Echocardiogram has been performed.  Jannett Celestine 12/30/2019, 9:49 AM

## 2019-12-31 ENCOUNTER — Encounter (HOSPITAL_BASED_OUTPATIENT_CLINIC_OR_DEPARTMENT_OTHER): Payer: Self-pay | Admitting: Surgery

## 2019-12-31 ENCOUNTER — Encounter: Payer: Self-pay | Admitting: *Deleted

## 2019-12-31 ENCOUNTER — Telehealth: Payer: Self-pay | Admitting: *Deleted

## 2019-12-31 ENCOUNTER — Other Ambulatory Visit: Payer: Self-pay | Admitting: *Deleted

## 2019-12-31 ENCOUNTER — Other Ambulatory Visit: Payer: Self-pay

## 2019-12-31 DIAGNOSIS — Z170 Estrogen receptor positive status [ER+]: Secondary | ICD-10-CM

## 2019-12-31 DIAGNOSIS — C50411 Malignant neoplasm of upper-outer quadrant of right female breast: Secondary | ICD-10-CM

## 2019-12-31 NOTE — Telephone Encounter (Signed)
Left message for a return phone call to follow up from [facility] last week.  

## 2020-01-01 ENCOUNTER — Telehealth: Payer: Self-pay | Admitting: Genetic Counselor

## 2020-01-01 ENCOUNTER — Encounter (HOSPITAL_BASED_OUTPATIENT_CLINIC_OR_DEPARTMENT_OTHER)
Admission: RE | Admit: 2020-01-01 | Discharge: 2020-01-01 | Disposition: A | Discharge status: Home / Self Care | Payer: BC Managed Care – PPO | Source: Ambulatory Visit | Attending: Surgery | Admitting: Surgery

## 2020-01-01 ENCOUNTER — Ambulatory Visit: Payer: Self-pay | Admitting: Genetic Counselor

## 2020-01-01 ENCOUNTER — Other Ambulatory Visit: Payer: Self-pay | Admitting: Hematology and Oncology

## 2020-01-01 ENCOUNTER — Other Ambulatory Visit (HOSPITAL_COMMUNITY)
Admission: RE | Admit: 2020-01-01 | Discharge: 2020-01-01 | Disposition: A | Discharge status: Home / Self Care | Payer: BC Managed Care – PPO | Source: Ambulatory Visit | Attending: Surgery | Admitting: Surgery

## 2020-01-01 ENCOUNTER — Encounter: Payer: Self-pay | Admitting: *Deleted

## 2020-01-01 ENCOUNTER — Encounter: Payer: Self-pay | Admitting: Genetic Counselor

## 2020-01-01 DIAGNOSIS — Z01812 Encounter for preprocedural laboratory examination: Secondary | ICD-10-CM | POA: Insufficient documentation

## 2020-01-01 DIAGNOSIS — Z20822 Contact with and (suspected) exposure to covid-19: Secondary | ICD-10-CM | POA: Diagnosis not present

## 2020-01-01 DIAGNOSIS — Z1379 Encounter for other screening for genetic and chromosomal anomalies: Secondary | ICD-10-CM

## 2020-01-01 DIAGNOSIS — Z170 Estrogen receptor positive status [ER+]: Secondary | ICD-10-CM

## 2020-01-01 DIAGNOSIS — C50411 Malignant neoplasm of upper-outer quadrant of right female breast: Secondary | ICD-10-CM

## 2020-01-01 LAB — COMPREHENSIVE METABOLIC PANEL
ALT: 14 U/L (ref 0–44)
AST: 17 U/L (ref 15–41)
Albumin: 4.3 g/dL (ref 3.5–5.0)
Alkaline Phosphatase: 58 U/L (ref 38–126)
Anion gap: 11 (ref 5–15)
BUN: 15 mg/dL (ref 6–20)
CO2: 27 mmol/L (ref 22–32)
Calcium: 9.8 mg/dL (ref 8.9–10.3)
Chloride: 100 mmol/L (ref 98–111)
Creatinine, Ser: 0.73 mg/dL (ref 0.44–1.00)
GFR calc Af Amer: >60 mL/min (ref >60)
GFR calc non Af Amer: >60 mL/min (ref >60)
Glucose, Bld: 92 mg/dL (ref 70–99)
Potassium: 4.1 mmol/L (ref 3.5–5.1)
Sodium: 138 mmol/L (ref 135–145)
Total Bilirubin: 0.6 mg/dL (ref 0.3–1.2)
Total Protein: 7.3 g/dL (ref 6.5–8.1)

## 2020-01-01 LAB — CBC WITH DIFFERENTIAL/PLATELET
Abs Immature Granulocytes: 0.02 10*3/uL (ref 0.00–0.07)
Basophils Absolute: 0.1 10*3/uL (ref 0.0–0.1)
Basophils Relative: 1 %
Eosinophils Absolute: 0.1 10*3/uL (ref 0.0–0.5)
Eosinophils Relative: 1 %
HCT: 42.7 % (ref 36.0–46.0)
Hemoglobin: 14 g/dL (ref 12.0–15.0)
Immature Granulocytes: 0 %
Lymphocytes Relative: 35 %
Lymphs Abs: 2.9 10*3/uL (ref 0.7–4.0)
MCH: 30.4 pg (ref 26.0–34.0)
MCHC: 32.8 g/dL (ref 30.0–36.0)
MCV: 92.8 fL (ref 80.0–100.0)
Monocytes Absolute: 0.5 10*3/uL (ref 0.1–1.0)
Monocytes Relative: 6 %
Neutro Abs: 4.7 10*3/uL (ref 1.7–7.7)
Neutrophils Relative %: 57 %
Platelets: 270 10*3/uL (ref 150–400)
RBC: 4.6 MIL/uL (ref 3.87–5.11)
RDW: 12.8 % (ref 11.5–15.5)
WBC: 8.3 10*3/uL (ref 4.0–10.5)
nRBC: 0 % (ref 0.0–0.2)

## 2020-01-01 LAB — SARS CORONAVIRUS 2 (TAT 6-24 HRS): SARS Coronavirus 2: NEGATIVE

## 2020-01-01 MED ORDER — CHLORHEXIDINE GLUCONATE CLOTH 2 % EX PADS: 6.0000 | MEDICATED_PAD | Freq: Once | CUTANEOUS | Status: DC

## 2020-01-01 NOTE — Telephone Encounter (Signed)
Revealed negative genetic testing. Discussed that we do not know why she has breast cancer or why there is cancer in the family. There could be a genetic mutation in the family that Ms. Vanepps did not inherit. There could also be a mutation in a different gene that we are not testing, or our current technology may not be able detect certain mutations. It will therefore be important for her to stay in contact with genetics to keep up with whether additional testing may be appropriate in the future.

## 2020-01-01 NOTE — Progress Notes (Signed)
HPI:  Michelle Anthony was previously seen in the [place] clinic due to a personal and family history of cancer and concerns regarding a hereditary predisposition to cancer. Please refer to our prior cancer genetics clinic note for more information regarding our discussion, assessment and recommendations, at the time. Michelle Anthony's recent genetic test results were disclosed to her, as were recommendations warranted by these results. These results and recommendations are discussed in more detail below.  CANCER HISTORY:  Oncology History  Malignant neoplasm of upper-outer quadrant of right breast in female, estrogen receptor positive ([place])  12/16/2019 Initial Diagnosis   Screening mammogram  showed a right breast asymmetry. Diagnostic mammogram showed a 2.9cm mass at the 9 o'clock position with 2 adjacent masses at the 10 and 8 o'clock positions, 0.9cm and 1.4cm respectively, and an enlarged lymph node, 1.3cm.  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 The report date is 12/31/2019.   The Multi-Cancer Panel offered by [facility] includes sequencing and/or deletion duplication testing of the following 85 genes: AIP, ALK, APC, ATM, AXIN2,BAP1,  BARD1, BLM, BMPR1A, BRCA1, BRCA2, BRIP1, CASR, CDC73, CDH1, CDK4, CDKN1B, CDKN1C, CDKN2A (p14ARF), CDKN2A (p16INK4a), CEBPA, CHEK2, CTNNA1, DICER1, DIS3L2, EGFR (c.2369C>T, p.Thr790Met variant only), EPCAM (Deletion/duplication testing only), FH, FLCN, GATA2, GPC3, GREM1 (Promoter region deletion/duplication testing only), HOXB13 (c.251G>A, p.Gly84Glu), HRAS, KIT, MAX, MEN1, MET, MITF (c.952G>A, p.Glu318Lys variant only), MLH1, MSH2, MSH3, MSH6, MUTYH, NBN, NF1, NF2, NTHL1, PALB2, PDGFRA,  PHOX2B, PMS2, POLD1, POLE, POT1, PRKAR1A, PTCH1, PTEN, RAD50, RAD51C, RAD51D, RB1, RECQL4, RET, RNF43, RUNX1, SDHAF2, SDHA (sequence changes only), SDHB, SDHC, SDHD, SMAD4, SMARCA4, SMARCB1, SMARCE1, STK11, SUFU, TERC, TERT, TMEM127, TP53, TSC1, TSC2, VHL, WRN and WT1.   01/04/2020 -  Chemotherapy   The patient had dexamethasone (DECADRON) 4 MG tablet, 4 mg (100 % of original dose 4 mg), Oral, Daily, 1 of 1 cycle, Start date: 12/23/2019, End date: -- Dose modification: 4 mg (original dose 4 mg, Cycle 0) palonosetron (ALOXI) injection 0.25 mg, 0.25 mg, Intravenous,  Once, 0 of 6 cycles pegfilgrastim-cbqv (UDENYCA) injection 6 mg, 6 mg, Subcutaneous, Once, 0 of 6 cycles CARBOplatin (PARAPLATIN) 540 mg in sodium chloride 0.9 % 250 mL chemo infusion, 540 mg (100 % of original dose 541 mg), Intravenous,  Once, 0 of 6 cycles Dose modification:   (original dose 541 mg, Cycle 1) DOCEtaxel (TAXOTERE) 130 mg in sodium chloride 0.9 % 250 mL chemo infusion, 75 mg/m2 = 130 mg, Intravenous,  Once, 0 of 6 cycles fosaprepitant (EMEND) 150 mg in sodium chloride 0.9 % 145 mL IVPB, 150 mg, Intravenous,  Once, 0 of 6 cycles pertuzumab (PERJETA) 420 mg in sodium chloride 0.9 % 250 mL chemo infusion, 420 mg (100 % of original dose 420 mg), Intravenous, Once, 0 of 6 cycles Dose modification: 420 mg (original dose 420 mg, Cycle 1, Reason: Provider Judgment) trastuzumab-dkst (OGIVRI) 525 mg in sodium chloride 0.9 % 250 mL chemo infusion, 8 mg/kg = 525 mg, Intravenous,  Once, 0 of 6 cycles  for chemotherapy treatment.      FAMILY HISTORY:  We obtained a detailed, 4-generation family history.  Significant diagnoses are listed below: Family History  Problem Relation Age of Onset  . Thyroid disease Mother   .  Osteopenia Mother   . Colon polyps Mother        unknown number  . Thyroid disease Sister   . Cancer Father 24       appendiceal cancer--stage IV  . Diabetes Father 34       thin  . Thyroid disease Maternal  Grandmother   . Breast cancer Maternal Aunt 70  . Breast cancer Paternal Aunt 45  . Diabetes Paternal Aunt   . Diabetes Paternal Uncle   . Liver cancer Paternal Grandmother 38       died of liver cancer at 69  . Breast cancer Cousin 64       M 1st cousin, died at 48  . Melanoma Maternal Uncle 50       blonde/blue eyes  . Melanoma Cousin        dx <50, maternal 1st cousin   Michelle Anthony has one son and one daughter (ages 44 and 43, respectively). She has one brother (age 49) and two sisters who are twins (ages 1). None of these family members have had cancer.  Michelle Anthony mother is 32 and has not had cancer. Michelle Anthony has three maternal aunts and three maternal uncles. One aunt had breast cancer (diagnosed age 24), and one uncle had melanoma (diagnosd age 46). One maternal first cousin died from breast cancer and lung cancer at the age of 43. Another maternal first cousin had melanoma (diagnosed younger than 60). Her maternal grandmother died at the age of 44 from a heart attack, and her maternal grandfather died at the age of 74 from a heart attack.   Michelle Anthony father is 1 and has a history of appendiceal cancer (diagnosed age 5). She has three paternal aunts and one paternal uncle. Her uncle died at the age of 103 from suicide, although it is thought that he may have had cancer. One of her paternal aunts had breast cancer (diagnosed age 67). Her paternal grandmother died at the age of 32 from liver cancer (diagnosed age 50), and her paternal grandfather died at the age of 79 in his sleep.  Michelle Anthony is unaware of previous family history of genetic testing for hereditary cancer risks. Patient's maternal ancestors are of [place] descent, and paternal ancestors are of [place] and [place] descent. There is no reported Ashkenazi Jewish ancestry. There is no known consanguinity.  GENETIC TEST RESULTS: Genetic testing reported out on 12/31/2019 through the [facility] Breast Cancer STAT panel +  Multi-Cancer panel. No pathogenic variants were detected.   The Breast Cancer STAT Panel offered by [facility] includes sequencing and deletion/duplication analysis for the following 9 genes:  ATM, BRCA1, BRCA2, CDH1, CHEK2, PALB2, PTEN, STK11 and TP53. The Multi-Cancer Panel offered by [facility] includes sequencing and/or deletion duplication testing of the following 85 genes: AIP, ALK, APC, ATM, AXIN2,BAP1,  BARD1, BLM, BMPR1A, BRCA1, BRCA2, BRIP1, CASR, CDC73, CDH1, CDK4, CDKN1B, CDKN1C, CDKN2A (p14ARF), CDKN2A (p16INK4a), CEBPA, CHEK2, CTNNA1, DICER1, DIS3L2, EGFR (c.2369C>T, p.Thr790Met variant only), EPCAM (Deletion/duplication testing only), FH, FLCN, GATA2, GPC3, GREM1 (Promoter region deletion/duplication testing only), HOXB13 (c.251G>A, p.Gly84Glu), HRAS, KIT, MAX, MEN1, MET, MITF (c.952G>A, p.Glu318Lys variant only), MLH1, MSH2, MSH3, MSH6, MUTYH, NBN, NF1, NF2, NTHL1, PALB2, PDGFRA, PHOX2B, PMS2, POLD1, POLE, POT1, PRKAR1A, PTCH1, PTEN, RAD50, RAD51C, RAD51D, RB1, RECQL4, RET, RNF43, RUNX1, SDHAF2, SDHA (sequence changes only), SDHB, SDHC, SDHD, SMAD4, SMARCA4, SMARCB1, SMARCE1, STK11, SUFU, TERC, TERT, TMEM127, TP53, TSC1, TSC2, VHL, WRN and WT1. The test report will be scanned into EPIC and located  under the Molecular Pathology section of the Results Review tab.  A portion of the result report is included below for reference.     We discussed with Michelle Anthony that because current genetic testing is not perfect, it is possible there may be a gene mutation in one of these genes that current testing cannot detect, but that chance is small.  We also discussed that there could be another gene that has not yet been discovered, or that we have not yet tested, that is responsible for the cancer diagnoses in the family. It is also possible there is a hereditary cause for the cancer in the family that Michelle Anthony did not inherit and therefore was not identified in her testing.  Therefore, it is important to remain  in touch with cancer genetics in the future so that we can continue to offer Michelle Anthony the most up to date genetic testing.   CANCER SCREENING RECOMMENDATIONS: Michelle Anthony's test result is considered negative (normal).  This means that we have not identified a hereditary cause for her personal and family history of cancer at this time. While reassuring, this does not definitively rule out a hereditary predisposition to cancer. It is still possible that there could be genetic mutations that are undetectable by current technology. There could be genetic mutations in genes that have not been tested or identified to increase cancer risk.  Therefore, it is recommended she continue to follow the cancer management and screening guidelines provided by her oncology and primary healthcare providers.   An individual's cancer risk and medical management are not determined by genetic test results alone. Overall cancer risk assessment incorporates additional factors, including personal medical history, family history, and any available genetic information that may result in a personalized plan for cancer prevention and surveillance.  RECOMMENDATIONS FOR FAMILY MEMBERS:  Individuals in this family might be at some increased risk of developing cancer, over the general population risk, simply due to the family history of cancer.  We recommended women in this family have a yearly mammogram beginning at age 35, or 70 years younger than the earliest onset of cancer, an annual clinical breast exam, and perform monthly breast self-exams. Women in this family should also have a gynecological exam as recommended by their primary provider. All family members should be referred for colonoscopy starting at age 27.  FOLLOW-UP: Lastly, we discussed with Michelle Anthony that cancer genetics is a rapidly advancing field and it is possible that new genetic tests will be appropriate for her and/or her family members in the future. We encouraged her  to remain in contact with cancer genetics on an annual basis so we can update her personal and family histories and let her know of advances in cancer genetics that may benefit this family.   Our contact number was provided. Michelle Anthony's questions were answered to her satisfaction, and she knows she is welcome to call us at anytime with additional questions or concerns.   Clint Guy, MS, [facility] Genetic Counselor [email or web address] Phone: [phone]

## 2020-01-01 NOTE — Telephone Encounter (Signed)
LVM that her genetic test results are available and requested that she call back to discuss them.  

## 2020-01-01 NOTE — Progress Notes (Signed)

## 2020-01-05 ENCOUNTER — Encounter (HOSPITAL_BASED_OUTPATIENT_CLINIC_OR_DEPARTMENT_OTHER)
Admission: RE | Disposition: A | Discharge status: Home / Self Care | Payer: Self-pay | Source: Home / Self Care | Attending: Surgery

## 2020-01-05 ENCOUNTER — Ambulatory Visit (HOSPITAL_BASED_OUTPATIENT_CLINIC_OR_DEPARTMENT_OTHER): Payer: BC Managed Care – PPO | Admitting: Certified Registered Nurse Anesthetist

## 2020-01-05 ENCOUNTER — Ambulatory Visit (HOSPITAL_BASED_OUTPATIENT_CLINIC_OR_DEPARTMENT_OTHER)
Admission: RE | Admit: 2020-01-05 | Discharge: 2020-01-05 | Disposition: A | Discharge status: Home / Self Care | Payer: BC Managed Care – PPO | Attending: Surgery | Admitting: Surgery

## 2020-01-05 ENCOUNTER — Encounter (HOSPITAL_BASED_OUTPATIENT_CLINIC_OR_DEPARTMENT_OTHER): Payer: Self-pay | Admitting: Surgery

## 2020-01-05 ENCOUNTER — Encounter: Payer: Self-pay | Admitting: Radiology

## 2020-01-05 ENCOUNTER — Ambulatory Visit (HOSPITAL_COMMUNITY): Payer: BC Managed Care – PPO

## 2020-01-05 ENCOUNTER — Other Ambulatory Visit: Payer: Self-pay

## 2020-01-05 DIAGNOSIS — C50911 Malignant neoplasm of unspecified site of right female breast: Secondary | ICD-10-CM | POA: Diagnosis present

## 2020-01-05 DIAGNOSIS — F419 Anxiety disorder, unspecified: Secondary | ICD-10-CM | POA: Diagnosis not present

## 2020-01-05 DIAGNOSIS — F329 Major depressive disorder, single episode, unspecified: Secondary | ICD-10-CM | POA: Insufficient documentation

## 2020-01-05 DIAGNOSIS — Z95828 Presence of other vascular implants and grafts: Secondary | ICD-10-CM

## 2020-01-05 DIAGNOSIS — C50411 Malignant neoplasm of upper-outer quadrant of right female breast: Secondary | ICD-10-CM

## 2020-01-05 DIAGNOSIS — E7800 Pure hypercholesterolemia, unspecified: Secondary | ICD-10-CM | POA: Diagnosis not present

## 2020-01-05 DIAGNOSIS — Z419 Encounter for procedure for purposes other than remedying health state, unspecified: Secondary | ICD-10-CM

## 2020-01-05 DIAGNOSIS — Z170 Estrogen receptor positive status [ER+]: Secondary | ICD-10-CM

## 2020-01-05 HISTORY — PX: PORTACATH PLACEMENT: SHX2246

## 2020-01-05 SURGERY — INSERTION, TUNNELED CENTRAL VENOUS DEVICE, WITH PORT
Anesthesia: General | Site: Chest | Laterality: Right

## 2020-01-05 MED ORDER — LIDOCAINE 2% (20 MG/ML) 5 ML SYRINGE
INTRAMUSCULAR | Status: AC
  Filled 2020-01-05: qty 5

## 2020-01-05 MED ORDER — DIPHENHYDRAMINE HCL 50 MG/ML IJ SOLN
INTRAMUSCULAR | Status: AC
  Filled 2020-01-05: qty 1

## 2020-01-05 MED ORDER — DIPHENHYDRAMINE HCL 50 MG/ML IJ SOLN
INTRAMUSCULAR | Status: DC | PRN
  Administered 2020-01-05: 12.5 mg via INTRAVENOUS

## 2020-01-05 MED ORDER — SCOPOLAMINE 1 MG/3DAYS TD PT72
MEDICATED_PATCH | TRANSDERMAL | Status: AC
  Filled 2020-01-05: qty 1

## 2020-01-05 MED ORDER — DEXAMETHASONE SODIUM PHOSPHATE 10 MG/ML IJ SOLN
INTRAMUSCULAR | Status: DC | PRN
  Administered 2020-01-05: 10 mg via INTRAVENOUS

## 2020-01-05 MED ORDER — CEFAZOLIN SODIUM-DEXTROSE 2-4 GM/100ML-% IV SOLN
2.0000 g | INTRAVENOUS | Status: AC
  Administered 2020-01-05: 2 g via INTRAVENOUS

## 2020-01-05 MED ORDER — FENTANYL CITRATE (PF) 100 MCG/2ML IJ SOLN: 25.0000 ug | INTRAMUSCULAR | Status: DC | PRN

## 2020-01-05 MED ORDER — MIDAZOLAM HCL 2 MG/2ML IJ SOLN
INTRAMUSCULAR | Status: AC
  Filled 2020-01-05: qty 2

## 2020-01-05 MED ORDER — MIDAZOLAM HCL 5 MG/5ML IJ SOLN
INTRAMUSCULAR | Status: DC | PRN
  Administered 2020-01-05: 2 mg via INTRAVENOUS

## 2020-01-05 MED ORDER — MIDAZOLAM HCL 2 MG/2ML IJ SOLN: 0.5000 mg | Freq: Once | INTRAMUSCULAR | Status: DC | PRN

## 2020-01-05 MED ORDER — BUPIVACAINE-EPINEPHRINE 0.25% -1:200000 IJ SOLN
INTRAMUSCULAR | Status: DC | PRN
  Administered 2020-01-05: 10 mL

## 2020-01-05 MED ORDER — LACTATED RINGERS IV SOLN: INTRAVENOUS | Status: DC

## 2020-01-05 MED ORDER — PROMETHAZINE HCL 25 MG/ML IJ SOLN: 6.2500 mg | INTRAMUSCULAR | Status: DC | PRN

## 2020-01-05 MED ORDER — IBUPROFEN 800 MG PO TABS: 800.0000 mg | ORAL_TABLET | Freq: Three times a day (TID) | ORAL | 0 refills | Status: DC | PRN

## 2020-01-05 MED ORDER — MEPERIDINE HCL 25 MG/ML IJ SOLN: 6.2500 mg | INTRAMUSCULAR | Status: DC | PRN

## 2020-01-05 MED ORDER — HEPARIN (PORCINE) IN NACL 1000-0.9 UT/500ML-% IV SOLN
INTRAVENOUS | Status: AC
  Filled 2020-01-05: qty 500

## 2020-01-05 MED ORDER — LIDOCAINE 2% (20 MG/ML) 5 ML SYRINGE
INTRAMUSCULAR | Status: DC | PRN
  Administered 2020-01-05: 30 mg via INTRAVENOUS

## 2020-01-05 MED ORDER — OXYCODONE HCL 5 MG PO TABS
ORAL_TABLET | ORAL | Status: AC
Start: 2020-01-05 — End: ?
  Filled 2020-01-05: qty 1

## 2020-01-05 MED ORDER — HEPARIN SOD (PORK) LOCK FLUSH 100 UNIT/ML IV SOLN
INTRAVENOUS | Status: AC
  Filled 2020-01-05: qty 5

## 2020-01-05 MED ORDER — HEPARIN SOD (PORK) LOCK FLUSH 100 UNIT/ML IV SOLN
INTRAVENOUS | Status: DC | PRN
  Administered 2020-01-05: 500 [IU] via INTRAVENOUS

## 2020-01-05 MED ORDER — HYDROCODONE-ACETAMINOPHEN 5-325 MG PO TABS: 1.0000 | ORAL_TABLET | Freq: Four times a day (QID) | ORAL | 0 refills | Status: DC | PRN

## 2020-01-05 MED ORDER — OXYCODONE HCL 5 MG PO TABS
5.0000 mg | ORAL_TABLET | Freq: Once | ORAL | Status: AC | PRN
  Administered 2020-01-05: 5 mg via ORAL

## 2020-01-05 MED ORDER — ONDANSETRON HCL 4 MG/2ML IJ SOLN
INTRAMUSCULAR | Status: AC
  Filled 2020-01-05: qty 2

## 2020-01-05 MED ORDER — BUPIVACAINE HCL (PF) 0.25 % IJ SOLN
INTRAMUSCULAR | Status: AC
  Filled 2020-01-05: qty 30

## 2020-01-05 MED ORDER — FENTANYL CITRATE (PF) 100 MCG/2ML IJ SOLN
INTRAMUSCULAR | Status: AC
  Filled 2020-01-05: qty 2

## 2020-01-05 MED ORDER — DEXAMETHASONE SODIUM PHOSPHATE 10 MG/ML IJ SOLN
INTRAMUSCULAR | Status: AC
  Filled 2020-01-05: qty 1

## 2020-01-05 MED ORDER — EPHEDRINE SULFATE-NACL 50-0.9 MG/10ML-% IV SOSY
PREFILLED_SYRINGE | INTRAVENOUS | Status: DC | PRN
  Administered 2020-01-05 (×2): 15 mg via INTRAVENOUS
  Administered 2020-01-05 (×2): 10 mg via INTRAVENOUS

## 2020-01-05 MED ORDER — PROPOFOL 10 MG/ML IV BOLUS
INTRAVENOUS | Status: DC | PRN
  Administered 2020-01-05: 150 mg via INTRAVENOUS

## 2020-01-05 MED ORDER — ACETAMINOPHEN 500 MG PO TABS
ORAL_TABLET | ORAL | Status: AC
  Filled 2020-01-05: qty 2

## 2020-01-05 MED ORDER — SCOPOLAMINE 1 MG/3DAYS TD PT72
1.0000 | MEDICATED_PATCH | TRANSDERMAL | Status: DC
  Administered 2020-01-05: 1 via TRANSDERMAL

## 2020-01-05 MED ORDER — ONDANSETRON HCL 4 MG/2ML IJ SOLN
INTRAMUSCULAR | Status: DC | PRN
  Administered 2020-01-05: 4 mg via INTRAVENOUS

## 2020-01-05 MED ORDER — HEPARIN (PORCINE) IN NACL 2-0.9 UNITS/ML
INTRAMUSCULAR | Status: AC | PRN
  Administered 2020-01-05: 20 mL via INTRAVENOUS

## 2020-01-05 MED ORDER — OXYCODONE HCL 5 MG/5ML PO SOLN: 5.0000 mg | Freq: Once | ORAL | Status: AC | PRN

## 2020-01-05 MED ORDER — FENTANYL CITRATE (PF) 100 MCG/2ML IJ SOLN
INTRAMUSCULAR | Status: DC | PRN
Start: 2020-01-05 — End: 2020-01-05
  Administered 2020-01-05 (×2): 50 ug via INTRAVENOUS

## 2020-01-05 MED ORDER — ACETAMINOPHEN 500 MG PO TABS
1000.0000 mg | ORAL_TABLET | Freq: Once | ORAL | Status: AC
  Administered 2020-01-05: 1000 mg via ORAL

## 2020-01-05 MED ORDER — EPHEDRINE 5 MG/ML INJ
INTRAVENOUS | Status: AC
  Filled 2020-01-05: qty 10

## 2020-01-05 MED ORDER — CEFAZOLIN SODIUM-DEXTROSE 2-4 GM/100ML-% IV SOLN
INTRAVENOUS | Status: AC
  Filled 2020-01-05: qty 100

## 2020-01-05 SURGICAL SUPPLY — 58 items
ADH SKN CLS APL DERMABOND .7 (GAUZE/BANDAGES/DRESSINGS) ×1
APL PRP STRL LF DISP 70% ISPRP (MISCELLANEOUS) ×1
APL SKNCLS STERI-STRIP NONHPOA (GAUZE/BANDAGES/DRESSINGS)
BAG DECANTER FOR FLEXI CONT (MISCELLANEOUS) ×2 IMPLANT
BENZOIN TINCTURE PRP APPL 2/3 (GAUZE/BANDAGES/DRESSINGS) IMPLANT
BLADE HEX COATED 2.75 (ELECTRODE) IMPLANT
BLADE SURG 11 STRL SS (BLADE) ×2 IMPLANT
BLADE SURG 15 STRL LF DISP TIS (BLADE) ×1 IMPLANT
BLADE SURG 15 STRL SS (BLADE) ×2
CANISTER SUCT 1200ML W/VALVE (MISCELLANEOUS) IMPLANT
CHLORAPREP W/TINT 26 (MISCELLANEOUS) ×2 IMPLANT
COVER BACK TABLE 60X90IN (DRAPES) ×2 IMPLANT
COVER MAYO STAND STRL (DRAPES) ×2 IMPLANT
COVER PROBE 5X48 (MISCELLANEOUS) ×2
COVER WAND RF STERILE (DRAPES) IMPLANT
DECANTER SPIKE VIAL GLASS SM (MISCELLANEOUS) IMPLANT
DERMABOND ADVANCED (GAUZE/BANDAGES/DRESSINGS) ×1
DERMABOND ADVANCED .7 DNX12 (GAUZE/BANDAGES/DRESSINGS) ×1 IMPLANT
DRAPE C-ARM 42X72 X-RAY (DRAPES) ×2 IMPLANT
DRAPE LAPAROSCOPIC ABDOMINAL (DRAPES) ×2 IMPLANT
DRAPE UTILITY XL STRL (DRAPES) ×4 IMPLANT
DRSG TEGADERM 2-3/8X2-3/4 SM (GAUZE/BANDAGES/DRESSINGS) IMPLANT
DRSG TEGADERM 4X4.75 (GAUZE/BANDAGES/DRESSINGS) ×6 IMPLANT
ELECT REM PT RETURN 9FT ADLT (ELECTROSURGICAL) ×2
ELECTRODE REM PT RTRN 9FT ADLT (ELECTROSURGICAL) ×1 IMPLANT
GAUZE SPONGE 4X4 12PLY STRL LF (GAUZE/BANDAGES/DRESSINGS) ×2 IMPLANT
GLOVE BIOGEL PI IND STRL 8 (GLOVE) ×1 IMPLANT
GLOVE BIOGEL PI INDICATOR 8 (GLOVE) ×1
GLOVE ECLIPSE 7.0 STRL STRAW (GLOVE) ×2 IMPLANT
GLOVE ECLIPSE 8.0 STRL XLNG CF (GLOVE) ×2 IMPLANT
GOWN STRL REUS W/ TWL LRG LVL3 (GOWN DISPOSABLE) ×2 IMPLANT
GOWN STRL REUS W/TWL LRG LVL3 (GOWN DISPOSABLE) ×4
IV KIT MINILOC 20X1 SAFETY (NEEDLE) IMPLANT
KIT CVR 48X5XPRB PLUP LF (MISCELLANEOUS) ×1 IMPLANT
KIT PORT POWER 8FR ISP CVUE (Port) ×2 IMPLANT
NDL SAFETY ECLIPSE 18X1.5 (NEEDLE) IMPLANT
NEEDLE HYPO 18GX1.5 SHARP (NEEDLE)
NEEDLE HYPO 22GX1.5 SAFETY (NEEDLE) IMPLANT
NEEDLE HYPO 25X1 1.5 SAFETY (NEEDLE) ×2 IMPLANT
NEEDLE SPNL 22GX3.5 QUINCKE BK (NEEDLE) IMPLANT
PACK BASIN DAY SURGERY FS (CUSTOM PROCEDURE TRAY) ×2 IMPLANT
PENCIL SMOKE EVACUATOR (MISCELLANEOUS) ×2 IMPLANT
SET SHEATH INTRODUCER 10FR (MISCELLANEOUS) IMPLANT
SHEATH COOK PEEL AWAY SET 9F (SHEATH) IMPLANT
SLEEVE SCD COMPRESS KNEE MED (MISCELLANEOUS) ×2 IMPLANT
SPONGE LAP 4X18 RFD (DISPOSABLE) IMPLANT
STRIP CLOSURE SKIN 1/2X4 (GAUZE/BANDAGES/DRESSINGS) IMPLANT
SUT MON AB 4-0 PC3 18 (SUTURE) ×2 IMPLANT
SUT PROLENE 2 0 CT2 30 (SUTURE) IMPLANT
SUT PROLENE 2 0 SH DA (SUTURE) ×2 IMPLANT
SUT SILK 2 0 TIES 17X18 (SUTURE)
SUT SILK 2-0 18XBRD TIE BLK (SUTURE) IMPLANT
SUT VICRYL 3-0 CR8 SH (SUTURE) ×2 IMPLANT
SYR 5ML LUER SLIP (SYRINGE) ×2 IMPLANT
SYR CONTROL 10ML LL (SYRINGE) ×2 IMPLANT
TOWEL GREEN STERILE FF (TOWEL DISPOSABLE) ×4 IMPLANT
TUBE CONNECTING 20X1/4 (TUBING) IMPLANT
YANKAUER SUCT BULB TIP NO VENT (SUCTIONS) IMPLANT

## 2020-01-05 NOTE — Discharge Instructions (Signed)
PORT-A-CATH: POST OP INSTRUCTIONS  Always review your discharge instruction sheet given to you by the facility where your surgery was performed.   1. A prescription for pain medication may be given to you upon discharge. Take your pain medication as prescribed, if needed. If narcotic pain medicine is not needed, then you make take acetaminophen (Tylenol) or ibuprofen (Advil) as needed. Do not take Tylenol until 7:00 pm. Do not take Norco/Vicodin until 9:00pm. 2. Take your usually prescribed medications unless otherwise directed. 3. If you need a refill on your pain medication, please contact our office. All narcotic pain medicine now requires a paper prescription.  Phoned in and fax refills are no longer allowed by law.  Prescriptions will not be filled after 5 pm or on weekends.  4. You should follow a light diet for the remainder of the day after your procedure. 5. Most patients will experience some mild swelling and/or bruising in the area of the incision. It may take several days to resolve. 6. It is common to experience some constipation if taking pain medication after surgery. Increasing fluid intake and taking a stool softener (such as Colace) will usually help or prevent this problem from occurring. A mild laxative (Milk of Magnesia or Miralax) should be taken according to package directions if there are no bowel movements after 48 hours.  7. Unless discharge instructions indicate otherwise, you may remove your bandages 48 hours after surgery, and you may shower at that time. You may have steri-strips (small white skin tapes) in place directly over the incision.  These strips should be left on the skin for 7-10 days.  If your surgeon used Dermabond (skin glue) on the incision, you may shower in 24 hours.  The glue will flake off over the next 2-3 weeks.  8. If your port is left accessed at the end of surgery (needle left in port), the dressing cannot get wet and should only by changed by a  healthcare professional. When the port is no longer accessed (when the needle has been removed), follow step 7.   9. ACTIVITIES:  Limit activity involving your arms for the next 72 hours. Do no strenuous exercise or activity for 1 week. You may drive when you are no longer taking prescription pain medication, you can comfortably wear a seatbelt, and you can maneuver your car. 10.You may need to see your doctor in the office for a follow-up appointment.  Please       check with your doctor.  11.When you receive a new Port-a-Cath, you will get a product guide and        ID card.  Please keep them in case you need them.  WHEN TO CALL YOUR DOCTOR [phone]): 1. Fever over 101.0 2. Chills 3. Continued bleeding from incision 4. Increased redness and tenderness at the site 5. Shortness of breath, difficulty breathing   The clinic staff is available to answer your questions during regular business hours. Please don't hesitate to call and ask to speak to one of the nurses or medical assistants for clinical concerns. If you have a medical emergency, go to the nearest emergency room or call 911.  A surgeon from [facility] is always on call at the hospital.     For further information, please visit [email or web address]   Post Anesthesia Home Care Instructions  Activity: Get plenty of rest for the remainder of the day. A responsible individual must stay with you for 24 hours following the  procedure.  For the next 24 hours, DO NOT: -Drive a car -Paediatric nurse -Drink alcoholic beverages -Take any medication unless instructed by your physician -Make any legal decisions or sign important papers.  Meals: Start with liquid foods such as gelatin or soup. Progress to regular foods as tolerated. Avoid greasy, spicy, heavy foods. If nausea and/or vomiting occur, drink only clear liquids until the nausea and/or vomiting subsides. Call your physician if vomiting  continues.  Special Instructions/Symptoms: Your throat may feel dry or sore from the anesthesia or the breathing tube placed in your throat during surgery. If this causes discomfort, gargle with warm salt water. The discomfort should disappear within 24 hours.  If you had a scopolamine patch placed behind your ear for the management of post- operative nausea and/or vomiting:  1. The medication in the patch is effective for 72 hours, after which it should be removed.  Wrap patch in a tissue and discard in the trash. Wash hands thoroughly with soap and water. 2. You may remove the patch earlier than 72 hours if you experience unpleasant side effects which may include dry mouth, dizziness or visual disturbances. 3. Avoid touching the patch. Wash your hands with soap and water after contact with the patch.

## 2020-01-05 NOTE — Transfer of Care (Signed)
Immediate Anesthesia Transfer of Care Note  Patient: Michelle Anthony  Procedure(s) Performed: INSERTION PORT-A-CATH WITH ULTRASOUND GUIDANCE (Right Chest)  Patient Location: PACU  Anesthesia Type:General  Level of Consciousness: awake, alert  and oriented  Airway & Oxygen Therapy: Patient Spontanous Breathing and Patient connected to face mask oxygen  Post-op Assessment: Report given to RN and Post -op Vital signs reviewed and stable  Post vital signs: Reviewed and stable  Last Vitals:  Vitals Value Taken Time  BP 103/61   Temp    Pulse 73 01/05/20 1359  Resp 12 01/05/20 1359  SpO2 98 % 01/05/20 1359  Vitals shown include unvalidated device data.  Last Pain:  Vitals:   01/05/20 1200  TempSrc: Oral  PainSc: 0-No pain      Patients Stated Pain Goal: 3 (41/03/01 3143)  Complications: No complications documented.

## 2020-01-05 NOTE — Research (Signed)
S1714, A PROSPECTIVE OBSERVATIONAL COHORT STUDY TO DEVELOP A PREDICTIVE MODEL OFTAXANE-INDUCED PERIPHERAL NEUROPATHY IN CANCER PATIENTS.  01/05/20  10:30AM  PHONE CALL: Confirmed I was speaking with [facility] and explained the purpose the call was to remind patient to please bring completed questionnaires in the morning so we are able to register her prior to her first treatment. Patient stated she has completed questionnaires and will bring them in the morning. I informed her that she would meet Rubin Payor, [place] Nurse and we would complete her neuropathy assessments after she sees Dr. Lindi Adie. I asked patient if she was interested in participating in DCP and she stated she is interested, but we were unable to complete consent over the phone. The plan is to speak to patient tomorrow and sign DCP at that point. Patient did not have any questions or concerns and I thanked her for her time.  Carol Ada, RT(R)(T) Clinical Research Coordinator

## 2020-01-05 NOTE — Progress Notes (Signed)
Patient Care Team: Katherina Mires, MD as PCP - General (Family Medicine) Rockwell Germany, RN as Oncology Nurse Navigator Mauro Kaufmann, RN as Oncology Nurse Navigator Erroll Luna, MD as Consulting Physician (General Surgery) Nicholas Lose, MD as Consulting Physician (Hematology and Oncology) Gery Pray, MD as Consulting Physician (Radiation Oncology)  DIAGNOSIS:    ICD-10-CM   1. Malignant neoplasm of upper-outer quadrant of right breast in female, estrogen receptor positive ([place])  C50.411    Z17.0     SUMMARY OF ONCOLOGIC HISTORY: Oncology History  Malignant neoplasm of upper-outer quadrant of right breast in female, estrogen receptor positive ([place])  12/16/2019 Initial Diagnosis   Screening mammogram  showed a right breast asymmetry. Diagnostic mammogram showed a 2.9cm mass at the 9 o'clock position with 2 adjacent masses at the 10 and 8 o'clock positions, 0.9cm and 1.4cm respectively, and an enlarged lymph node, 1.3cm.  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 The report date is 12/31/2019.   The Multi-Cancer Panel offered by [facility] includes sequencing and/or deletion duplication testing of the following 85 genes: AIP, ALK, APC, ATM, AXIN2,BAP1,  BARD1, BLM, BMPR1A, BRCA1, BRCA2, BRIP1, CASR, CDC73, CDH1, CDK4, CDKN1B, CDKN1C, CDKN2A (p14ARF), CDKN2A (p16INK4a), CEBPA, CHEK2, CTNNA1, DICER1, DIS3L2, EGFR (c.2369C>T, p.Thr790Met variant only), EPCAM (Deletion/duplication testing only), FH, FLCN, GATA2, GPC3, GREM1 (Promoter region deletion/duplication testing only), HOXB13 (c.251G>A, p.Gly84Glu), HRAS, KIT, MAX, MEN1, MET, MITF (c.952G>A, p.Glu318Lys variant only), MLH1, MSH2, MSH3, MSH6, MUTYH, NBN, NF1,  NF2, NTHL1, PALB2, PDGFRA, PHOX2B, PMS2, POLD1, POLE, POT1, PRKAR1A, PTCH1, PTEN, RAD50, RAD51C, RAD51D, RB1, RECQL4, RET, RNF43, RUNX1, SDHAF2, SDHA (sequence changes only), SDHB, SDHC, SDHD, SMAD4, SMARCA4, SMARCB1, SMARCE1, STK11, SUFU, TERC, TERT, TMEM127, TP53, TSC1, TSC2, VHL, WRN and WT1.   01/06/2020 -  Chemotherapy   The patient had dexamethasone (DECADRON) 4 MG tablet, 4 mg (100 % of original dose 4 mg), Oral, Daily, 1 of 1 cycle, Start date: 12/23/2019, End date: -- Dose modification: 4 mg (original dose 4 mg, Cycle 0) palonosetron (ALOXI) injection 0.25 mg, 0.25 mg, Intravenous,  Once, 0 of 6 cycles pegfilgrastim-cbqv (UDENYCA) injection 6 mg, 6 mg, Subcutaneous, Once, 0 of 6 cycles CARBOplatin (PARAPLATIN) 540 mg in sodium chloride 0.9 % 250 mL chemo infusion, 540 mg (100 % of original dose 541 mg), Intravenous,  Once, 0 of 6 cycles Dose modification:   (original dose 541 mg, Cycle 1) DOCEtaxel (TAXOTERE) 130 mg in sodium chloride 0.9 % 250 mL chemo infusion, 75 mg/m2 = 130 mg, Intravenous,  Once, 0 of 6 cycles fosaprepitant (EMEND) 150 mg in sodium chloride 0.9 % 145 mL IVPB, 150 mg, Intravenous,  Once, 0 of 6 cycles pertuzumab (PERJETA) 420 mg in sodium chloride 0.9 % 250 mL chemo infusion, 420 mg (100 % of original dose 420 mg), Intravenous, Once, 0 of 6 cycles Dose modification: 420 mg (original dose 420 mg, Cycle 1, Reason: Provider Judgment) trastuzumab-dkst (OGIVRI) 525 mg in sodium chloride 0.9 % 250 mL chemo infusion, 8 mg/kg = 525 mg, Intravenous,  Once, 0 of 6 cycles  for chemotherapy treatment.      CHIEF COMPLIANT: Cycle 1 TCH Perjeta  INTERVAL HISTORY: Michelle Anthony is a 54 y.o. with above-mentioned history of  right breast cancer currently on neoadjuvant chemotherapy with [place]. Echo on 12/30/19 showed an ejection fraction of 50-55%. Her prot was placed by Dr. Brantley Stage on 01/05/20. She presents to the clinic today for cycle 1.  ALLERGIES:  has No Known  Allergies.  MEDICATIONS:  Current Outpatient Medications  Medication Sig Dispense Refill  . COLLAGEN PO Apply topically daily.    [name] dexamethasone (DECADRON) 4 MG tablet Take 1 tablet (4 mg total) by mouth daily. Take 1 tablet day before chemo and 1 tablet 1 day after chemo with food 12 tablet 0  . HYDROcodone-acetaminophen (NORCO/VICODIN) 5-325 MG tablet Take 1 tablet by mouth every 6 (six) hours as needed for moderate pain. 15 tablet 0  . ibuprofen (ADVIL) 800 MG tablet Take 1 tablet (800 mg total) by mouth every 8 (eight) hours as needed. 30 tablet 0  . lidocaine-prilocaine (EMLA) cream Apply to affected area once 30 g 3  . LORazepam (ATIVAN) 0.5 MG tablet Take 1 tablet (0.5 mg total) by mouth every 6 (six) hours as needed for sleep. 30 tablet 0  . Multiple Vitamin (MULTIVITAMIN) tablet Take 1 tablet by mouth daily.    . ondansetron (ZOFRAN) 8 MG tablet Take 1 tablet (8 mg total) by mouth 2 (two) times daily as needed (Nausea or vomiting). Start on the third day after chemotherapy. 30 tablet 1  . prochlorperazine (COMPAZINE) 10 MG tablet Take 1 tablet (10 mg total) by mouth every 6 (six) hours as needed (Nausea or vomiting). 30 tablet 1  . Tretinoin (RETIN-A EX) Apply topically.     No current facility-administered medications for this visit.    PHYSICAL EXAMINATION: ECOG PERFORMANCE STATUS: 1 - Symptomatic but completely ambulatory  Vitals:   01/06/20 0818  BP: 109/65  Pulse: (!) 53  Resp: 17  Temp: (!) 97.2 F (36.2 C)  SpO2: 100%   Filed Weights   01/06/20 0818  Weight: 148 lb 1.6 oz (67.2 kg)    LABORATORY DATA:  I have reviewed the data as listed CMP Latest Ref Rng & Units 01/01/2020 12/23/2019 12/16/2019  Glucose 70 - 99 mg/dL 92 85 87  BUN 6 - 20 mg/dL 15 14 21   Creatinine 0.44 - 1.00 mg/dL 0.73 0.71 0.73  Sodium 135 - 145 mmol/L 138 143 141  Potassium 3.5 - 5.1 mmol/L 4.1 3.8 4.1  Chloride 98 - 111 mmol/L 100 105 101  CO2 22 - 32 mmol/L 27 30 26   Calcium 8.9 - 10.3  mg/dL 9.8 9.8 9.8  Total Protein 6.5 - 8.1 g/dL 7.3 7.3 7.1  Total Bilirubin 0.3 - 1.2 mg/dL 0.6 0.5 <0.2  Alkaline Phos 38 - 126 U/L 58 62 72  AST 15 - 41 U/L 17 13(L) 13  ALT 0 - 44 U/L 14 14 10     Lab Results  Component Value Date   WBC 8.8 01/06/2020   HGB 12.4 01/06/2020   HCT 36.7 01/06/2020   MCV 92.0 01/06/2020   PLT 223 01/06/2020   NEUTROABS 7.0 01/06/2020    ASSESSMENT & PLAN:  Malignant neoplasm of upper-outer quadrant of right breast in female, estrogen receptor positive ([place]) 12/09/2019:Screening mammogram  showed a right breast asymmetry. Diagnostic mammogram showed a 2.9cm mass at the 9 o'clock position with 2 adjacent masses at the 10 and 8 o'clock positions, 0.9cm and 1.4cm respectively, and an enlarged lymph node, 1.3cm.  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 Neoadjuvant chemotherapy with TCH Perjeta 6 cycles followed by Herceptin and Perjeta versus Kadcyla maintenance for 1 year (also could be randomized compass HER 2 trial) 2. Followed by breast conserving surgery if possible with sentinel lymph node study 3. Followed by adjuvant radiation therapy if patient had lumpectomy 4.  Followed by adjuvant antiestrogen therapy SWOG S 1714 neuropathy clinical trial: No adverse effects from participating in the trial 12/31/2019: Breast MRI: Known malignancy 3.1 cm, with non-mass enhancement total measuring 4.2 cm.  Additional masses 6 mm and 3 and 4 mm --------------------------------------------------------------------------------------------------------------------------------------------- Current treatment: Cycle 1 day 1 TCH Perjeta Chemo consent obtained, chemo education completed, antiemetics were reviewed Echocardiogram 12/30/2019: EF 50 to 55%  MRI guided biopsy will need to be performed on the 2 additional  masses. Return to clinic in 1 week for toxicity check     No orders of the defined types were placed in this encounter.  The patient has a good understanding of the overall plan. she agrees with it. she will call with any problems that may develop before the next visit here.  Total time spent: 30 mins including face to face time and time spent for planning, charting and coordination of care  Nicholas Lose, MD 01/06/2020  I, Cloyde Reams Dorshimer, am acting as scribe for Dr. Nicholas Lose.  I have reviewed the above documentation for accuracy and completeness, and I agree with the above.

## 2020-01-05 NOTE — Op Note (Signed)
Preoperative diagnosis: PAC needed for chemotherapy   Postoperative diagnosis: Same  Procedure: Portacath Placement right internal jugular with C arm and U/S guidance   Surgeon: Turner Daniels, MD, FACS  Anesthesia: General and 0.25 % marcaine with epinephrine  Clinical History and Indications: The patient is getting ready to begin chemotherapy for her cancer. She  needs a Port-A-Cath for venous access. Risk of bleeding, infection,  Collapse lung,  Death,  DVT,  Organ injury,  Mediastinal injury,  Injury to heart,  Injury to blood vessels,  Nerves,  Migration of catheter,  Embolization of catheter and the need for more surgery.  Description of Procedure: I have seen the patient in the holding area and confirmed the plans for the procedure as noted above. I reviewed the risks and complications again and the patient has no further questions. She wishes to proceed.   The patient was then taken to the operating room. After satisfactory general  anesthesia had been obtained the upper chest and lower neck were prepped and draped as a sterile field. The timeout was done.  The right internal jugular vein  was entered under U/S guidance  and the guidewire threaded into the superior vena cava right atrial area under fluoroscopic guidance. An incision was then made on the anterior chest wall and a subcutaneous pocket fashioned for the port reservoir.  The port tubing was then brought through a subcutaneous tunnel from the port site to the guidewire site.  The port and catheter were attached, locked  and flushed. The catheter was measured and cut to appropriate length.The dilator and peel-away sheath were then advanced over the guidewire while monitoring this with fluoroscopy. The guidewire and dilator were removed and the tubing threaded to approximately 19 cm. The peel-away sheath was then removed. The catheter aspirated and flushed easily. Using fluoroscopy the tip was in the superior vena cava right  atrial junction area. It aspirated and flushed easily. That aspirated and flushed easily.  The reservoir was secured to the fascia with 1 sutures of 2-0 Prolene. A final check with fluoroscopy was done to make sure we had no kinks and good positioning of the tip of the catheter. Everything appeared to be okay. The catheter was aspirated, flushed with dilute heparin and then concentrated aqueous heparin. The port was left accessed.    The incision was then closed with interrupted 3-0 Vicryl, and 4-0 Monocryl subcuticular with Dermabond on the skin.  There were no operative complications. Estimated blood loss was minimal. All counts were correct. The patient tolerated the procedure well.  Turner Daniels, MD, FACS

## 2020-01-05 NOTE — Anesthesia Procedure Notes (Signed)
Procedure Name: LMA Insertion Date/Time: 01/05/2020 1:03 PM Performed by: Genelle Bal, CRNA Pre-anesthesia Checklist: Patient identified, Emergency Drugs available, Suction available and Patient being monitored Patient Re-evaluated:Patient Re-evaluated prior to induction Oxygen Delivery Method: Circle system utilized Preoxygenation: Pre-oxygenation with 100% oxygen Induction Type: IV induction Ventilation: Mask ventilation without difficulty LMA: LMA inserted LMA Size: 4.0 Number of attempts: 1 Airway Equipment and Method: Bite block Placement Confirmation: positive ETCO2 Tube secured with: Tape Dental Injury: Teeth and Oropharynx as per pre-operative assessment

## 2020-01-05 NOTE — Progress Notes (Signed)
Pharmacist Chemotherapy Monitoring - Initial Assessment    Anticipated start date: 01/06/20    Regimen:  . Are orders appropriate based on the patient's diagnosis, regimen, and cycle? Yes . Does the plan date match the patient's scheduled date? Yes . Is the sequencing of drugs appropriate? Yes . Are the premedications appropriate for the patient's regimen? Yes . Prior Authorization for treatment is: Approved o If applicable, is the correct biosimilar selected based on the patient's insurance? yes  Organ Function and Labs: [name] Are dose adjustments needed based on the patient's renal function, hepatic function, or hematologic function? Yes . Are appropriate labs ordered prior to the start of patient's treatment? Yes . Other organ system assessment, if indicated: trastuzumab: Echo/ MUGA and pertuzumab: Echo/ MUGA . The following baseline labs, if indicated, have been ordered: N/A  Dose Assessment: . Are the drug doses appropriate? Yes . Are the following correct: o Drug concentrations Yes o IV fluid compatible with drug Yes o Administration routes Yes o Timing of therapy Yes . If applicable, does the patient have documented access for treatment and/or plans for port-a-cath placement? yes . If applicable, have lifetime cumulative doses been properly documented and assessed? yes Lifetime Dose Tracking  No doses have been documented on this patient for the following tracked chemicals: Doxorubicin, Epirubicin, Idarubicin, Daunorubicin, Mitoxantrone, Bleomycin, Oxaliplatin, Carboplatin, Liposomal Doxorubicin  o   Toxicity Monitoring/Prevention: . The patient has the following take home antiemetics prescribed: Ondansetron . The patient has the following take home medications prescribed: N/A . Medication allergies and previous infusion related reactions, if applicable, have been reviewed and addressed. Yes . The patient's current medication list has been assessed for drug-drug interactions with  their chemotherapy regimen. no significant drug-drug interactions were identified on review.  Order Review: . Are the treatment plan orders signed? Yes . Is the patient scheduled to see a provider prior to their treatment? Yes  I verify that I have reviewed each item in the above checklist and answered each question accordingly.  Romualdo Bolk [facility] 01/05/2020 1:21 PM

## 2020-01-05 NOTE — Interval H&P Note (Signed)
History and Physical Interval Note:  01/05/2020 12:53 PM  Michelle Anthony  has presented today for surgery, with the diagnosis of BREAST CANCER/POOR VENOUS ACCESS.  The various methods of treatment have been discussed with the patient and family. After consideration of risks, benefits and other options for treatment, the patient has consented to  Procedure(s): INSERTION PORT-A-CATH WITH ULTRASOUND GUIDANCE (N/A) as a surgical intervention.  The patient's history has been reviewed, patient examined, no change in status, stable for surgery.  I have reviewed the patient's chart and labs.  Questions were answered to the patient's satisfaction.     Turner Daniels MD

## 2020-01-05 NOTE — Anesthesia Preprocedure Evaluation (Addendum)
Anesthesia Evaluation  Patient identified by MRN, date of birth, ID band Patient awake    Reviewed: Allergy & Precautions, NPO status , Patient's Chart, lab work & pertinent test results  History of Anesthesia Complications Negative for: history of anesthetic complications  Airway Mallampati: I  TM Distance: >3 FB Neck ROM: Full    Dental  (+) Dental Advisory Given   Pulmonary former smoker,  01/01/2020 SARS coronavirus neg   breath sounds clear to auscultation       Cardiovascular negative cardio ROS   Rhythm:Regular Rate:Normal  12/30/2019 ECHO: LV function low normal, EF 50-55%, mild prolapse of anterior leaflet of the mitral valve, trivial MR   Neuro/Psych negative neurological ROS     GI/Hepatic negative GI ROS, Neg liver ROS,   Endo/Other  negative endocrine ROS  Renal/GU negative Renal ROS     Musculoskeletal   Abdominal   Peds  Hematology negative hematology ROS (+)   Anesthesia Other Findings   Reproductive/Obstetrics                            Anesthesia Physical Anesthesia Plan  ASA: II  Anesthesia Plan: General   Post-op Pain Management:    Induction: Intravenous  PONV Risk Score and Plan: 3 and Ondansetron, Dexamethasone and Treatment may vary due to age or medical condition  Airway Management Planned: LMA  Additional Equipment: None  Intra-op Plan:   Post-operative Plan:   Informed Consent: I have reviewed the patients History and Physical, chart, labs and discussed the procedure including the risks, benefits and alternatives for the proposed anesthesia with the patient or authorized representative who has indicated his/her understanding and acceptance.     Dental advisory given  Plan Discussed with: CRNA and Surgeon  Anesthesia Plan Comments:        Anesthesia Quick Evaluation

## 2020-01-05 NOTE — Anesthesia Postprocedure Evaluation (Signed)
Anesthesia Post Note  Patient: [name]  Procedure(s) Performed: INSERTION PORT-A-CATH WITH ULTRASOUND GUIDANCE (Right Chest)     Patient location during evaluation: Phase II Anesthesia Type: General Level of consciousness: awake and alert, patient cooperative and oriented Pain management: pain level controlled Vital Signs Assessment: post-procedure vital signs reviewed and stable Respiratory status: spontaneous breathing, nonlabored ventilation and respiratory function stable Cardiovascular status: blood pressure returned to baseline and stable Postop Assessment: no apparent nausea or vomiting, adequate PO intake and able to ambulate Anesthetic complications: no   No complications documented.  Last Vitals:  Vitals:   01/05/20 1430 01/05/20 1445  BP: (!) 102/59 (!) 101/59  Pulse: 75 61  Resp: (!) 24 20  Temp:  36.7 C  SpO2: 99% 98%    Last Pain:  Vitals:   01/05/20 1445  TempSrc:   PainSc: 3                  Lashaun Krapf,E. Trase Bunda

## 2020-01-06 ENCOUNTER — Inpatient Hospital Stay: Payer: BC Managed Care – PPO

## 2020-01-06 ENCOUNTER — Encounter: Payer: Self-pay | Admitting: Medical Oncology

## 2020-01-06 ENCOUNTER — Encounter (HOSPITAL_BASED_OUTPATIENT_CLINIC_OR_DEPARTMENT_OTHER): Payer: Self-pay | Admitting: Surgery

## 2020-01-06 ENCOUNTER — Encounter: Payer: Self-pay | Admitting: *Deleted

## 2020-01-06 ENCOUNTER — Other Ambulatory Visit: Payer: Self-pay

## 2020-01-06 ENCOUNTER — Inpatient Hospital Stay: Payer: BC Managed Care – PPO | Admitting: Hematology and Oncology

## 2020-01-06 VITALS — BP 97/70 | HR 50 | Temp 99.2°F | Resp 18

## 2020-01-06 DIAGNOSIS — C50411 Malignant neoplasm of upper-outer quadrant of right female breast: Secondary | ICD-10-CM

## 2020-01-06 DIAGNOSIS — Z170 Estrogen receptor positive status [ER+]: Secondary | ICD-10-CM

## 2020-01-06 DIAGNOSIS — K219 Gastro-esophageal reflux disease without esophagitis: Secondary | ICD-10-CM

## 2020-01-06 LAB — CBC WITH DIFFERENTIAL (CANCER CENTER ONLY)
Abs Immature Granulocytes: 0.01 10*3/uL (ref 0.00–0.07)
Basophils Absolute: 0 10*3/uL (ref 0.0–0.1)
Basophils Relative: 0 %
Eosinophils Absolute: 0 10*3/uL (ref 0.0–0.5)
Eosinophils Relative: 0 %
HCT: 36.7 % (ref 36.0–46.0)
Hemoglobin: 12.4 g/dL (ref 12.0–15.0)
Immature Granulocytes: 0 %
Lymphocytes Relative: 13 %
Lymphs Abs: 1.1 10*3/uL (ref 0.7–4.0)
MCH: 31.1 pg (ref 26.0–34.0)
MCHC: 33.8 g/dL (ref 30.0–36.0)
MCV: 92 fL (ref 80.0–100.0)
Monocytes Absolute: 0.6 10*3/uL (ref 0.1–1.0)
Monocytes Relative: 7 %
Neutro Abs: 7 10*3/uL (ref 1.7–7.7)
Neutrophils Relative %: 80 %
Platelet Count: 223 10*3/uL (ref 150–400)
RBC: 3.99 MIL/uL (ref 3.87–5.11)
RDW: 12.7 % (ref 11.5–15.5)
WBC Count: 8.8 10*3/uL (ref 4.0–10.5)
nRBC: 0 % (ref 0.0–0.2)

## 2020-01-06 LAB — CMP (CANCER CENTER ONLY)
ALT: 10 U/L (ref 0–44)
AST: 13 U/L — ABNORMAL LOW (ref 15–41)
Albumin: 3.8 g/dL (ref 3.5–5.0)
Alkaline Phosphatase: 58 U/L (ref 38–126)
Anion gap: 7 (ref 5–15)
BUN: 18 mg/dL (ref 6–20)
CO2: 26 mmol/L (ref 22–32)
Calcium: 9.5 mg/dL (ref 8.9–10.3)
Chloride: 107 mmol/L (ref 98–111)
Creatinine: 0.76 mg/dL (ref 0.44–1.00)
GFR, Est AFR Am: >60 mL/min (ref >60)
GFR, Estimated: >60 mL/min (ref >60)
Glucose, Bld: 110 mg/dL — ABNORMAL HIGH (ref 70–99)
Potassium: 3.9 mmol/L (ref 3.5–5.1)
Sodium: 140 mmol/L (ref 135–145)
Total Bilirubin: 0.4 mg/dL (ref 0.3–1.2)
Total Protein: 6.9 g/dL (ref 6.5–8.1)

## 2020-01-06 LAB — RESEARCH LABS

## 2020-01-06 MED ORDER — SODIUM CHLORIDE 0.9 % IV SOLN
Freq: Once | INTRAVENOUS | Status: AC
  Filled 2020-01-06: qty 250

## 2020-01-06 MED ORDER — SODIUM CHLORIDE 0.9% FLUSH
10.0000 mL | INTRAVENOUS | Status: DC | PRN
  Administered 2020-01-06: 10 mL
  Filled 2020-01-06: qty 10

## 2020-01-06 MED ORDER — DIPHENHYDRAMINE HCL 25 MG PO CAPS
50.0000 mg | ORAL_CAPSULE | Freq: Once | ORAL | Status: AC
  Administered 2020-01-06: 50 mg via ORAL

## 2020-01-06 MED ORDER — ACETAMINOPHEN 325 MG PO TABS
ORAL_TABLET | ORAL | Status: AC
  Filled 2020-01-06: qty 2

## 2020-01-06 MED ORDER — SODIUM CHLORIDE 0.9 % IV SOLN
10.0000 mg | Freq: Once | INTRAVENOUS | Status: AC
  Administered 2020-01-06: 10 mg via INTRAVENOUS
  Filled 2020-01-06: qty 10

## 2020-01-06 MED ORDER — HEPARIN SOD (PORK) LOCK FLUSH 100 UNIT/ML IV SOLN
500.0000 [IU] | Freq: Once | INTRAVENOUS | Status: AC | PRN
  Administered 2020-01-06: 500 [IU]
  Filled 2020-01-06: qty 5

## 2020-01-06 MED ORDER — SODIUM CHLORIDE 0.9 % IV SOLN
541.0000 mg | Freq: Once | INTRAVENOUS | Status: AC
  Administered 2020-01-06: 540 mg via INTRAVENOUS
  Filled 2020-01-06: qty 54

## 2020-01-06 MED ORDER — DIPHENHYDRAMINE HCL 25 MG PO CAPS
ORAL_CAPSULE | ORAL | Status: AC
  Filled 2020-01-06: qty 2

## 2020-01-06 MED ORDER — PALONOSETRON HCL INJECTION 0.25 MG/5ML
INTRAVENOUS | Status: AC
  Filled 2020-01-06: qty 5

## 2020-01-06 MED ORDER — TRASTUZUMAB-DKST CHEMO 150 MG IV SOLR
8.0000 mg/kg | Freq: Once | INTRAVENOUS | Status: AC
  Administered 2020-01-06: 525 mg via INTRAVENOUS
  Filled 2020-01-06: qty 25

## 2020-01-06 MED ORDER — ALUM & MAG HYDROXIDE-SIMETH 200-200-20 MG/5ML PO SUSP
30.0000 mL | Freq: Once | ORAL | Status: AC
  Administered 2020-01-06: 30 mL via ORAL

## 2020-01-06 MED ORDER — PALONOSETRON HCL INJECTION 0.25 MG/5ML
0.2500 mg | Freq: Once | INTRAVENOUS | Status: AC
  Administered 2020-01-06: 0.25 mg via INTRAVENOUS

## 2020-01-06 MED ORDER — ACETAMINOPHEN 325 MG PO TABS
650.0000 mg | ORAL_TABLET | Freq: Once | ORAL | Status: AC
  Administered 2020-01-06: 650 mg via ORAL

## 2020-01-06 MED ORDER — SODIUM CHLORIDE 0.9 % IV SOLN
75.0000 mg/m2 | Freq: Once | INTRAVENOUS | Status: AC
  Administered 2020-01-06: 130 mg via INTRAVENOUS
  Filled 2020-01-06: qty 13

## 2020-01-06 MED ORDER — ALUM & MAG HYDROXIDE-SIMETH 200-200-20 MG/5ML PO SUSP
ORAL | Status: AC
  Filled 2020-01-06: qty 30

## 2020-01-06 MED ORDER — SODIUM CHLORIDE 0.9 % IV SOLN
150.0000 mg | Freq: Once | INTRAVENOUS | Status: AC
  Administered 2020-01-06: 150 mg via INTRAVENOUS
  Filled 2020-01-06: qty 150

## 2020-01-06 MED ORDER — SODIUM CHLORIDE 0.9 % IV SOLN
420.0000 mg | Freq: Once | INTRAVENOUS | Status: AC
  Administered 2020-01-06: 420 mg via INTRAVENOUS
  Filled 2020-01-06: qty 14

## 2020-01-06 NOTE — Research (Signed)
DCP-001, Use of a Clinical Trial Screening Tool to Address Cancer Health Disparities in the [place] Program  [facility]) Dr. Lindi Adie referred patient for study [phone] and patient is eligible for participation in DCP-001. I met with patient when in clinic today and reviewed what the purpose of the study is. The patient is aware that study involvement consists of a one-time consent and collection of demographic variables, with the majority of data collected form her medical record. I noted to the patient that no patient identifiers are being reported via the screening tool. The consent and authorization forms were reviewed in entirety with the patient. All patient's questions answered to her satisfaction. Patient signed and dated the consent and authorization forms and copies of these signed documents were provided to her, for her records. Patient was thanked for her time and support of study and was encouraged to call with questions.   Patient meets eligibility and will be enrolled in the DCP-001 study.  Maxwell Marion, RN, BSN, [facility] Clinical Research 01/06/2020 2:19 PM

## 2020-01-06 NOTE — Progress Notes (Signed)
Nutrition Assessment   Reason for Assessment: Patient identified by attending Breast Clinic   ASSESSMENT:  55 year old female with right breast cancer.  Patient receiving neoadjuvant chemotherapy.    Met with patient during infusion.  Patient with good appetite.  Eating hummus and pretzel snack just prior to meeting with RD.  Has a well balanced diet including plant foods and animal products.  Does not like cow's milk but drinks almond milk.     Medications: zofran, compazine, ativan, MVI, decadron   Labs: reviewed   Anthropometrics:   Height: 65 inches Weight: 148 lb 1.6 oz on 9/22 145 lb on 12/16/2019 BMI: 24   NUTRITION DIAGNOSIS: Food and nutrition related knowledge deficit related to new diagnosis of cancer and receiving chemotherapy as evidenced by questions regarding foods to eat during treatment   INTERVENTION:  Reviewed foods recommended to try when having nausea and vomiting.  Handout provided as well as recipes. Encouraged well balanced diet as tolerated including good sources of protein.  Contact information provided to patient    MONITORING, EVALUATION, GOAL: weight trends, intake   Next Visit: October 11 during infusion  Mrytle Bento B. Zenia Resides, [place], [place] Registered Dietitian [phone] (mobile)

## 2020-01-06 NOTE — Progress Notes (Signed)
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 Baseline and 1st Taxane visit:  Patient presented to the clinic with her daughter, for her first treatment. I met with patient after her lab and MD appointment.  Patient's medical history and concomitant medications were reviewed by [facility] Carol Ada, who also provided the questionnaires for patient to complete. 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    PROs: Questionnaires were provided to patient to complete after signing of consent and prior to registration. Collected questionnaires and checked for completeness and accuracy.  Registration: Patient meets all eligibility requirements to be enrolled on the S1714 study.  Eligibility also confirmed by research RN, Doristine Johns and [facility] Carol Ada. Dr. Lindi Adie agrees patient meets eligiblity and patient was registered to the S1714 study via Open and assigned patient ID [id number] Labs: Baseline mandatory research labs were collected, per protocol, along with other standard of care labs ordered by provider, including serum creatinine level. Physician Assessments: CTCAE and Treatment Burden forms completed and signed by Dr. Lindi Adie.  History of Falls: Patient confirms having one fall in shower in the past 6 months.  Medical and Smoking History: Reviewed with patient and CRFs completed.  Assessment for Interventions for CIPN: Reviewed with patient and CRFs completed. Neuropen Assessment: Completed per protocol by this certified research RN, and recorded by Remer Macho, Research Specialist,. [place] Assessment: Completed per protocol by this certified Electrical engineer, and recorded by Remer Macho, Research Specialist,. Timed Get Up and Go Test: Completed per protocol by this trained research RN.  First Taxane Blood Draw: Did not occur today due to  timing of taxane infusion Plan: Informed patient of next study assessments in approximately 4 weeks and will be done on same day as treatment.   Patient denied having any questions at this time. Patient was informed that research nurse, Doristine Johns will follow-up with her prior to her next study visit.  Patient was thanked for her time and contribution to study and was encouraged to call clinic for any questions or concerns she may have prior to her next appointment.  Maxwell Marion, RN, BSN, [facility] Clinical Research 01/06/2020 11:52 AM

## 2020-01-06 NOTE — Patient Instructions (Signed)
[facility] Discharge Instructions for Patients Receiving Chemotherapy  Today you received the following chemotherapy agents: Paclitaxel, Carboplatin, Trastuzumab, Pertuzumab  To help prevent nausea and vomiting after your treatment, we encourage you to take your nausea medication as prescribed.  Do not take your zofran (ondansteron) for 3 days after treatment!   If you develop nausea and vomiting that is not controlled by your nausea medication, call the clinic.   BELOW ARE SYMPTOMS THAT SHOULD BE REPORTED IMMEDIATELY:  *FEVER GREATER THAN 100.5 F  *CHILLS WITH OR WITHOUT FEVER  NAUSEA AND VOMITING THAT IS NOT CONTROLLED WITH YOUR NAUSEA MEDICATION  *UNUSUAL SHORTNESS OF BREATH  *UNUSUAL BRUISING OR BLEEDING  TENDERNESS IN MOUTH AND THROAT WITH OR WITHOUT PRESENCE OF ULCERS  *URINARY PROBLEMS  *BOWEL PROBLEMS  UNUSUAL RASH Items with * indicate a potential emergency and should be followed up as soon as possible.  Feel free to call the clinic should you have any questions or concerns. The clinic phone number is [phone].  Please show the [place] at check-in to the Emergency Department and triage nurse.    Paclitaxel injection What is this medicine? PACLITAXEL (PAK li TAX el) is a chemotherapy drug. It targets fast dividing cells, like cancer cells, and causes these cells to die. This medicine is used to treat ovarian cancer, breast cancer, lung cancer, Kaposi's sarcoma, and other cancers. This medicine may be used for other purposes; ask your health care provider or pharmacist if you have questions. COMMON BRAND NAME(S): Onxol, Taxol What should I tell my health care provider before I take this medicine? They need to know if you have any of these conditions:  history of irregular heartbeat  liver disease  low blood counts, like low white cell, platelet, or red cell counts  lung or breathing disease, like asthma  tingling of the fingers  or toes, or other nerve disorder  an unusual or allergic reaction to paclitaxel, alcohol, polyoxyethylated castor oil, other chemotherapy, other medicines, foods, dyes, or preservatives  pregnant or trying to get pregnant  breast-feeding How should I use this medicine? This drug is given as an infusion into a vein. It is administered in a hospital or clinic by a specially trained health care professional. Talk to your pediatrician regarding the use of this medicine in children. Special care may be needed. Overdosage: If you think you have taken too much of this medicine contact a poison control center or emergency room at once. NOTE: This medicine is only for you. Do not share this medicine with others. What if I miss a dose? It is important not to miss your dose. Call your doctor or health care professional if you are unable to keep an appointment. What may interact with this medicine? Do not take this medicine with any of the following medications:  disulfiram  metronidazole This medicine may also interact with the following medications:  antiviral medicines for hepatitis, HIV or AIDS  certain antibiotics like erythromycin and clarithromycin  certain medicines for fungal infections like ketoconazole and itraconazole  certain medicines for seizures like carbamazepine, phenobarbital, phenytoin  gemfibrozil  nefazodone  rifampin  St. John's wort This list may not describe all possible interactions. Give your health care provider a list of all the medicines, herbs, non-prescription drugs, or dietary supplements you use. Also tell them if you smoke, drink alcohol, or use illegal drugs. Some items may interact with your medicine. What should I watch for while using this medicine? Your  condition will be monitored carefully while you are receiving this medicine. You will need important blood work done while you are taking this medicine. This medicine can cause serious allergic  reactions. To reduce your risk you will need to take other medicine(s) before treatment with this medicine. If you experience allergic reactions like skin rash, itching or hives, swelling of the face, lips, or tongue, tell your doctor or health care professional right away. In some cases, you may be given additional medicines to help with side effects. Follow all directions for their use. This drug may make you feel generally unwell. This is not uncommon, as chemotherapy can affect healthy cells as well as cancer cells. Report any side effects. Continue your course of treatment even though you feel ill unless your doctor tells you to stop. Call your doctor or health care professional for advice if you get a fever, chills or sore throat, or other symptoms of a cold or flu. Do not treat yourself. This drug decreases your body's ability to fight infections. Try to avoid being around people who are sick. This medicine may increase your risk to bruise or bleed. Call your doctor or health care professional if you notice any unusual bleeding. Be careful brushing and flossing your teeth or using a toothpick because you may get an infection or bleed more easily. If you have any dental work done, tell your dentist you are receiving this medicine. Avoid taking products that contain aspirin, acetaminophen, ibuprofen, naproxen, or ketoprofen unless instructed by your doctor. These medicines may hide a fever. Do not become pregnant while taking this medicine. Women should inform their doctor if they wish to become pregnant or think they might be pregnant. There is a potential for serious side effects to an unborn child. Talk to your health care professional or pharmacist for more information. Do not breast-feed an infant while taking this medicine. Men are advised not to father a child while receiving this medicine. This product may contain alcohol. Ask your pharmacist or healthcare provider if this medicine contains  alcohol. Be sure to tell all healthcare providers you are taking this medicine. Certain medicines, like metronidazole and disulfiram, can cause an unpleasant reaction when taken with alcohol. The reaction includes flushing, headache, nausea, vomiting, sweating, and increased thirst. The reaction can last from 30 minutes to several hours. What side effects may I notice from receiving this medicine? Side effects that you should report to your doctor or health care professional as soon as possible:  allergic reactions like skin rash, itching or hives, swelling of the face, lips, or tongue  breathing problems  changes in vision  fast, irregular heartbeat  high or low blood pressure  mouth sores  pain, tingling, numbness in the hands or feet  signs of decreased platelets or bleeding - bruising, pinpoint red spots on the skin, black, tarry stools, blood in the urine  signs of decreased red blood cells - unusually weak or tired, feeling faint or lightheaded, falls  signs of infection - fever or chills, cough, sore throat, pain or difficulty passing urine  signs and symptoms of liver injury like dark yellow or brown urine; general ill feeling or flu-like symptoms; light-colored stools; loss of appetite; nausea; right upper belly pain; unusually weak or tired; yellowing of the eyes or skin  swelling of the ankles, feet, hands  unusually slow heartbeat Side effects that usually do not require medical attention (report to your doctor or health care professional if they continue or  are bothersome):  diarrhea  hair loss  loss of appetite  muscle or joint pain  nausea, vomiting  pain, redness, or irritation at site where injected  tiredness This list may not describe all possible side effects. Call your doctor for medical advice about side effects. You may report side effects to FDA at 1-800-FDA-1088. Where should I keep my medicine? This drug is given in a hospital or clinic and will  not be stored at home. NOTE: This sheet is a summary. It may not cover all possible information. If you have questions about this medicine, talk to your doctor, pharmacist, or health care provider.  2020 Elsevier/Gold Standard (2016-12-04 13:14:55)    Carboplatin injection What is this medicine? CARBOPLATIN (KAR boe pla tin) is a chemotherapy drug. It targets fast dividing cells, like cancer cells, and causes these cells to die. This medicine is used to treat ovarian cancer and many other cancers. This medicine may be used for other purposes; ask your health care provider or pharmacist if you have questions. COMMON BRAND NAME(S): Paraplatin What should I tell my health care provider before I take this medicine? They need to know if you have any of these conditions:  blood disorders  hearing problems  kidney disease  recent or ongoing radiation therapy  an unusual or allergic reaction to carboplatin, cisplatin, other chemotherapy, other medicines, foods, dyes, or preservatives  pregnant or trying to get pregnant  breast-feeding How should I use this medicine? This drug is usually given as an infusion into a vein. It is administered in a hospital or clinic by a specially trained health care professional. Talk to your pediatrician regarding the use of this medicine in children. Special care may be needed. Overdosage: If you think you have taken too much of this medicine contact a poison control center or emergency room at once. NOTE: This medicine is only for you. Do not share this medicine with others. What if I miss a dose? It is important not to miss a dose. Call your doctor or health care professional if you are unable to keep an appointment. What may interact with this medicine?  medicines for seizures  medicines to increase blood counts like filgrastim, pegfilgrastim, sargramostim  some antibiotics like amikacin, gentamicin, neomycin, streptomycin,  tobramycin  vaccines Talk to your doctor or health care professional before taking any of these medicines:  acetaminophen  aspirin  ibuprofen  ketoprofen  naproxen This list may not describe all possible interactions. Give your health care provider a list of all the medicines, herbs, non-prescription drugs, or dietary supplements you use. Also tell them if you smoke, drink alcohol, or use illegal drugs. Some items may interact with your medicine. What should I watch for while using this medicine? Your condition will be monitored carefully while you are receiving this medicine. You will need important blood work done while you are taking this medicine. This drug may make you feel generally unwell. This is not uncommon, as chemotherapy can affect healthy cells as well as cancer cells. Report any side effects. Continue your course of treatment even though you feel ill unless your doctor tells you to stop. In some cases, you may be given additional medicines to help with side effects. Follow all directions for their use. Call your doctor or health care professional for advice if you get a fever, chills or sore throat, or other symptoms of a cold or flu. Do not treat yourself. This drug decreases your body's ability to fight infections.  Try to avoid being around people who are sick. This medicine may increase your risk to bruise or bleed. Call your doctor or health care professional if you notice any unusual bleeding. Be careful brushing and flossing your teeth or using a toothpick because you may get an infection or bleed more easily. If you have any dental work done, tell your dentist you are receiving this medicine. Avoid taking products that contain aspirin, acetaminophen, ibuprofen, naproxen, or ketoprofen unless instructed by your doctor. These medicines may hide a fever. Do not become pregnant while taking this medicine. Women should inform their doctor if they wish to become pregnant or  think they might be pregnant. There is a potential for serious side effects to an unborn child. Talk to your health care professional or pharmacist for more information. Do not breast-feed an infant while taking this medicine. What side effects may I notice from receiving this medicine? Side effects that you should report to your doctor or health care professional as soon as possible:  allergic reactions like skin rash, itching or hives, swelling of the face, lips, or tongue  signs of infection - fever or chills, cough, sore throat, pain or difficulty passing urine  signs of decreased platelets or bleeding - bruising, pinpoint red spots on the skin, black, tarry stools, nosebleeds  signs of decreased red blood cells - unusually weak or tired, fainting spells, lightheadedness  breathing problems  changes in hearing  changes in vision  chest pain  high blood pressure  low blood counts - This drug may decrease the number of white blood cells, red blood cells and platelets. You may be at increased risk for infections and bleeding.  nausea and vomiting  pain, swelling, redness or irritation at the injection site  pain, tingling, numbness in the hands or feet  problems with balance, talking, walking  trouble passing urine or change in the amount of urine Side effects that usually do not require medical attention (report to your doctor or health care professional if they continue or are bothersome):  hair loss  loss of appetite  metallic taste in the mouth or changes in taste This list may not describe all possible side effects. Call your doctor for medical advice about side effects. You may report side effects to FDA at 1-800-FDA-1088. Where should I keep my medicine? This drug is given in a hospital or clinic and will not be stored at home. NOTE: This sheet is a summary. It may not cover all possible information. If you have questions about this medicine, talk to your doctor,  pharmacist, or health care provider.  2020 Elsevier/Gold Standard (2007-07-08 14:38:05)   Trastuzumab injection for infusion What is this medicine? TRASTUZUMAB (tras TOO zoo mab) is a monoclonal antibody. It is used to treat breast cancer and stomach cancer. This medicine may be used for other purposes; ask your health care provider or pharmacist if you have questions. COMMON BRAND NAME(S): Herceptin, Galvin Proffer, Trazimera What should I tell my health care provider before I take this medicine? They need to know if you have any of these conditions:  heart disease  heart failure  lung or breathing disease, like asthma  an unusual or allergic reaction to trastuzumab, benzyl alcohol, or other medications, foods, dyes, or preservatives  pregnant or trying to get pregnant  breast-feeding How should I use this medicine? This drug is given as an infusion into a vein. It is administered in a hospital or clinic by a  specially trained health care professional. Talk to your pediatrician regarding the use of this medicine in children. This medicine is not approved for use in children. Overdosage: If you think you have taken too much of this medicine contact a poison control center or emergency room at once. NOTE: This medicine is only for you. Do not share this medicine with others. What if I miss a dose? It is important not to miss a dose. Call your doctor or health care professional if you are unable to keep an appointment. What may interact with this medicine? This medicine may interact with the following medications:  certain types of chemotherapy, such as daunorubicin, doxorubicin, epirubicin, and idarubicin This list may not describe all possible interactions. Give your health care provider a list of all the medicines, herbs, non-prescription drugs, or dietary supplements you use. Also tell them if you smoke, drink alcohol, or use illegal drugs. Some items may  interact with your medicine. What should I watch for while using this medicine? Visit your doctor for checks on your progress. Report any side effects. Continue your course of treatment even though you feel ill unless your doctor tells you to stop. Call your doctor or health care professional for advice if you get a fever, chills or sore throat, or other symptoms of a cold or flu. Do not treat yourself. Try to avoid being around people who are sick. You may experience fever, chills and shaking during your first infusion. These effects are usually mild and can be treated with other medicines. Report any side effects during the infusion to your health care professional. Fever and chills usually do not happen with later infusions. Do not become pregnant while taking this medicine or for 7 months after stopping it. Women should inform their doctor if they wish to become pregnant or think they might be pregnant. Women of child-bearing potential will need to have a negative pregnancy test before starting this medicine. There is a potential for serious side effects to an unborn child. Talk to your health care professional or pharmacist for more information. Do not breast-feed an infant while taking this medicine or for 7 months after stopping it. Women must use effective birth control with this medicine. What side effects may I notice from receiving this medicine? Side effects that you should report to your doctor or health care professional as soon as possible:  allergic reactions like skin rash, itching or hives, swelling of the face, lips, or tongue  chest pain or palpitations  cough  dizziness  feeling faint or lightheaded, falls  fever  general ill feeling or flu-like symptoms  signs of worsening heart failure like breathing problems; swelling in your legs and feet  unusually weak or tired Side effects that usually do not require medical attention (report to your doctor or health care  professional if they continue or are bothersome):  bone pain  changes in taste  diarrhea  joint pain  nausea/vomiting  weight loss This list may not describe all possible side effects. Call your doctor for medical advice about side effects. You may report side effects to FDA at 1-800-FDA-1088. Where should I keep my medicine? This drug is given in a hospital or clinic and will not be stored at home. NOTE: This sheet is a summary. It may not cover all possible information. If you have questions about this medicine, talk to your doctor, pharmacist, or health care provider.  2020 Elsevier/Gold Standard (2016-03-27 14:37:52)    Pertuzumab injection What  is this medicine? PERTUZUMAB (per TOOZ ue mab) is a monoclonal antibody. It is used to treat breast cancer. This medicine may be used for other purposes; ask your health care provider or pharmacist if you have questions. COMMON BRAND NAME(S): PERJETA What should I tell my health care provider before I take this medicine? They need to know if you have any of these conditions:  heart disease  heart failure  high blood pressure  history of irregular heart beat  recent or ongoing radiation therapy  an unusual or allergic reaction to pertuzumab, other medicines, foods, dyes, or preservatives  pregnant or trying to get pregnant  breast-feeding How should I use this medicine? This medicine is for infusion into a vein. It is given by a health care professional in a hospital or clinic setting. Talk to your pediatrician regarding the use of this medicine in children. Special care may be needed. Overdosage: If you think you have taken too much of this medicine contact a poison control center or emergency room at once. NOTE: This medicine is only for you. Do not share this medicine with others. What if I miss a dose? It is important not to miss your dose. Call your doctor or health care professional if you are unable to keep an  appointment. What may interact with this medicine? Interactions are not expected. Give your health care provider a list of all the medicines, herbs, non-prescription drugs, or dietary supplements you use. Also tell them if you smoke, drink alcohol, or use illegal drugs. Some items may interact with your medicine. This list may not describe all possible interactions. Give your health care provider a list of all the medicines, herbs, non-prescription drugs, or dietary supplements you use. Also tell them if you smoke, drink alcohol, or use illegal drugs. Some items may interact with your medicine. What should I watch for while using this medicine? Your condition will be monitored carefully while you are receiving this medicine. Report any side effects. Continue your course of treatment even though you feel ill unless your doctor tells you to stop. Do not become pregnant while taking this medicine or for 7 months after stopping it. Women should inform their doctor if they wish to become pregnant or think they might be pregnant. Women of child-bearing potential will need to have a negative pregnancy test before starting this medicine. There is a potential for serious side effects to an unborn child. Talk to your health care professional or pharmacist for more information. Do not breast-feed an infant while taking this medicine or for 7 months after stopping it. Women must use effective birth control with this medicine. Call your doctor or health care professional for advice if you get a fever, chills or sore throat, or other symptoms of a cold or flu. Do not treat yourself. Try to avoid being around people who are sick. You may experience fever, chills, and headache during the infusion. Report any side effects during the infusion to your health care professional. What side effects may I notice from receiving this medicine? Side effects that you should report to your doctor or health care professional as soon  as possible:  breathing problems  chest pain or palpitations  dizziness  feeling faint or lightheaded  fever or chills  skin rash, itching or hives  sore throat  swelling of the face, lips, or tongue  swelling of the legs or ankles  unusually weak or tired Side effects that usually do not require medical attention (  report to your doctor or health care professional if they continue or are bothersome):  diarrhea  hair loss  nausea, vomiting  tiredness This list may not describe all possible side effects. Call your doctor for medical advice about side effects. You may report side effects to FDA at 1-800-FDA-1088. Where should I keep my medicine? This drug is given in a hospital or clinic and will not be stored at home. NOTE: This sheet is a summary. It may not cover all possible information. If you have questions about this medicine, talk to your doctor, pharmacist, or health care provider.  2020 Elsevier/Gold Standard (2015-05-05 12:08:50)

## 2020-01-06 NOTE — Assessment & Plan Note (Signed)
12/09/2019:Screening mammogram  showed a right breast asymmetry. Diagnostic mammogram showed a 2.9cm mass at the 9 o'clock position with 2 adjacent masses at the 10 and 8 o'clock positions, 0.9cm and 1.4cm respectively, and an enlarged lymph node, 1.3cm.  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 Neoadjuvant chemotherapy with TCH Perjeta 6 cycles followed by Herceptin and Perjeta versus Kadcyla maintenance for 1 year (also could be randomized compass HER 2 trial) 2. Followed by breast conserving surgery if possible with sentinel lymph node study 3. Followed by adjuvant radiation therapy if patient had lumpectomy 4.  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

## 2020-01-07 ENCOUNTER — Telehealth: Payer: Self-pay | Admitting: *Deleted

## 2020-01-08 ENCOUNTER — Telehealth: Payer: Self-pay | Admitting: Hematology and Oncology

## 2020-01-08 ENCOUNTER — Other Ambulatory Visit: Payer: Self-pay

## 2020-01-08 ENCOUNTER — Encounter: Payer: Self-pay | Admitting: Hematology and Oncology

## 2020-01-08 ENCOUNTER — Inpatient Hospital Stay: Payer: BC Managed Care – PPO

## 2020-01-08 VITALS — BP 116/63 | HR 49 | Temp 98.6°F | Resp 18

## 2020-01-08 DIAGNOSIS — Z170 Estrogen receptor positive status [ER+]: Secondary | ICD-10-CM

## 2020-01-08 DIAGNOSIS — C50411 Malignant neoplasm of upper-outer quadrant of right female breast: Secondary | ICD-10-CM

## 2020-01-08 MED ORDER — PEGFILGRASTIM-CBQV 6 MG/0.6ML ~~LOC~~ SOSY
PREFILLED_SYRINGE | SUBCUTANEOUS | Status: AC
  Filled 2020-01-08: qty 0.6

## 2020-01-08 MED ORDER — PEGFILGRASTIM-CBQV 6 MG/0.6ML ~~LOC~~ SOSY
6.0000 mg | PREFILLED_SYRINGE | Freq: Once | SUBCUTANEOUS | Status: AC
  Administered 2020-01-08: 6 mg via SUBCUTANEOUS

## 2020-01-08 NOTE — Progress Notes (Signed)
Met w/ pt to introduce myself as herFinancial Resource Specialist andtodiscuss copay assistance.  Pt gave me consent to apply in herbehalf so I completed theonlineapplicationwith Administrator for [employer].  The app is pending so I will notify herof the outcome once I receive it.  I enrolledher in the[employer] program. She wasapproved for $25,000 for Perjeta for12 monthsfrom9/24/21. Pt's responsibility will be as little as $5 per infusion.  Ialso enrollher in the [place] Completeprogramfor Udenycafor $15,000 for 12 monthsfrom9/24/21.  Pt is eligible to have $0 out-of-pocket costs for each Udenyca.  Pt is overqualified for the [employer].  I gaveher my card for any questions or concernsshe may have in the future.

## 2020-01-08 NOTE — Patient Instructions (Signed)

## 2020-01-08 NOTE — Telephone Encounter (Signed)
Scheduled per 9/22 los. Pt will receive and updated appt calendar, per appt notes

## 2020-01-11 ENCOUNTER — Encounter: Payer: Self-pay | Admitting: Radiology

## 2020-01-11 DIAGNOSIS — C50411 Malignant neoplasm of upper-outer quadrant of right female breast: Secondary | ICD-10-CM

## 2020-01-11 DIAGNOSIS — Z170 Estrogen receptor positive status [ER+]: Secondary | ICD-10-CM

## 2020-01-11 NOTE — Research (Signed)
DCP-001, Use of a Clinical Trial Screening Tool to Address Cancer Health Disparities in the [place] Program  [facility])  REGISTRATION: Completed DCP CRF based on patients responses and medical record. Enrolled patient into OPEN and patient was given ID [id number]. Printed out confirmation and full form to give to [place].   Carol Ada, RT(R)(T) Clinical Research Coordinator

## 2020-01-12 NOTE — Progress Notes (Signed)
Patient Care Team: Katherina Mires, MD as PCP - General (Family Medicine) Rockwell Germany, RN as Oncology Nurse Navigator Mauro Kaufmann, RN as Oncology Nurse Navigator Erroll Luna, MD as Consulting Physician (General Surgery) Nicholas Lose, MD as Consulting Physician (Hematology and Oncology) Gery Pray, MD as Consulting Physician (Radiation Oncology)  DIAGNOSIS:    ICD-10-CM   1. Malignant neoplasm of upper-outer quadrant of right breast in female, estrogen receptor positive ([place])  C50.411    Z17.0     SUMMARY OF ONCOLOGIC HISTORY: Oncology History  Malignant neoplasm of upper-outer quadrant of right breast in female, estrogen receptor positive ([place])  12/16/2019 Initial Diagnosis   Screening mammogram  showed a right breast asymmetry. Diagnostic mammogram showed a 2.9cm mass at the 9 o'clock position with 2 adjacent masses at the 10 and 8 o'clock positions, 0.9cm and 1.4cm respectively, and an enlarged lymph node, 1.3cm.  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 The report date is 12/31/2019.   The Multi-Cancer Panel offered by [facility] includes sequencing and/or deletion duplication testing of the following 85 genes: AIP, ALK, APC, ATM, AXIN2,BAP1,  BARD1, BLM, BMPR1A, BRCA1, BRCA2, BRIP1, CASR, CDC73, CDH1, CDK4, CDKN1B, CDKN1C, CDKN2A (p14ARF), CDKN2A (p16INK4a), CEBPA, CHEK2, CTNNA1, DICER1, DIS3L2, EGFR (c.2369C>T, p.Thr790Met variant only), EPCAM (Deletion/duplication testing only), FH, FLCN, GATA2, GPC3, GREM1 (Promoter region deletion/duplication testing only), HOXB13 (c.251G>A, p.Gly84Glu), HRAS, KIT, MAX, MEN1, MET, MITF (c.952G>A, p.Glu318Lys variant only), MLH1, MSH2, MSH3, MSH6, MUTYH, NBN, NF1,  NF2, NTHL1, PALB2, PDGFRA, PHOX2B, PMS2, POLD1, POLE, POT1, PRKAR1A, PTCH1, PTEN, RAD50, RAD51C, RAD51D, RB1, RECQL4, RET, RNF43, RUNX1, SDHAF2, SDHA (sequence changes only), SDHB, SDHC, SDHD, SMAD4, SMARCA4, SMARCB1, SMARCE1, STK11, SUFU, TERC, TERT, TMEM127, TP53, TSC1, TSC2, VHL, WRN and WT1.   01/06/2020 -  Chemotherapy   The patient had dexamethasone (DECADRON) 4 MG tablet, 4 mg (100 % of original dose 4 mg), Oral, Daily, 1 of 1 cycle, Start date: 12/23/2019, End date: -- Dose modification: 4 mg (original dose 4 mg, Cycle 0) palonosetron (ALOXI) injection 0.25 mg, 0.25 mg, Intravenous,  Once, 1 of 6 cycles Administration: 0.25 mg (01/06/2020) pegfilgrastim-cbqv (UDENYCA) injection 6 mg, 6 mg, Subcutaneous, Once, 1 of 6 cycles Administration: 6 mg (01/08/2020) CARBOplatin (PARAPLATIN) 540 mg in sodium chloride 0.9 % 250 mL chemo infusion, 540 mg (100 % of original dose 541 mg), Intravenous,  Once, 1 of 6 cycles Dose modification:   (original dose 541 mg, Cycle 1) Administration: 540 mg (01/06/2020) DOCEtaxel (TAXOTERE) 130 mg in sodium chloride 0.9 % 250 mL chemo infusion, 75 mg/m2 = 130 mg, Intravenous,  Once, 1 of 6 cycles Administration: 130 mg (01/06/2020) fosaprepitant (EMEND) 150 mg in sodium chloride 0.9 % 145 mL IVPB, 150 mg, Intravenous,  Once, 1 of 6 cycles Administration: 150 mg (01/06/2020) pertuzumab (PERJETA) 420 mg in sodium chloride 0.9 % 250 mL chemo infusion, 420 mg (100 % of original dose 420 mg), Intravenous, Once, 1 of 6 cycles Dose modification: 420 mg (original dose 420 mg, Cycle 1, Reason: Provider Judgment) Administration: 420 mg (01/06/2020) trastuzumab-dkst (OGIVRI) 525 mg in sodium chloride 0.9 % 250 mL chemo infusion, 8 mg/kg = 525 mg, Intravenous,  Once, 1 of 6 cycles Administration: 525 mg (01/06/2020)  for chemotherapy treatment.      CHIEF COMPLIANT: Cycle 1 Day 8 TCH Perjeta  INTERVAL HISTORY: Michelle Anthony is a 54 y.o. with above-mentioned history of right  breast cancer currently on neoadjuvant chemotherapy with [place]. She presents to the clinic today for a toxicity check following cycle 1.  Overall she tolerated chemotherapy fairly well.  She did have nausea issues but she took antiemetics which helped her significantly.  She threw up once.  She had one episode of diarrhea.  ALLERGIES:  has No Known Allergies.  MEDICATIONS:  Current Outpatient Medications  Medication Sig Dispense Refill  . COLLAGEN PO Apply topically daily.    [name] dexamethasone (DECADRON) 4 MG tablet Take 1 tablet (4 mg total) by mouth daily. Take 1 tablet day before chemo and 1 tablet 1 day after chemo with food 12 tablet 0  . diclofenac (VOLTAREN) 50 MG EC tablet Take 50 mg by mouth 2 (two) times daily as needed.    [name] HYDROcodone-acetaminophen (NORCO/VICODIN) 5-325 MG tablet Take 1 tablet by mouth every 6 (six) hours as needed for moderate pain. 15 tablet 0  . ibuprofen (ADVIL) 800 MG tablet Take 1 tablet (800 mg total) by mouth every 8 (eight) hours as needed. 30 tablet 0  . lidocaine-prilocaine (EMLA) cream Apply to affected area once 30 g 3  . LORazepam (ATIVAN) 0.5 MG tablet Take 1 tablet (0.5 mg total) by mouth every 6 (six) hours as needed for sleep. 30 tablet 0  . Multiple Vitamin (MULTIVITAMIN) tablet Take 1 tablet by mouth daily.    . ondansetron (ZOFRAN) 8 MG tablet Take 1 tablet (8 mg total) by mouth 2 (two) times daily as needed (Nausea or vomiting). Start on the third day after chemotherapy. 30 tablet 1  . prochlorperazine (COMPAZINE) 10 MG tablet Take 1 tablet (10 mg total) by mouth every 6 (six) hours as needed (Nausea or vomiting). 30 tablet 1  . Tretinoin (RETIN-A EX) Apply topically.     No current facility-administered medications for this visit.    PHYSICAL EXAMINATION: ECOG PERFORMANCE STATUS: 1 - Symptomatic but completely ambulatory  Vitals:   01/13/20 1140  BP: 126/82  Pulse: 86  Resp: 18  Temp: 97.8 F (36.6 C)  SpO2: 99%   Filed  Weights   01/13/20 1140  Weight: 142 lb 14.4 oz (64.8 kg)    LABORATORY DATA:  I have reviewed the data as listed CMP Latest Ref Rng & Units 01/13/2020 01/06/2020 01/01/2020  Glucose 70 - 99 mg/dL 118(H) 110(H) 92  BUN 6 - 20 mg/dL $Remove'10 18 15  'YMYRZqM$ Creatinine 0.44 - 1.00 mg/dL 0.72 0.76 0.73  Sodium 135 - 145 mmol/L 138 140 138  Potassium 3.5 - 5.1 mmol/L 3.8 3.9 4.1  Chloride 98 - 111 mmol/L 104 107 100  CO2 22 - 32 mmol/L $RemoveB'27 26 27  'aGxXOMVs$ Calcium 8.9 - 10.3 mg/dL 9.4 9.5 9.8  Total Protein 6.5 - 8.1 g/dL 6.9 6.9 7.3  Total Bilirubin 0.3 - 1.2 mg/dL 0.3 0.4 0.6  Alkaline Phos 38 - 126 U/L 77 58 58  AST 15 - 41 U/L 16 13(L) 17  ALT 0 - 44 U/L $Remo'24 10 14    'LGpmD$ Lab Results  Component Value Date   WBC 8.0 01/13/2020   HGB 12.9 01/13/2020   HCT 37.7 01/13/2020   MCV 88.5 01/13/2020   PLT 124 (L) 01/13/2020   NEUTROABS 3.6 01/13/2020    ASSESSMENT & PLAN:  Malignant neoplasm of upper-outer quadrant of right  breast in female, estrogen receptor positive ([place]) 12/09/2019:Screening mammogram showed a right breast asymmetry. Diagnostic mammogram showed a 2.9cm mass at the 9 o'clock position with 2 adjacent masses at the 10 and 8 o'clock positions, 0.9cm and 1.4cm respectively, and an enlarged lymph node, 1.3cm. 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 Neoadjuvant chemotherapy with TCH Perjeta 6 cycles followed by Herceptinand Perjeta versus Kadcylamaintenance for 1 year(also could be randomized compass HER 2 trial) 2. Followed by breast conserving surgery if possible with sentinel lymph node study 3. Followed by adjuvant radiation therapy if patient had lumpectomy 4.Followed by adjuvant antiestrogen therapy SWOG S 1714 neuropathy clinical trial: No adverse effects from participating in the trial 12/31/2019: Breast MRI: Known malignancy 3.1 cm, with  non-mass enhancement total measuring 4.2 cm.  Additional masses 6 mm and 3 and 4 mm --------------------------------------------------------------------------------------------------------------------------------------------- Current treatment: Cycle 1 day 8 TCH Perjeta  Echocardiogram 12/30/2019: EF 50 to 55%  Chemo toxicities: 1.  Nausea vomiting: Mild to moderate: She took antiemetics which have helped her significantly.  She threw up once. 2. bone pain due to Neulasta: This is in spite of taking ibuprofen.  Encourage her to take Tylenol next time. 3.  One episode of diarrhea. Reviewed the blood counts and she is not neutropenic.  Therefore we will keep the treatment dose the same for the next cycle.  MRI guided biopsy will need to be performed on the 2 additional masses. Return to clinic in 2 weeks for cycle 2    No orders of the defined types were placed in this encounter.  The patient has a good understanding of the overall plan. she agrees with it. she will call with any problems that may develop before the next visit here.  Total time spent: 30 mins including face to face time and time spent for planning, charting and coordination of care  Nicholas Lose, MD 01/13/2020  I, Cloyde Reams Dorshimer, am acting as scribe for Dr. Nicholas Lose.  I have reviewed the above documentation for accuracy and completeness, and I agree with the above.

## 2020-01-13 ENCOUNTER — Inpatient Hospital Stay: Payer: BC Managed Care – PPO | Admitting: Hematology and Oncology

## 2020-01-13 ENCOUNTER — Encounter: Payer: Self-pay | Admitting: *Deleted

## 2020-01-13 ENCOUNTER — Inpatient Hospital Stay: Payer: BC Managed Care – PPO

## 2020-01-13 ENCOUNTER — Other Ambulatory Visit: Payer: Self-pay

## 2020-01-13 DIAGNOSIS — C50411 Malignant neoplasm of upper-outer quadrant of right female breast: Secondary | ICD-10-CM | POA: Diagnosis not present

## 2020-01-13 DIAGNOSIS — Z170 Estrogen receptor positive status [ER+]: Secondary | ICD-10-CM

## 2020-01-13 DIAGNOSIS — Z95828 Presence of other vascular implants and grafts: Secondary | ICD-10-CM

## 2020-01-13 LAB — CMP (CANCER CENTER ONLY)
ALT: 24 U/L (ref 0–44)
AST: 16 U/L (ref 15–41)
Albumin: 3.7 g/dL (ref 3.5–5.0)
Alkaline Phosphatase: 77 U/L (ref 38–126)
Anion gap: 7 (ref 5–15)
BUN: 10 mg/dL (ref 6–20)
CO2: 27 mmol/L (ref 22–32)
Calcium: 9.4 mg/dL (ref 8.9–10.3)
Chloride: 104 mmol/L (ref 98–111)
Creatinine: 0.72 mg/dL (ref 0.44–1.00)
GFR, Est AFR Am: >60 mL/min (ref >60)
GFR, Estimated: >60 mL/min (ref >60)
Glucose, Bld: 118 mg/dL — ABNORMAL HIGH (ref 70–99)
Potassium: 3.8 mmol/L (ref 3.5–5.1)
Sodium: 138 mmol/L (ref 135–145)
Total Bilirubin: 0.3 mg/dL (ref 0.3–1.2)
Total Protein: 6.9 g/dL (ref 6.5–8.1)

## 2020-01-13 LAB — CBC WITH DIFFERENTIAL (CANCER CENTER ONLY)
Abs Immature Granulocytes: 0.54 10*3/uL — ABNORMAL HIGH (ref 0.00–0.07)
Basophils Absolute: 0 10*3/uL (ref 0.0–0.1)
Basophils Relative: 0 %
Eosinophils Absolute: 0 10*3/uL (ref 0.0–0.5)
Eosinophils Relative: 0 %
HCT: 37.7 % (ref 36.0–46.0)
Hemoglobin: 12.9 g/dL (ref 12.0–15.0)
Immature Granulocytes: 7 %
Lymphocytes Relative: 34 %
Lymphs Abs: 2.8 10*3/uL (ref 0.7–4.0)
MCH: 30.3 pg (ref 26.0–34.0)
MCHC: 34.2 g/dL (ref 30.0–36.0)
MCV: 88.5 fL (ref 80.0–100.0)
Monocytes Absolute: 1.1 10*3/uL — ABNORMAL HIGH (ref 0.1–1.0)
Monocytes Relative: 13 %
Neutro Abs: 3.6 10*3/uL (ref 1.7–7.7)
Neutrophils Relative %: 46 %
Platelet Count: 124 10*3/uL — ABNORMAL LOW (ref 150–400)
RBC: 4.26 MIL/uL (ref 3.87–5.11)
RDW: 12.2 % (ref 11.5–15.5)
WBC Count: 8 10*3/uL (ref 4.0–10.5)
nRBC: 0 % (ref 0.0–0.2)

## 2020-01-13 MED ORDER — SODIUM CHLORIDE 0.9% FLUSH
10.0000 mL | Freq: Once | INTRAVENOUS | Status: AC
  Administered 2020-01-13: 10 mL
  Filled 2020-01-13: qty 10

## 2020-01-13 MED ORDER — HEPARIN SOD (PORK) LOCK FLUSH 100 UNIT/ML IV SOLN
500.0000 [IU] | Freq: Once | INTRAVENOUS | Status: AC
  Administered 2020-01-13: 500 [IU]
  Filled 2020-01-13: qty 5

## 2020-01-13 NOTE — Assessment & Plan Note (Signed)
12/09/2019:Screening mammogram showed a right breast asymmetry. Diagnostic mammogram showed a 2.9cm mass at the 9 o'clock position with 2 adjacent masses at the 10 and 8 o'clock positions, 0.9cm and 1.4cm respectively, and an enlarged lymph node, 1.3cm. 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 Neoadjuvant chemotherapy with TCH Perjeta 6 cycles followed by Herceptinand Perjeta versus Kadcylamaintenance for 1 year(also could be randomized compass HER 2 trial) 2. Followed by breast conserving surgery if possible with sentinel lymph node study 3. Followed by adjuvant radiation therapy if patient had lumpectomy 4.Followed by adjuvant antiestrogen therapy SWOG S 1714 neuropathy clinical trial: No adverse effects from participating in the trial 12/31/2019: Breast MRI: Known malignancy 3.1 cm, with non-mass enhancement total measuring 4.2 cm.  Additional masses 6 mm and 3 and 4 mm --------------------------------------------------------------------------------------------------------------------------------------------- Current treatment: Cycle 1 day 8 TCH Perjeta  Echocardiogram 12/30/2019: EF 50 to 55%  Chemo toxicities:  MRI guided biopsy will need to be performed on the 2 additional masses. Return to clinic in 2 weeks for cycle 2

## 2020-01-14 ENCOUNTER — Telehealth: Payer: Self-pay | Admitting: Hematology and Oncology

## 2020-01-14 NOTE — Telephone Encounter (Signed)
No 9/29 los, no changes made to pt schedule

## 2020-01-15 ENCOUNTER — Other Ambulatory Visit: Payer: Self-pay | Admitting: *Deleted

## 2020-01-15 DIAGNOSIS — Z170 Estrogen receptor positive status [ER+]: Secondary | ICD-10-CM

## 2020-01-15 DIAGNOSIS — C50411 Malignant neoplasm of upper-outer quadrant of right female breast: Secondary | ICD-10-CM

## 2020-01-18 ENCOUNTER — Ambulatory Visit
Admission: RE | Admit: 2020-01-18 | Discharge: 2020-01-18 | Disposition: A | Discharge status: Home / Self Care | Payer: BC Managed Care – PPO | Source: Ambulatory Visit | Attending: Hematology and Oncology | Admitting: Hematology and Oncology

## 2020-01-18 ENCOUNTER — Other Ambulatory Visit: Payer: Self-pay | Admitting: Diagnostic Radiology

## 2020-01-18 ENCOUNTER — Other Ambulatory Visit: Payer: Self-pay

## 2020-01-18 DIAGNOSIS — Z170 Estrogen receptor positive status [ER+]: Secondary | ICD-10-CM

## 2020-01-18 DIAGNOSIS — C50411 Malignant neoplasm of upper-outer quadrant of right female breast: Secondary | ICD-10-CM

## 2020-01-18 MED ORDER — GADOBUTROL 1 MMOL/ML IV SOLN
6.0000 mL | Freq: Once | INTRAVENOUS | Status: AC | PRN
  Administered 2020-01-18: 6 mL via INTRAVENOUS

## 2020-01-19 ENCOUNTER — Encounter: Payer: Self-pay | Admitting: *Deleted

## 2020-01-26 NOTE — Progress Notes (Signed)
Patient Care Team: Katherina Mires, MD as PCP - General (Family Medicine) Rockwell Germany, RN as Oncology Nurse Navigator Mauro Kaufmann, RN as Oncology Nurse Navigator Erroll Luna, MD as Consulting Physician (General Surgery) Nicholas Lose, MD as Consulting Physician (Hematology and Oncology) Gery Pray, MD as Consulting Physician (Radiation Oncology)  DIAGNOSIS:    ICD-10-CM   1. Malignant neoplasm of upper-outer quadrant of right breast in female, estrogen receptor positive ([place])  C50.411    Z17.0     SUMMARY OF ONCOLOGIC HISTORY: Oncology History  Malignant neoplasm of upper-outer quadrant of right breast in female, estrogen receptor positive ([place])  12/16/2019 Initial Diagnosis   Screening mammogram  showed a right breast asymmetry. Diagnostic mammogram showed a 2.9cm mass at the 9 o'clock position with 2 adjacent masses at the 10 and 8 o'clock positions, 0.9cm and 1.4cm respectively, and an enlarged lymph node, 1.3cm.  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 The report date is 12/31/2019.   The Multi-Cancer Panel offered by [facility] includes sequencing and/or deletion duplication testing of the following 85 genes: AIP, ALK, APC, ATM, AXIN2,BAP1,  BARD1, BLM, BMPR1A, BRCA1, BRCA2, BRIP1, CASR, CDC73, CDH1, CDK4, CDKN1B, CDKN1C, CDKN2A (p14ARF), CDKN2A (p16INK4a), CEBPA, CHEK2, CTNNA1, DICER1, DIS3L2, EGFR (c.2369C>T, p.Thr790Met variant only), EPCAM (Deletion/duplication testing only), FH, FLCN, GATA2, GPC3, GREM1 (Promoter region deletion/duplication testing only), HOXB13 (c.251G>A, p.Gly84Glu), HRAS, KIT, MAX, MEN1, MET, MITF (c.952G>A, p.Glu318Lys variant only), MLH1, MSH2, MSH3, MSH6, MUTYH, NBN, NF1,  NF2, NTHL1, PALB2, PDGFRA, PHOX2B, PMS2, POLD1, POLE, POT1, PRKAR1A, PTCH1, PTEN, RAD50, RAD51C, RAD51D, RB1, RECQL4, RET, RNF43, RUNX1, SDHAF2, SDHA (sequence changes only), SDHB, SDHC, SDHD, SMAD4, SMARCA4, SMARCB1, SMARCE1, STK11, SUFU, TERC, TERT, TMEM127, TP53, TSC1, TSC2, VHL, WRN and WT1.   01/06/2020 -  Chemotherapy   The patient had dexamethasone (DECADRON) 4 MG tablet, 4 mg (100 % of original dose 4 mg), Oral, Daily, 1 of 1 cycle, Start date: 12/23/2019, End date: -- Dose modification: 4 mg (original dose 4 mg, Cycle 0) palonosetron (ALOXI) injection 0.25 mg, 0.25 mg, Intravenous,  Once, 1 of 6 cycles Administration: 0.25 mg (01/06/2020) pegfilgrastim-cbqv (UDENYCA) injection 6 mg, 6 mg, Subcutaneous, Once, 1 of 6 cycles Administration: 6 mg (01/08/2020) CARBOplatin (PARAPLATIN) 540 mg in sodium chloride 0.9 % 250 mL chemo infusion, 540 mg (100 % of original dose 541 mg), Intravenous,  Once, 1 of 6 cycles Dose modification:   (original dose 541 mg, Cycle 1) Administration: 540 mg (01/06/2020) DOCEtaxel (TAXOTERE) 130 mg in sodium chloride 0.9 % 250 mL chemo infusion, 75 mg/m2 = 130 mg, Intravenous,  Once, 1 of 6 cycles Administration: 130 mg (01/06/2020) fosaprepitant (EMEND) 150 mg in sodium chloride 0.9 % 145 mL IVPB, 150 mg, Intravenous,  Once, 1 of 6 cycles Administration: 150 mg (01/06/2020) pertuzumab (PERJETA) 420 mg in sodium chloride 0.9 % 250 mL chemo infusion, 420 mg (100 % of original dose 420 mg), Intravenous, Once, 1 of 6 cycles Dose modification: 420 mg (original dose 420 mg, Cycle 1, Reason: Provider Judgment) Administration: 420 mg (01/06/2020) trastuzumab-dkst (OGIVRI) 525 mg in sodium chloride 0.9 % 250 mL chemo infusion, 8 mg/kg = 525 mg, Intravenous,  Once, 1 of 6 cycles Administration: 525 mg (01/06/2020)  for chemotherapy treatment.      CHIEF COMPLIANT: Cycle 2 TCH Perjeta  INTERVAL HISTORY: Michelle Anthony is a 54 y.o. with above-mentioned history of right breast  cancer currently on neoadjuvant chemotherapy with [place]. Right breast biopsy of the upper outer region showed invasive and in situ lobular carcinoma, and of the retroareolar regions showed LCIS, grade 2. She presents to the clinic todayfor a toxicity check and cycle 2.  ALLERGIES:  has No Known Allergies.  MEDICATIONS:  Current Outpatient Medications  Medication Sig Dispense Refill  . COLLAGEN PO Apply topically daily.    [name] dexamethasone (DECADRON) 4 MG tablet Take 1 tablet (4 mg total) by mouth daily. Take 1 tablet day before chemo and 1 tablet 1 day after chemo with food 12 tablet 0  . diclofenac (VOLTAREN) 50 MG EC tablet Take 50 mg by mouth 2 (two) times daily as needed.    [name] HYDROcodone-acetaminophen (NORCO/VICODIN) 5-325 MG tablet Take 1 tablet by mouth every 6 (six) hours as needed for moderate pain. 15 tablet 0  . ibuprofen (ADVIL) 800 MG tablet Take 1 tablet (800 mg total) by mouth every 8 (eight) hours as needed. 30 tablet 0  . lidocaine-prilocaine (EMLA) cream Apply to affected area once 30 g 3  . LORazepam (ATIVAN) 0.5 MG tablet Take 1 tablet (0.5 mg total) by mouth every 6 (six) hours as needed for sleep. 30 tablet 0  . Multiple Vitamin (MULTIVITAMIN) tablet Take 1 tablet by mouth daily.    . ondansetron (ZOFRAN) 8 MG tablet Take 1 tablet (8 mg total) by mouth 2 (two) times daily as needed (Nausea or vomiting). Start on the third day after chemotherapy. 30 tablet 1  . prochlorperazine (COMPAZINE) 10 MG tablet Take 1 tablet (10 mg total) by mouth every 6 (six) hours as needed (Nausea or vomiting). 30 tablet 1  . Tretinoin (RETIN-A EX) Apply topically.     No current facility-administered medications for this visit.   Facility-Administered Medications Ordered in Other Visits  Medication Dose Route Frequency Provider Last Rate Last Admin  . sodium chloride flush (NS) 0.9 % injection 10 mL  10 mL Intracatheter Once Nicholas Lose, MD        PHYSICAL EXAMINATION: ECOG  PERFORMANCE STATUS: 1 - Symptomatic but completely ambulatory  There were no vitals filed for this visit. There were no vitals filed for this visit.  LABORATORY DATA:  I have reviewed the data as listed CMP Latest Ref Rng & Units 01/13/2020 01/06/2020 01/01/2020  Glucose 70 - 99 mg/dL 118(H) 110(H) 92  BUN 6 - 20 mg/dL _0 Creatinine 0.44 - 1.00 mg/dL 0.72 0.76 0.73  Sodium 135 - 145 mmol/L 138 140 138  Potassium 3.5 - 5.1 mmol/L 3.8 3.9 4.1  Chloride 98 - 111 mmol/L 104 107 100  CO2 22 - 32 mmol/L _1 Calcium 8.9 - 10.3 mg/dL 9.4 9.5 9.8  Total Protein 6.5 - 8.1 g/dL 6.9 6.9 7.3  Total Bilirubin 0.3 - 1.2 mg/dL 0.3 0.4 0.6  Alkaline Phos 38 - 126 U/L 77 58 58  AST 15 - 41 U/L 16 13(L) 17  ALT 0 - 44 U/L _2 Lab Results  Component Value Date   WBC 7.5 01/27/2020   HGB 11.6 (L) 01/27/2020   HCT 34.7 (L) 01/27/2020   MCV 90.8 01/27/2020   PLT 339 01/27/2020   NEUTROABS 6.4 01/27/2020    ASSESSMENT & PLAN:  Malignant neoplasm  of upper-outer quadrant of right breast in female, estrogen receptor positive ([place]) 12/09/2019:Screening mammogram showed a right breast asymmetry. Diagnostic mammogram showed a 2.9cm mass at the 9 o'clock position with 2 adjacent masses at the 10 and 8 o'clock positions, 0.9cm and 1.4cm respectively, and an enlarged lymph node, 1.3cm. 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 Neoadjuvant chemotherapy with TCH Perjeta 6 cycles followed by Herceptinand Perjeta versus Kadcylamaintenance for 1 year(also could be randomized compass HER 2 trial) 2. Followed by breast conserving surgery if possible with sentinel lymph node study 3. Followed by adjuvant radiation therapy if patient had lumpectomy 4.Followed by adjuvant antiestrogen therapy SWOG S 1714 neuropathy clinical trial: No adverse effects  from participating in the trial 12/31/2019: Breast MRI: Known malignancy 3.1 cm, with non-mass enhancement total measuring 4.2 cm. Additional masses 6 mm and 3 and 4 mm --------------------------------------------------------------------------------------------------------------------------------------------- Current treatment:Cycle 2 [place] Perjeta  Echocardiogram 12/30/2019: EF 50 to 55%  Chemo toxicities: 1.  Nausea vomiting: Mild to moderate: She took antiemetics which have helped her significantly.  She threw up once. 2. bone pain due to Neulasta: This is in spite of taking ibuprofen.  Encourage her to take Tylenol next time. 3.  One episode of diarrhea. 4.  Chemo induced anemia: Today's hemoglobin is 11.6  MRI guided biopsy on the 2 additional masses: Invasive lobular cancer with LCIS, retroareolar: LCIS grade 2 Return to clinic in 3 weeks for cycle 3  No orders of the defined types were placed in this encounter.  The patient has a good understanding of the overall plan. she agrees with it. she will call with any problems that may develop before the next visit here.  Total time spent: 30 mins including face to face time and time spent for planning, charting and coordination of care  Nicholas Lose, MD 01/27/2020  I, Cloyde Reams Dorshimer, am acting as scribe for Dr. Nicholas Lose.  I have reviewed the above documentation for accuracy and completeness, and I agree with the above.

## 2020-01-27 ENCOUNTER — Encounter: Payer: Self-pay | Admitting: *Deleted

## 2020-01-27 ENCOUNTER — Inpatient Hospital Stay: Payer: BC Managed Care – PPO | Admitting: Hematology and Oncology

## 2020-01-27 ENCOUNTER — Inpatient Hospital Stay: Payer: BC Managed Care – PPO | Attending: Hematology and Oncology

## 2020-01-27 ENCOUNTER — Inpatient Hospital Stay: Payer: BC Managed Care – PPO

## 2020-01-27 ENCOUNTER — Other Ambulatory Visit: Payer: Self-pay

## 2020-01-27 DIAGNOSIS — Z170 Estrogen receptor positive status [ER+]: Secondary | ICD-10-CM

## 2020-01-27 DIAGNOSIS — Z006 Encounter for examination for normal comparison and control in clinical research program: Secondary | ICD-10-CM | POA: Insufficient documentation

## 2020-01-27 DIAGNOSIS — C50411 Malignant neoplasm of upper-outer quadrant of right female breast: Secondary | ICD-10-CM

## 2020-01-27 DIAGNOSIS — Z23 Encounter for immunization: Secondary | ICD-10-CM | POA: Diagnosis not present

## 2020-01-27 DIAGNOSIS — Z5112 Encounter for antineoplastic immunotherapy: Secondary | ICD-10-CM | POA: Diagnosis present

## 2020-01-27 DIAGNOSIS — Z95828 Presence of other vascular implants and grafts: Secondary | ICD-10-CM

## 2020-01-27 LAB — CBC WITH DIFFERENTIAL (CANCER CENTER ONLY)
Abs Immature Granulocytes: 0.02 10*3/uL (ref 0.00–0.07)
Basophils Absolute: 0 10*3/uL (ref 0.0–0.1)
Basophils Relative: 0 %
Eosinophils Absolute: 0 10*3/uL (ref 0.0–0.5)
Eosinophils Relative: 0 %
HCT: 34.7 % — ABNORMAL LOW (ref 36.0–46.0)
Hemoglobin: 11.6 g/dL — ABNORMAL LOW (ref 12.0–15.0)
Immature Granulocytes: 0 %
Lymphocytes Relative: 11 %
Lymphs Abs: 0.8 10*3/uL (ref 0.7–4.0)
MCH: 30.4 pg (ref 26.0–34.0)
MCHC: 33.4 g/dL (ref 30.0–36.0)
MCV: 90.8 fL (ref 80.0–100.0)
Monocytes Absolute: 0.2 10*3/uL (ref 0.1–1.0)
Monocytes Relative: 3 %
Neutro Abs: 6.4 10*3/uL (ref 1.7–7.7)
Neutrophils Relative %: 86 %
Platelet Count: 339 10*3/uL (ref 150–400)
RBC: 3.82 MIL/uL — ABNORMAL LOW (ref 3.87–5.11)
RDW: 13.4 % (ref 11.5–15.5)
WBC Count: 7.5 10*3/uL (ref 4.0–10.5)
nRBC: 0 % (ref 0.0–0.2)

## 2020-01-27 LAB — CMP (CANCER CENTER ONLY)
ALT: 25 U/L (ref 0–44)
AST: 20 U/L (ref 15–41)
Albumin: 3.9 g/dL (ref 3.5–5.0)
Alkaline Phosphatase: 74 U/L (ref 38–126)
Anion gap: 5 (ref 5–15)
BUN: 15 mg/dL (ref 6–20)
CO2: 27 mmol/L (ref 22–32)
Calcium: 9.5 mg/dL (ref 8.9–10.3)
Chloride: 108 mmol/L (ref 98–111)
Creatinine: 0.66 mg/dL (ref 0.44–1.00)
GFR, Estimated: >60 mL/min (ref >60)
Glucose, Bld: 114 mg/dL — ABNORMAL HIGH (ref 70–99)
Potassium: 3.9 mmol/L (ref 3.5–5.1)
Sodium: 140 mmol/L (ref 135–145)
Total Bilirubin: 0.3 mg/dL (ref 0.3–1.2)
Total Protein: 6.9 g/dL (ref 6.5–8.1)

## 2020-01-27 LAB — RESEARCH LABS

## 2020-01-27 MED ORDER — SODIUM CHLORIDE 0.9 % IV SOLN
10.0000 mg | Freq: Once | INTRAVENOUS | Status: AC
  Administered 2020-01-27: 10 mg via INTRAVENOUS
  Filled 2020-01-27: qty 10

## 2020-01-27 MED ORDER — SODIUM CHLORIDE 0.9 % IV SOLN
541.0000 mg | Freq: Once | INTRAVENOUS | Status: AC
  Administered 2020-01-27: 540 mg via INTRAVENOUS
  Filled 2020-01-27: qty 54

## 2020-01-27 MED ORDER — HEPARIN SOD (PORK) LOCK FLUSH 100 UNIT/ML IV SOLN
500.0000 [IU] | Freq: Once | INTRAVENOUS | Status: AC | PRN
  Administered 2020-01-27: 500 [IU]
  Filled 2020-01-27: qty 5

## 2020-01-27 MED ORDER — SODIUM CHLORIDE 0.9 % IV SOLN
Freq: Once | INTRAVENOUS | Status: AC
  Filled 2020-01-27: qty 250

## 2020-01-27 MED ORDER — PALONOSETRON HCL INJECTION 0.25 MG/5ML
INTRAVENOUS | Status: AC
  Filled 2020-01-27: qty 5

## 2020-01-27 MED ORDER — DIPHENHYDRAMINE HCL 25 MG PO CAPS
50.0000 mg | ORAL_CAPSULE | Freq: Once | ORAL | Status: AC
  Administered 2020-01-27: 50 mg via ORAL

## 2020-01-27 MED ORDER — INFLUENZA VAC SPLIT QUAD 0.5 ML IM SUSY
0.5000 mL | PREFILLED_SYRINGE | Freq: Once | INTRAMUSCULAR | Status: AC
  Administered 2020-01-27: 0.5 mL via INTRAMUSCULAR

## 2020-01-27 MED ORDER — DIPHENHYDRAMINE HCL 25 MG PO CAPS
ORAL_CAPSULE | ORAL | Status: AC
  Filled 2020-01-27: qty 2

## 2020-01-27 MED ORDER — SODIUM CHLORIDE 0.9% FLUSH
10.0000 mL | INTRAVENOUS | Status: DC | PRN
  Administered 2020-01-27: 10 mL
  Filled 2020-01-27: qty 10

## 2020-01-27 MED ORDER — SODIUM CHLORIDE 0.9 % IV SOLN
420.0000 mg | Freq: Once | INTRAVENOUS | Status: AC
  Administered 2020-01-27: 420 mg via INTRAVENOUS
  Filled 2020-01-27: qty 14

## 2020-01-27 MED ORDER — INFLUENZA VAC SPLIT QUAD 0.5 ML IM SUSY
PREFILLED_SYRINGE | INTRAMUSCULAR | Status: AC
  Filled 2020-01-27: qty 0.5

## 2020-01-27 MED ORDER — SODIUM CHLORIDE 0.9 % IV SOLN
150.0000 mg | Freq: Once | INTRAVENOUS | Status: AC
  Administered 2020-01-27: 150 mg via INTRAVENOUS
  Filled 2020-01-27: qty 150

## 2020-01-27 MED ORDER — ACETAMINOPHEN 325 MG PO TABS
650.0000 mg | ORAL_TABLET | Freq: Once | ORAL | Status: AC
  Administered 2020-01-27: 650 mg via ORAL

## 2020-01-27 MED ORDER — TRASTUZUMAB-DKST CHEMO 150 MG IV SOLR
6.0000 mg/kg | Freq: Once | INTRAVENOUS | Status: AC
  Administered 2020-01-27: 399 mg via INTRAVENOUS
  Filled 2020-01-27: qty 19

## 2020-01-27 MED ORDER — SODIUM CHLORIDE 0.9% FLUSH
10.0000 mL | Freq: Once | INTRAVENOUS | Status: DC
  Filled 2020-01-27: qty 10

## 2020-01-27 MED ORDER — PALONOSETRON HCL INJECTION 0.25 MG/5ML
0.2500 mg | Freq: Once | INTRAVENOUS | Status: AC
  Administered 2020-01-27: 0.25 mg via INTRAVENOUS

## 2020-01-27 MED ORDER — ACETAMINOPHEN 325 MG PO TABS
ORAL_TABLET | ORAL | Status: AC
  Filled 2020-01-27: qty 2

## 2020-01-27 MED ORDER — SODIUM CHLORIDE 0.9 % IV SOLN
75.0000 mg/m2 | Freq: Once | INTRAVENOUS | Status: AC
  Administered 2020-01-27: 130 mg via INTRAVENOUS
  Filled 2020-01-27: qty 13

## 2020-01-27 NOTE — Research (Signed)
01/27/2020 -L9379, A PROSPECTIVE OBSERVATIONAL COHORT STUDY TO DEVELOP A PREDICTIVE MODEL OFTAXANE-INDUCED PERIPHERAL NEUROPATHY IN CANCER PATIENTS. Week 4  Patient presented to the clinic with her daughter, for her 2nd treatment. Patient's concomitant medications were reviewed with the pt.  At baseline, the pt reported plantar fasciitis in bilateral feel that cause her pain and tingling, more pronounced in right foot.  PROs:Questionnaireswereprovided to the patient upon arrival to the [facility].   Collected questionnaires and checked for completeness and accuracy.  Labs:Optional research labswerecollected,per protocol,alongwith other standard of care labs ordered by provider this morning.  Physician Assessments:CTCAE and Treatment Burden forms completed and signed by Dr. Lindi Adie.Of note, the pt has no reported neuropathy symptoms.  Patient to proceed with cycle 2 treatment with no dose modifications.   Assessment for Interventions for CIPN:Reviewed with patient andCRFs completed.  Pt reports using ice packs during chemo infusion to prevent neuropathy.  She also reported using ice packs for her foot bone pain, not for neuropathy symptoms. She reports only using Udder cream daily for dry skin.  She denies any other topical agents.  Pt denies taking the following supplements and vitamins:  Vitamin E, B6, and B12, and glutamine and fish oil.  The pt reports taking Tylenol (500mg , 4-6 per week) for pain associated with a recent MRI breast biopsy.  She reported taking Ibuprofen (800mg , >1 pill per day) after her first chemo for bone pain related to the Neulasta shot.  The pt denies using any complementary or alternative treatments since her last visit.  Neuropen Assessment:Completed per protocol by this certified research RN,and recorded by Carol Ada, Research Specialist. Nathanial Rancher Assessment: Completed per protocol by this certified research RN,and recorded by Carol Ada, Research  Specialist. First Taxane Blood Draw: Did not occur on cycle 1 due to timing of taxane infusion.  Rn coordinated with phlebotomy to draw pt's sample peripherally during the pt's last 10 minutes of her taxane treatment.  The pt's blood was drawn at 12:50 pm, and the pt's docetaxel ended at 12:56 pm.  Sample was drawn within the last 10 minutes of the docetaxel infusion per protocol requirements.  Plan:Informed patient of week 8 assessments scheduled to be done on 03/09/20. Patient denied having any questions at this time.  Patient wasthanked for her time and contribution to study and wasencouraged to call clinicforany questions or concernsshe may haveprior tohernext appointment.  Brion Aliment RN, BSN, CCRP Clinical Research Nurse 01/27/2020 9:57 AM

## 2020-01-27 NOTE — Progress Notes (Signed)
Nutrition Follow-up:  Patient with right breast cancer, receiving neoadjuvant chemotherapy.   Met with patient during infusion.  Patient reports that her appetite was decreased during the week following chemotherapy.  Had taste changes (metallic taste), some nausea, 1 episode of vomiting.  Reports better appetite and taste improved after first week.  Reports typically eats small frequent meals.  Has yogurt with granola and berries, then avocado toast with broccoli and hard boiled eggs and sometimes bacon.  Lunch has been chicken salad with broccoli and tomatoes and carrots. Dinner has been vegetables and chicken (tyson lightly breaded chicken).  Has been drinking water, coffee and some diet coke.      Medications: reviewed  Labs: reviewed  Anthropometrics:   Weight 143 lb 3.2 oz today decreased from 148 lb on 9/22  145 lb on 9/1  NUTRITION DIAGNOSIS: Food and nutrition related knowledged deficit improved   INTERVENTION:  Encouraged small frequent meals including good sources of protein. Continue well balanced diet.    MONITORING, EVALUATION, GOAL:weight trends, intake   NEXT VISIT: Nov 3rd during infusion  Michelle Anthony, [place], [place] Registered Dietitian [phone] (mobile)

## 2020-01-27 NOTE — Patient Instructions (Signed)
[facility] Discharge Instructions for Patients Receiving Chemotherapy  Today you received the following chemotherapy agents: trastuzumab/pertuzumab/docetaxel/carboplatin.  To help prevent nausea and vomiting after your treatment, we encourage you to take your nausea medication as directed.   If you develop nausea and vomiting that is not controlled by your nausea medication, call the clinic.   BELOW ARE SYMPTOMS THAT SHOULD BE REPORTED IMMEDIATELY:  *FEVER GREATER THAN 100.5 F  *CHILLS WITH OR WITHOUT FEVER  NAUSEA AND VOMITING THAT IS NOT CONTROLLED WITH YOUR NAUSEA MEDICATION  *UNUSUAL SHORTNESS OF BREATH  *UNUSUAL BRUISING OR BLEEDING  TENDERNESS IN MOUTH AND THROAT WITH OR WITHOUT PRESENCE OF ULCERS  *URINARY PROBLEMS  *BOWEL PROBLEMS  UNUSUAL RASH Items with * indicate a potential emergency and should be followed up as soon as possible.  Feel free to call the clinic should you have any questions or concerns. The clinic phone number is [phone].  Please show the CHEMO ALERT CARD at check-in to the Emergency Department and triage nurse.  

## 2020-01-27 NOTE — Assessment & Plan Note (Signed)
12/09/2019:Screening mammogram showed a right breast asymmetry. Diagnostic mammogram showed a 2.9cm mass at the 9 o'clock position with 2 adjacent masses at the 10 and 8 o'clock positions, 0.9cm and 1.4cm respectively, and an enlarged lymph node, 1.3cm. 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 Neoadjuvant chemotherapy with TCH Perjeta 6 cycles followed by Herceptinand Perjeta versus Kadcylamaintenance for 1 year(also could be randomized compass HER 2 trial) 2. Followed by breast conserving surgery if possible with sentinel lymph node study 3. Followed by adjuvant radiation therapy if patient had lumpectomy 4.Followed by adjuvant antiestrogen therapy SWOG S 1714 neuropathy clinical trial: No adverse effects from participating in the trial 12/31/2019: Breast MRI: Known malignancy 3.1 cm, with non-mass enhancement total measuring 4.2 cm. Additional masses 6 mm and 3 and 4 mm --------------------------------------------------------------------------------------------------------------------------------------------- Current treatment:Cycle 2 [place] Perjeta  Echocardiogram 12/30/2019: EF 50 to 55%  Chemo toxicities: 1.  Nausea vomiting: Mild to moderate: She took antiemetics which have helped her significantly.  She threw up once. 2. bone pain due to Neulasta: This is in spite of taking ibuprofen.  Encourage her to take Tylenol next time. 3.  One episode of diarrhea.   MRI guided biopsy will need to be performed on the 2 additional masses. Return to clinic in 3 weeks for cycle 3

## 2020-01-29 ENCOUNTER — Ambulatory Visit: Payer: BC Managed Care – PPO

## 2020-01-29 ENCOUNTER — Inpatient Hospital Stay: Payer: BC Managed Care – PPO

## 2020-01-29 ENCOUNTER — Other Ambulatory Visit: Payer: Self-pay

## 2020-01-29 VITALS — BP 104/63 | HR 69 | Temp 98.9°F | Resp 18

## 2020-01-29 DIAGNOSIS — Z5112 Encounter for antineoplastic immunotherapy: Secondary | ICD-10-CM | POA: Diagnosis not present

## 2020-01-29 DIAGNOSIS — Z170 Estrogen receptor positive status [ER+]: Secondary | ICD-10-CM

## 2020-01-29 DIAGNOSIS — C50411 Malignant neoplasm of upper-outer quadrant of right female breast: Secondary | ICD-10-CM

## 2020-01-29 MED ORDER — PEGFILGRASTIM-CBQV 6 MG/0.6ML ~~LOC~~ SOSY
6.0000 mg | PREFILLED_SYRINGE | Freq: Once | SUBCUTANEOUS | Status: AC
  Administered 2020-01-29: 6 mg via SUBCUTANEOUS

## 2020-01-29 MED ORDER — PEGFILGRASTIM-CBQV 6 MG/0.6ML ~~LOC~~ SOSY
PREFILLED_SYRINGE | SUBCUTANEOUS | Status: AC
  Filled 2020-01-29: qty 0.6

## 2020-01-29 NOTE — Patient Instructions (Signed)

## 2020-02-11 ENCOUNTER — Encounter: Payer: Self-pay | Admitting: Hematology and Oncology

## 2020-02-11 NOTE — Progress Notes (Signed)
Ptwas approvedforOgivrifrom9/24/21to9/23/22for up to $25,000.The program reduces pt's copay responsibility to $0.

## 2020-02-16 NOTE — Progress Notes (Signed)
Patient Care Team: Katherina Mires, MD as PCP - General (Family Medicine) Rockwell Germany, RN as Oncology Nurse Navigator Mauro Kaufmann, RN as Oncology Nurse Navigator Erroll Luna, MD as Consulting Physician (General Surgery) Nicholas Lose, MD as Consulting Physician (Hematology and Oncology) Gery Pray, MD as Consulting Physician (Radiation Oncology)  DIAGNOSIS:    ICD-10-CM   1. Malignant neoplasm of upper-outer quadrant of right breast in female, estrogen receptor positive ([place])  C50.411    Z17.0     SUMMARY OF ONCOLOGIC HISTORY: Oncology History  Malignant neoplasm of upper-outer quadrant of right breast in female, estrogen receptor positive ([place])  12/16/2019 Initial Diagnosis   Screening mammogram  showed a right breast asymmetry. Diagnostic mammogram showed a 2.9cm mass at the 9 o'clock position with 2 adjacent masses at the 10 and 8 o'clock positions, 0.9cm and 1.4cm respectively, and an enlarged lymph node, 1.3cm.  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 The report date is 12/31/2019.   The Multi-Cancer Panel offered by [facility] includes sequencing and/or deletion duplication testing of the following 85 genes: AIP, ALK, APC, ATM, AXIN2,BAP1,  BARD1, BLM, BMPR1A, BRCA1, BRCA2, BRIP1, CASR, CDC73, CDH1, CDK4, CDKN1B, CDKN1C, CDKN2A (p14ARF), CDKN2A (p16INK4a), CEBPA, CHEK2, CTNNA1, DICER1, DIS3L2, EGFR (c.2369C>T, p.Thr790Met variant only), EPCAM (Deletion/duplication testing only), FH, FLCN, GATA2, GPC3, GREM1 (Promoter region deletion/duplication testing only), HOXB13 (c.251G>A, p.Gly84Glu), HRAS, KIT, MAX, MEN1, MET, MITF (c.952G>A, p.Glu318Lys variant only), MLH1, MSH2, MSH3, MSH6, MUTYH, NBN, NF1,  NF2, NTHL1, PALB2, PDGFRA, PHOX2B, PMS2, POLD1, POLE, POT1, PRKAR1A, PTCH1, PTEN, RAD50, RAD51C, RAD51D, RB1, RECQL4, RET, RNF43, RUNX1, SDHAF2, SDHA (sequence changes only), SDHB, SDHC, SDHD, SMAD4, SMARCA4, SMARCB1, SMARCE1, STK11, SUFU, TERC, TERT, TMEM127, TP53, TSC1, TSC2, VHL, WRN and WT1.   01/06/2020 -  Chemotherapy   The patient had dexamethasone (DECADRON) 4 MG tablet, 4 mg (100 % of original dose 4 mg), Oral, Daily, 1 of 1 cycle, Start date: 12/23/2019, End date: -- Dose modification: 4 mg (original dose 4 mg, Cycle 0) palonosetron (ALOXI) injection 0.25 mg, 0.25 mg, Intravenous,  Once, 2 of 6 cycles Administration: 0.25 mg (01/06/2020), 0.25 mg (01/27/2020) pegfilgrastim-cbqv (UDENYCA) injection 6 mg, 6 mg, Subcutaneous, Once, 2 of 6 cycles Administration: 6 mg (01/08/2020), 6 mg (01/29/2020) CARBOplatin (PARAPLATIN) 540 mg in sodium chloride 0.9 % 250 mL chemo infusion, 540 mg (100 % of original dose 541 mg), Intravenous,  Once, 2 of 6 cycles Dose modification:   (original dose 541 mg, Cycle 1) Administration: 540 mg (01/06/2020), 540 mg (01/27/2020) DOCEtaxel (TAXOTERE) 130 mg in sodium chloride 0.9 % 250 mL chemo infusion, 75 mg/m2 = 130 mg, Intravenous,  Once, 2 of 6 cycles Administration: 130 mg (01/06/2020), 130 mg (01/27/2020) fosaprepitant (EMEND) 150 mg in sodium chloride 0.9 % 145 mL IVPB, 150 mg, Intravenous,  Once, 2 of 6 cycles Administration: 150 mg (01/06/2020), 150 mg (01/27/2020) pertuzumab (PERJETA) 420 mg in sodium chloride 0.9 % 250 mL chemo infusion, 420 mg (100 % of original dose 420 mg), Intravenous, Once, 2 of 6 cycles Dose modification: 420 mg (original dose 420 mg, Cycle 1, Reason: Provider Judgment) Administration: 420 mg (01/06/2020), 420 mg (01/27/2020) trastuzumab-dkst (OGIVRI) 525 mg in sodium chloride 0.9 % 250 mL chemo  infusion, 8 mg/kg = 525 mg, Intravenous,  Once, 2 of 6 cycles Administration: 525 mg (01/06/2020), 399 mg (01/27/2020)  for chemotherapy  treatment.      CHIEF COMPLIANT: Cycle 3TCH Perjeta  INTERVAL HISTORY: Michelle Anthony is a 54 y.o. with above-mentioned history of right breast cancer currently on neoadjuvant chemotherapy with [place]. She presents to the clinic todayfora toxicity check andcycle 3.  ALLERGIES:  has No Known Allergies.  MEDICATIONS:  Current Outpatient Medications  Medication Sig Dispense Refill  . COLLAGEN PO Apply topically daily.    [name] dexamethasone (DECADRON) 4 MG tablet Take 1 tablet (4 mg total) by mouth daily. Take 1 tablet day before chemo and 1 tablet 1 day after chemo with food 12 tablet 0  . diclofenac (VOLTAREN) 50 MG EC tablet Take 50 mg by mouth 2 (two) times daily as needed.    [name] HYDROcodone-acetaminophen (NORCO/VICODIN) 5-325 MG tablet Take 1 tablet by mouth every 6 (six) hours as needed for moderate pain. 15 tablet 0  . ibuprofen (ADVIL) 800 MG tablet Take 1 tablet (800 mg total) by mouth every 8 (eight) hours as needed. 30 tablet 0  . lidocaine-prilocaine (EMLA) cream Apply to affected area once 30 g 3  . LORazepam (ATIVAN) 0.5 MG tablet Take 1 tablet (0.5 mg total) by mouth every 6 (six) hours as needed for sleep. 30 tablet 0  . Multiple Vitamin (MULTIVITAMIN) tablet Take 1 tablet by mouth daily.    . ondansetron (ZOFRAN) 8 MG tablet Take 1 tablet (8 mg total) by mouth 2 (two) times daily as needed (Nausea or vomiting). Start on the third day after chemotherapy. 30 tablet 1  . prochlorperazine (COMPAZINE) 10 MG tablet Take 1 tablet (10 mg total) by mouth every 6 (six) hours as needed (Nausea or vomiting). 30 tablet 1  . Tretinoin (RETIN-A EX) Apply topically.     No current facility-administered medications for this visit.    PHYSICAL EXAMINATION: ECOG PERFORMANCE STATUS: 1 - Symptomatic but completely ambulatory  Vitals:   02/17/20 0912  BP: 108/78  Pulse: 72  Resp: 17  Temp: 98 F (36.7 C)  SpO2: 98%   Filed Weights   02/17/20 0912  Weight: 144 lb 6.4 oz (65.5  kg)    LABORATORY DATA:  I have reviewed the data as listed CMP Latest Ref Rng & Units 02/17/2020 01/27/2020 01/13/2020  Glucose 70 - 99 mg/dL 108(H) 114(H) 118(H)  BUN 6 - 20 mg/dL 14 15 10   Creatinine 0.44 - 1.00 mg/dL 0.65 0.66 0.72  Sodium 135 - 145 mmol/L 141 140 138  Potassium 3.5 - 5.1 mmol/L 3.7 3.9 3.8  Chloride 98 - 111 mmol/L 107 108 104  CO2 22 - 32 mmol/L 25 27 27   Calcium 8.9 - 10.3 mg/dL 9.3 9.5 9.4  Total Protein 6.5 - 8.1 g/dL 6.8 6.9 6.9  Total Bilirubin 0.3 - 1.2 mg/dL 0.5 0.3 0.3  Alkaline Phos 38 - 126 U/L 74 74 77  AST 15 - 41 U/L 19 20 16   ALT 0 - 44 U/L 30 25 24     Lab Results  Component Value Date   WBC 5.4 02/17/2020   HGB 11.7 (L) 02/17/2020   HCT 34.9 (L) 02/17/2020   MCV 93.8 02/17/2020   PLT 240 02/17/2020   NEUTROABS 4.4 02/17/2020    ASSESSMENT & PLAN:  Malignant neoplasm of upper-outer quadrant of right breast in female, estrogen receptor positive ([place]) 12/09/2019:Screening mammogram showed a right breast asymmetry. Diagnostic mammogram showed  a 2.9cm mass at the 9 o'clock position with 2 adjacent masses at the 10 and 8 o'clock positions, 0.9cm and 1.4cm respectively, and an enlarged lymph node, 1.3cm. 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 Neoadjuvant chemotherapy with TCH Perjeta 6 cycles followed by Herceptinand Perjeta versus Kadcylamaintenance for 1 year(also could be randomized compass HER 2 trial) 2. Followed by breast conserving surgery if possible with sentinel lymph node study 3. Followed by adjuvant radiation therapy if patient had lumpectomy 4.Followed by adjuvant antiestrogen therapy SWOG S 1714 neuropathy clinical trial: No adverse effects from participating in the trial 12/31/2019: Breast MRI: Known malignancy 3.1 cm, with non-mass enhancement total measuring 4.2 cm. Additional  masses 6 mm and 3 and 4 mm --------------------------------------------------------------------------------------------------------------------------------------------- Current treatment:Cycle 3TCH Perjeta Echocardiogram 12/30/2019: EF 50 to 55%  Chemo toxicities: 1.Nausea vomiting: Mild to moderate: Antiemetics have worked very well 2.bone pain due to Neulasta: Tylenol and Claritin are helping 3.Chemo induced anemia: Today's hemoglobin is 11.7 4.  Alopecia  MRI guided biopsy on the 2 additional masses: Invasive lobular cancer with LCIS, retroareolar: LCIS grade 2 Return to clinic in3 weeks for cycle 4    No orders of the defined types were placed in this encounter.  The patient has a good understanding of the overall plan. she agrees with it. she will call with any problems that may develop before the next visit here.  Total time spent: 30 mins including face to face time and time spent for planning, charting and coordination of care  Nicholas Lose, MD 02/17/2020  I, Cloyde Reams Dorshimer, am acting as scribe for Dr. Nicholas Lose.  I have reviewed the above documentation for accuracy and completeness, and I agree with the above.

## 2020-02-17 ENCOUNTER — Inpatient Hospital Stay (HOSPITAL_BASED_OUTPATIENT_CLINIC_OR_DEPARTMENT_OTHER): Payer: BC Managed Care – PPO | Admitting: Hematology and Oncology

## 2020-02-17 ENCOUNTER — Inpatient Hospital Stay: Payer: BC Managed Care – PPO

## 2020-02-17 ENCOUNTER — Inpatient Hospital Stay: Payer: BC Managed Care – PPO | Attending: Hematology and Oncology

## 2020-02-17 ENCOUNTER — Encounter: Payer: Self-pay | Admitting: *Deleted

## 2020-02-17 ENCOUNTER — Other Ambulatory Visit: Payer: Self-pay

## 2020-02-17 DIAGNOSIS — D6481 Anemia due to antineoplastic chemotherapy: Secondary | ICD-10-CM | POA: Insufficient documentation

## 2020-02-17 DIAGNOSIS — Z006 Encounter for examination for normal comparison and control in clinical research program: Secondary | ICD-10-CM | POA: Diagnosis present

## 2020-02-17 DIAGNOSIS — C50411 Malignant neoplasm of upper-outer quadrant of right female breast: Secondary | ICD-10-CM | POA: Diagnosis not present

## 2020-02-17 DIAGNOSIS — R112 Nausea with vomiting, unspecified: Secondary | ICD-10-CM | POA: Diagnosis not present

## 2020-02-17 DIAGNOSIS — Z170 Estrogen receptor positive status [ER+]: Secondary | ICD-10-CM | POA: Diagnosis not present

## 2020-02-17 DIAGNOSIS — Z5111 Encounter for antineoplastic chemotherapy: Secondary | ICD-10-CM | POA: Insufficient documentation

## 2020-02-17 DIAGNOSIS — Z5189 Encounter for other specified aftercare: Secondary | ICD-10-CM | POA: Diagnosis not present

## 2020-02-17 DIAGNOSIS — T451X5A Adverse effect of antineoplastic and immunosuppressive drugs, initial encounter: Secondary | ICD-10-CM | POA: Diagnosis not present

## 2020-02-17 DIAGNOSIS — Z5112 Encounter for antineoplastic immunotherapy: Secondary | ICD-10-CM | POA: Diagnosis not present

## 2020-02-17 DIAGNOSIS — Z79899 Other long term (current) drug therapy: Secondary | ICD-10-CM | POA: Insufficient documentation

## 2020-02-17 LAB — CBC WITH DIFFERENTIAL (CANCER CENTER ONLY)
Abs Immature Granulocytes: 0.02 10*3/uL (ref 0.00–0.07)
Basophils Absolute: 0 10*3/uL (ref 0.0–0.1)
Basophils Relative: 1 %
Eosinophils Absolute: 0 10*3/uL (ref 0.0–0.5)
Eosinophils Relative: 0 %
HCT: 34.9 % — ABNORMAL LOW (ref 36.0–46.0)
Hemoglobin: 11.7 g/dL — ABNORMAL LOW (ref 12.0–15.0)
Immature Granulocytes: 0 %
Lymphocytes Relative: 14 %
Lymphs Abs: 0.8 10*3/uL (ref 0.7–4.0)
MCH: 31.5 pg (ref 26.0–34.0)
MCHC: 33.5 g/dL (ref 30.0–36.0)
MCV: 93.8 fL (ref 80.0–100.0)
Monocytes Absolute: 0.2 10*3/uL (ref 0.1–1.0)
Monocytes Relative: 4 %
Neutro Abs: 4.4 10*3/uL (ref 1.7–7.7)
Neutrophils Relative %: 81 %
Platelet Count: 240 10*3/uL (ref 150–400)
RBC: 3.72 MIL/uL — ABNORMAL LOW (ref 3.87–5.11)
RDW: 15.4 % (ref 11.5–15.5)
WBC Count: 5.4 10*3/uL (ref 4.0–10.5)
nRBC: 0 % (ref 0.0–0.2)

## 2020-02-17 LAB — CMP (CANCER CENTER ONLY)
ALT: 30 U/L (ref 0–44)
AST: 19 U/L (ref 15–41)
Albumin: 3.9 g/dL (ref 3.5–5.0)
Alkaline Phosphatase: 74 U/L (ref 38–126)
Anion gap: 9 (ref 5–15)
BUN: 14 mg/dL (ref 6–20)
CO2: 25 mmol/L (ref 22–32)
Calcium: 9.3 mg/dL (ref 8.9–10.3)
Chloride: 107 mmol/L (ref 98–111)
Creatinine: 0.65 mg/dL (ref 0.44–1.00)
GFR, Estimated: >60 mL/min (ref >60)
Glucose, Bld: 108 mg/dL — ABNORMAL HIGH (ref 70–99)
Potassium: 3.7 mmol/L (ref 3.5–5.1)
Sodium: 141 mmol/L (ref 135–145)
Total Bilirubin: 0.5 mg/dL (ref 0.3–1.2)
Total Protein: 6.8 g/dL (ref 6.5–8.1)

## 2020-02-17 MED ORDER — HEPARIN SOD (PORK) LOCK FLUSH 100 UNIT/ML IV SOLN
500.0000 [IU] | Freq: Once | INTRAVENOUS | Status: AC | PRN
  Administered 2020-02-17: 500 [IU]
  Filled 2020-02-17: qty 5

## 2020-02-17 MED ORDER — TRASTUZUMAB-DKST CHEMO 150 MG IV SOLR
6.0000 mg/kg | Freq: Once | INTRAVENOUS | Status: AC
  Administered 2020-02-17: 399 mg via INTRAVENOUS
  Filled 2020-02-17: qty 19

## 2020-02-17 MED ORDER — SODIUM CHLORIDE 0.9 % IV SOLN
150.0000 mg | Freq: Once | INTRAVENOUS | Status: AC
  Administered 2020-02-17: 150 mg via INTRAVENOUS
  Filled 2020-02-17: qty 150

## 2020-02-17 MED ORDER — PROCHLORPERAZINE MALEATE 10 MG PO TABS: 10.0000 mg | ORAL_TABLET | Freq: Four times a day (QID) | ORAL | 1 refills | Status: DC | PRN

## 2020-02-17 MED ORDER — SODIUM CHLORIDE 0.9 % IV SOLN
75.0000 mg/m2 | Freq: Once | INTRAVENOUS | Status: AC
  Administered 2020-02-17: 130 mg via INTRAVENOUS
  Filled 2020-02-17: qty 13

## 2020-02-17 MED ORDER — ACETAMINOPHEN 325 MG PO TABS
650.0000 mg | ORAL_TABLET | Freq: Once | ORAL | Status: AC
  Administered 2020-02-17: 650 mg via ORAL

## 2020-02-17 MED ORDER — PALONOSETRON HCL INJECTION 0.25 MG/5ML
0.2500 mg | Freq: Once | INTRAVENOUS | Status: AC
  Administered 2020-02-17: 0.25 mg via INTRAVENOUS

## 2020-02-17 MED ORDER — SODIUM CHLORIDE 0.9% FLUSH
10.0000 mL | INTRAVENOUS | Status: DC | PRN
  Administered 2020-02-17: 10 mL
  Filled 2020-02-17: qty 10

## 2020-02-17 MED ORDER — PALONOSETRON HCL INJECTION 0.25 MG/5ML
INTRAVENOUS | Status: AC
  Filled 2020-02-17: qty 5

## 2020-02-17 MED ORDER — DIPHENHYDRAMINE HCL 25 MG PO CAPS
ORAL_CAPSULE | ORAL | Status: AC
  Filled 2020-02-17: qty 2

## 2020-02-17 MED ORDER — SODIUM CHLORIDE 0.9 % IV SOLN
541.0000 mg | Freq: Once | INTRAVENOUS | Status: AC
  Administered 2020-02-17: 540 mg via INTRAVENOUS
  Filled 2020-02-17: qty 54

## 2020-02-17 MED ORDER — SODIUM CHLORIDE 0.9 % IV SOLN
Freq: Once | INTRAVENOUS | Status: AC
  Filled 2020-02-17: qty 250

## 2020-02-17 MED ORDER — ACETAMINOPHEN 325 MG PO TABS
ORAL_TABLET | ORAL | Status: AC
  Filled 2020-02-17: qty 2

## 2020-02-17 MED ORDER — SODIUM CHLORIDE 0.9 % IV SOLN
420.0000 mg | Freq: Once | INTRAVENOUS | Status: AC
  Administered 2020-02-17: 420 mg via INTRAVENOUS
  Filled 2020-02-17: qty 14

## 2020-02-17 MED ORDER — SODIUM CHLORIDE 0.9 % IV SOLN
10.0000 mg | Freq: Once | INTRAVENOUS | Status: AC
  Administered 2020-02-17: 10 mg via INTRAVENOUS
  Filled 2020-02-17: qty 10

## 2020-02-17 MED ORDER — DIPHENHYDRAMINE HCL 25 MG PO CAPS
50.0000 mg | ORAL_CAPSULE | Freq: Once | ORAL | Status: AC
  Administered 2020-02-17: 50 mg via ORAL

## 2020-02-17 NOTE — Patient Instructions (Signed)
[facility] Discharge Instructions for Patients Receiving Chemotherapy  Today you received the following chemotherapy agents: trastuzumab/pertuzumab/docetaxel/carboplatin.  To help prevent nausea and vomiting after your treatment, we encourage you to take your nausea medication as directed.   If you develop nausea and vomiting that is not controlled by your nausea medication, call the clinic.   BELOW ARE SYMPTOMS THAT SHOULD BE REPORTED IMMEDIATELY:  *FEVER GREATER THAN 100.5 F  *CHILLS WITH OR WITHOUT FEVER  NAUSEA AND VOMITING THAT IS NOT CONTROLLED WITH YOUR NAUSEA MEDICATION  *UNUSUAL SHORTNESS OF BREATH  *UNUSUAL BRUISING OR BLEEDING  TENDERNESS IN MOUTH AND THROAT WITH OR WITHOUT PRESENCE OF ULCERS  *URINARY PROBLEMS  *BOWEL PROBLEMS  UNUSUAL RASH Items with * indicate a potential emergency and should be followed up as soon as possible.  Feel free to call the clinic should you have any questions or concerns. The clinic phone number is [phone].  Please show the [place] at check-in to the Emergency Department and triage nurse.

## 2020-02-17 NOTE — Progress Notes (Signed)
Nutrition Follow-up:  Patient with right breast cancer, receiving neoadjuvant chemotherapy.    Met with patient during infusion.  Patient reports that her appetite is decreased during the week of chemotherapy but then increases and she eats very well.  Patient has had minimal nausea, 1 episode of vomiting.  Overall doing well nutritionally.     Medications: reviewed   Labs: reviewed   Anthropometrics:   Weight 144 lb 6.4 oz today  143 lb 3.2 oz on 10/13 148 lb on 9/22 145 lb on 9/1    NUTRITION DIAGNOSIS: Food and nutrition related knowledge deficit improved   INTERVENTION:  Continue to eat well balanced meals including good sources of protein.   Patient provided contact information and will reach out to RD if needed in the future   Next Visit: no follow-up needed Patient will reach out if needed  Bethannie Iglehart B. Zenia Resides, [place], [place] Registered Dietitian [phone] (mobile)

## 2020-02-17 NOTE — Assessment & Plan Note (Signed)
12/09/2019:Screening mammogram showed a right breast asymmetry. Diagnostic mammogram showed a 2.9cm mass at the 9 o'clock position with 2 adjacent masses at the 10 and 8 o'clock positions, 0.9cm and 1.4cm respectively, and an enlarged lymph node, 1.3cm. 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 Neoadjuvant chemotherapy with TCH Perjeta 6 cycles followed by Herceptinand Perjeta versus Kadcylamaintenance for 1 year(also could be randomized compass HER 2 trial) 2. Followed by breast conserving surgery if possible with sentinel lymph node study 3. Followed by adjuvant radiation therapy if patient had lumpectomy 4.Followed by adjuvant antiestrogen therapy SWOG S 1714 neuropathy clinical trial: No adverse effects from participating in the trial 12/31/2019: Breast MRI: Known malignancy 3.1 cm, with non-mass enhancement total measuring 4.2 cm. Additional masses 6 mm and 3 and 4 mm --------------------------------------------------------------------------------------------------------------------------------------------- Current treatment:Cycle 3TCH Perjeta Echocardiogram 12/30/2019: EF 50 to 55%  Chemo toxicities: 1.Nausea vomiting: Mild to moderate: She took antiemetics which have helped her significantly. She threw up once. 2.bone pain due to Neulasta: This is in spite of taking ibuprofen. Encourage her to take Tylenol next time. 3.One episode of diarrhea. 4.  Chemo induced anemia: Today's hemoglobin is 11.6  MRI guided biopsy on the 2 additional masses: Invasive lobular cancer with LCIS, retroareolar: LCIS grade 2 Return to clinic in3 weeks for cycle 4

## 2020-02-17 NOTE — Patient Instructions (Signed)

## 2020-02-19 ENCOUNTER — Inpatient Hospital Stay: Payer: BC Managed Care – PPO

## 2020-02-19 ENCOUNTER — Other Ambulatory Visit: Payer: Self-pay

## 2020-02-19 ENCOUNTER — Telehealth: Payer: Self-pay | Admitting: Hematology and Oncology

## 2020-02-19 ENCOUNTER — Ambulatory Visit: Payer: BC Managed Care – PPO

## 2020-02-19 VITALS — BP 105/67 | HR 68 | Resp 18

## 2020-02-19 DIAGNOSIS — C50411 Malignant neoplasm of upper-outer quadrant of right female breast: Secondary | ICD-10-CM | POA: Diagnosis not present

## 2020-02-19 DIAGNOSIS — Z170 Estrogen receptor positive status [ER+]: Secondary | ICD-10-CM

## 2020-02-19 MED ORDER — PEGFILGRASTIM-CBQV 6 MG/0.6ML ~~LOC~~ SOSY
6.0000 mg | PREFILLED_SYRINGE | Freq: Once | SUBCUTANEOUS | Status: AC
  Administered 2020-02-19: 6 mg via SUBCUTANEOUS

## 2020-02-19 NOTE — Patient Instructions (Signed)

## 2020-02-19 NOTE — Telephone Encounter (Signed)
No 1/13 los, no changes made to pt schedule  

## 2020-03-08 NOTE — Progress Notes (Signed)
Patient Care Team: Katherina Mires, MD as PCP - General (Family Medicine) Rockwell Germany, RN as Oncology Nurse Navigator Mauro Kaufmann, RN as Oncology Nurse Navigator Erroll Luna, MD as Consulting Physician (General Surgery) Nicholas Lose, MD as Consulting Physician (Hematology and Oncology) Gery Pray, MD as Consulting Physician (Radiation Oncology)  DIAGNOSIS:    ICD-10-CM   1. Malignant neoplasm of upper-outer quadrant of right breast in female, estrogen receptor positive ([facility])  C50.411    Z17.0     SUMMARY OF ONCOLOGIC HISTORY: Oncology History  Malignant neoplasm of upper-outer quadrant of right breast in female, estrogen receptor positive ([facility])  12/16/2019 Initial Diagnosis   Screening mammogram  showed a right breast asymmetry. Diagnostic mammogram showed a 2.9cm mass at the 9 o'clock position with 2 adjacent masses at the 10 and 8 o'clock positions, 0.9cm and 1.4cm respectively, and an enlarged lymph node, 1.3cm.  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 The report date is 12/31/2019.   The Multi-Cancer Panel offered by [facility] includes sequencing and/or deletion duplication testing of the following 85 genes: AIP, ALK, APC, ATM, AXIN2,BAP1,  BARD1, BLM, BMPR1A, BRCA1, BRCA2, BRIP1, CASR, CDC73, CDH1, CDK4, CDKN1B, CDKN1C, CDKN2A (p14ARF), CDKN2A (p16INK4a), CEBPA, CHEK2, CTNNA1, DICER1, DIS3L2, EGFR (c.2369C>T, p.Thr790Met variant only), EPCAM (Deletion/duplication testing only), FH, FLCN, GATA2, GPC3, GREM1 (Promoter region deletion/duplication testing only), HOXB13 (c.251G>A, p.Gly84Glu), HRAS, KIT, MAX, MEN1, MET, MITF (c.952G>A, p.Glu318Lys variant only), MLH1, MSH2, MSH3, MSH6, MUTYH, NBN, NF1,  NF2, NTHL1, PALB2, PDGFRA, PHOX2B, PMS2, POLD1, POLE, POT1, PRKAR1A, PTCH1, PTEN, RAD50, RAD51C, RAD51D, RB1, RECQL4, RET, RNF43, RUNX1, SDHAF2, SDHA (sequence changes only), SDHB, SDHC, SDHD, SMAD4, SMARCA4, SMARCB1, SMARCE1, STK11, SUFU, TERC, TERT, TMEM127, TP53, TSC1, TSC2, VHL, WRN and WT1.   01/06/2020 -  Chemotherapy   The patient had dexamethasone (DECADRON) 4 MG tablet, 4 mg (100 % of original dose 4 mg), Oral, Daily, 1 of 1 cycle, Start date: 12/23/2019, End date: -- Dose modification: 4 mg (original dose 4 mg, Cycle 0) palonosetron (ALOXI) injection 0.25 mg, 0.25 mg, Intravenous,  Once, 3 of 6 cycles Administration: 0.25 mg (01/06/2020), 0.25 mg (01/27/2020), 0.25 mg (02/17/2020) pegfilgrastim-cbqv (UDENYCA) injection 6 mg, 6 mg, Subcutaneous, Once, 3 of 6 cycles Administration: 6 mg (01/08/2020), 6 mg (01/29/2020), 6 mg (02/19/2020) CARBOplatin (PARAPLATIN) 540 mg in sodium chloride 0.9 % 250 mL chemo infusion, 540 mg (100 % of original dose 541 mg), Intravenous,  Once, 3 of 6 cycles Dose modification:   (original dose 541 mg, Cycle 1) Administration: 540 mg (01/06/2020), 540 mg (01/27/2020), 540 mg (02/17/2020) DOCEtaxel (TAXOTERE) 130 mg in sodium chloride 0.9 % 250 mL chemo infusion, 75 mg/m2 = 130 mg, Intravenous,  Once, 3 of 6 cycles Administration: 130 mg (01/06/2020), 130 mg (01/27/2020), 130 mg (02/17/2020) fosaprepitant (EMEND) 150 mg in sodium chloride 0.9 % 145 mL IVPB, 150 mg, Intravenous,  Once, 3 of 6 cycles Administration: 150 mg (01/06/2020), 150 mg (01/27/2020), 150 mg (02/17/2020) pertuzumab (PERJETA) 420 mg in sodium chloride 0.9 % 250 mL chemo infusion, 420 mg (100 % of original dose 420 mg), Intravenous, Once, 3 of 6 cycles Dose modification: 420 mg (original dose 420 mg, Cycle 1, Reason: Provider Judgment) Administration: 420 mg (01/06/2020),  420 mg (01/27/2020), 420 mg (02/17/2020) trastuzumab-dkst (OGIVRI) 525 mg in sodium chloride 0.9 % 250 mL chemo infusion, 8 mg/kg = 525  mg, Intravenous,  Once, 3 of 6 cycles Administration: 525 mg (01/06/2020), 399 mg (01/27/2020), 399 mg (02/17/2020)  for chemotherapy treatment.      CHIEF COMPLIANT: Cycle4TCH Perjeta  INTERVAL HISTORY: Michelle Anthony is a 53 y.o. with above-mentioned history of right breast cancer currently on neoadjuvant chemotherapy with [place].She presents to the clinic todayfora toxicity checkandcycle4.  She does have mild to moderate fatigue but she is able to function well.  She is working full-time and she is able to walk as well.  She does feel very tired after brief activities and then she has to rest.  She does have occasional nausea for which she takes Compazine which helps her a lot.  She has had loose stools intermittently.  She has not taken the Imodium at all.  She does take probiotics.  ALLERGIES:  has No Known Allergies.  MEDICATIONS:  Current Outpatient Medications  Medication Sig Dispense Refill  . COLLAGEN PO Apply topically daily.    [name] dexamethasone (DECADRON) 4 MG tablet Take 1 tablet (4 mg total) by mouth daily. Take 1 tablet day before chemo and 1 tablet 1 day after chemo with food 12 tablet 0  . ibuprofen (ADVIL) 800 MG tablet Take 1 tablet (800 mg total) by mouth every 8 (eight) hours as needed. 30 tablet 0  . lidocaine-prilocaine (EMLA) cream Apply to affected area once 30 g 3  . LORazepam (ATIVAN) 0.5 MG tablet Take 1 tablet (0.5 mg total) by mouth every 6 (six) hours as needed for sleep. 30 tablet 0  . Multiple Vitamin (MULTIVITAMIN) tablet Take 1 tablet by mouth daily.    . ondansetron (ZOFRAN) 8 MG tablet Take 1 tablet (8 mg total) by mouth 2 (two) times daily as needed (Nausea or vomiting). Start on the third day after chemotherapy. 30 tablet 1  . prochlorperazine (COMPAZINE) 10 MG tablet Take 1 tablet (10 mg total) by mouth every 6 (six) hours as needed (Nausea or vomiting). 60 tablet 1  . Tretinoin (RETIN-A EX) Apply topically.     No current  facility-administered medications for this visit.    PHYSICAL EXAMINATION: ECOG PERFORMANCE STATUS: 1 - Symptomatic but completely ambulatory  Vitals:   03/09/20 0813  BP: 111/68  Pulse: 75  Resp: 18  Temp: 97.9 F (36.6 C)  SpO2: 100%   Filed Weights   03/09/20 0813  Weight: 142 lb 14.4 oz (64.8 kg)    LABORATORY DATA:  I have reviewed the data as listed CMP Latest Ref Rng & Units 02/17/2020 01/27/2020 01/13/2020  Glucose 70 - 99 mg/dL 108(H) 114(H) 118(H)  BUN 6 - 20 mg/dL 14 15 10   Creatinine 0.44 - 1.00 mg/dL 0.65 0.66 0.72  Sodium 135 - 145 mmol/L 141 140 138  Potassium 3.5 - 5.1 mmol/L 3.7 3.9 3.8  Chloride 98 - 111 mmol/L 107 108 104  CO2 22 - 32 mmol/L 25 27 27   Calcium 8.9 - 10.3 mg/dL 9.3 9.5 9.4  Total Protein 6.5 - 8.1 g/dL 6.8 6.9 6.9  Total Bilirubin 0.3 - 1.2 mg/dL 0.5 0.3 0.3  Alkaline Phos 38 - 126 U/L 74 74 77  AST 15 - 41 U/L 19 20 16   ALT 0 - 44 U/L 30 25 24     Lab Results  Component Value Date   WBC 6.9 03/09/2020   HGB 11.1 (L) 03/09/2020  HCT 33.7 (L) 03/09/2020   MCV 95.7 03/09/2020   PLT 254 03/09/2020   NEUTROABS 4.7 03/09/2020    ASSESSMENT & PLAN:  Malignant neoplasm of upper-outer quadrant of right breast in female, estrogen receptor positive ([facility]) 12/09/2019:Screening mammogram showed a right breast asymmetry. Diagnostic mammogram showed a 2.9cm mass at the 9 o'clock position with 2 adjacent masses at the 10 and 8 o'clock positions, 0.9cm and 1.4cm respectively, and an enlarged lymph node, 1.3cm. 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 Neoadjuvant chemotherapy with TCH Perjeta 6 cycles followed by Herceptinand Perjeta versus Kadcylamaintenance for 1 year(also could be randomized compass HER 2 trial) 2. Followed by breast conserving surgery if possible with sentinel lymph node  study 3. Followed by adjuvant radiation therapy if patient had lumpectomy 4.Followed by adjuvant antiestrogen therapy SWOG S 1714 neuropathy clinical trial: No adverse effects from participating in the trial 12/31/2019: Breast MRI: Known malignancy 3.1 cm, with non-mass enhancement total measuring 4.2 cm. Additional masses 6 mm and 3 and 4 mm --------------------------------------------------------------------------------------------------------------------------------------------- Current treatment:Cycle4TCH Perjeta Echocardiogram 12/30/2019: EF 50 to 55%  Chemo toxicities: 1.Nausea vomiting: Mild to moderate: Antiemetics have worked very well 2.bone pain due to Neulasta: Tylenol and Claritin are helping 3.Chemo induced anemia: Today's hemoglobin is 11.1 4.  Alopecia 5.  Diarrhea due to Perjeta: Instructed her how to take Imodium   MRI guided biopsy on the 2 additional masses:Invasive lobular cancer with LCIS, retroareolar: LCIS grade 2 Return to clinic in3weeks for cycle 5   No orders of the defined types were placed in this encounter.  The patient has a good understanding of the overall plan. she agrees with it. she will call with any problems that may develop before the next visit here.  Total time spent: 30 mins including face to face time and time spent for planning, charting and coordination of care  Nicholas Lose, MD 03/09/2020  I, Cloyde Reams Dorshimer, am acting as scribe for Dr. Nicholas Lose.  I have reviewed the above documentation for accuracy and completeness, and I agree with the above.

## 2020-03-09 ENCOUNTER — Encounter: Payer: Self-pay | Admitting: *Deleted

## 2020-03-09 ENCOUNTER — Inpatient Hospital Stay: Payer: BC Managed Care – PPO

## 2020-03-09 ENCOUNTER — Other Ambulatory Visit: Payer: Self-pay

## 2020-03-09 ENCOUNTER — Inpatient Hospital Stay (HOSPITAL_BASED_OUTPATIENT_CLINIC_OR_DEPARTMENT_OTHER): Payer: BC Managed Care – PPO | Admitting: Hematology and Oncology

## 2020-03-09 DIAGNOSIS — C50411 Malignant neoplasm of upper-outer quadrant of right female breast: Secondary | ICD-10-CM | POA: Diagnosis not present

## 2020-03-09 DIAGNOSIS — Z170 Estrogen receptor positive status [ER+]: Secondary | ICD-10-CM | POA: Diagnosis not present

## 2020-03-09 DIAGNOSIS — Z006 Encounter for examination for normal comparison and control in clinical research program: Secondary | ICD-10-CM | POA: Diagnosis not present

## 2020-03-09 DIAGNOSIS — Z95828 Presence of other vascular implants and grafts: Secondary | ICD-10-CM

## 2020-03-09 LAB — CBC WITH DIFFERENTIAL (CANCER CENTER ONLY)
Abs Immature Granulocytes: 0.01 10*3/uL (ref 0.00–0.07)
Basophils Absolute: 0 10*3/uL (ref 0.0–0.1)
Basophils Relative: 0 %
Eosinophils Absolute: 0 10*3/uL (ref 0.0–0.5)
Eosinophils Relative: 0 %
HCT: 33.7 % — ABNORMAL LOW (ref 36.0–46.0)
Hemoglobin: 11.1 g/dL — ABNORMAL LOW (ref 12.0–15.0)
Immature Granulocytes: 0 %
Lymphocytes Relative: 23 %
Lymphs Abs: 1.6 10*3/uL (ref 0.7–4.0)
MCH: 31.5 pg (ref 26.0–34.0)
MCHC: 32.9 g/dL (ref 30.0–36.0)
MCV: 95.7 fL (ref 80.0–100.0)
Monocytes Absolute: 0.6 10*3/uL (ref 0.1–1.0)
Monocytes Relative: 8 %
Neutro Abs: 4.7 10*3/uL (ref 1.7–7.7)
Neutrophils Relative %: 69 %
Platelet Count: 254 10*3/uL (ref 150–400)
RBC: 3.52 MIL/uL — ABNORMAL LOW (ref 3.87–5.11)
RDW: 16.5 % — ABNORMAL HIGH (ref 11.5–15.5)
WBC Count: 6.9 10*3/uL (ref 4.0–10.5)
nRBC: 0 % (ref 0.0–0.2)

## 2020-03-09 LAB — CMP (CANCER CENTER ONLY)
ALT: 25 U/L (ref 0–44)
AST: 17 U/L (ref 15–41)
Albumin: 4 g/dL (ref 3.5–5.0)
Alkaline Phosphatase: 80 U/L (ref 38–126)
Anion gap: 11 (ref 5–15)
BUN: 14 mg/dL (ref 6–20)
CO2: 25 mmol/L (ref 22–32)
Calcium: 9.4 mg/dL (ref 8.9–10.3)
Chloride: 105 mmol/L (ref 98–111)
Creatinine: 0.68 mg/dL (ref 0.44–1.00)
GFR, Estimated: >60 mL/min (ref >60)
Glucose, Bld: 103 mg/dL — ABNORMAL HIGH (ref 70–99)
Potassium: 3.9 mmol/L (ref 3.5–5.1)
Sodium: 141 mmol/L (ref 135–145)
Total Bilirubin: 0.4 mg/dL (ref 0.3–1.2)
Total Protein: 6.8 g/dL (ref 6.5–8.1)

## 2020-03-09 LAB — RESEARCH LABS

## 2020-03-09 MED ORDER — SODIUM CHLORIDE 0.9 % IV SOLN
420.0000 mg | Freq: Once | INTRAVENOUS | Status: AC
  Administered 2020-03-09: 420 mg via INTRAVENOUS
  Filled 2020-03-09: qty 14

## 2020-03-09 MED ORDER — ACETAMINOPHEN 325 MG PO TABS
650.0000 mg | ORAL_TABLET | Freq: Once | ORAL | Status: AC
  Administered 2020-03-09: 650 mg via ORAL

## 2020-03-09 MED ORDER — PALONOSETRON HCL INJECTION 0.25 MG/5ML
INTRAVENOUS | Status: AC
  Filled 2020-03-09: qty 5

## 2020-03-09 MED ORDER — DIPHENHYDRAMINE HCL 25 MG PO CAPS
50.0000 mg | ORAL_CAPSULE | Freq: Once | ORAL | Status: AC
  Administered 2020-03-09: 50 mg via ORAL

## 2020-03-09 MED ORDER — SODIUM CHLORIDE 0.9 % IV SOLN
10.0000 mg | Freq: Once | INTRAVENOUS | Status: AC
  Administered 2020-03-09: 10 mg via INTRAVENOUS
  Filled 2020-03-09: qty 10

## 2020-03-09 MED ORDER — TRASTUZUMAB-DKST CHEMO 150 MG IV SOLR
6.0000 mg/kg | Freq: Once | INTRAVENOUS | Status: AC
  Administered 2020-03-09: 399 mg via INTRAVENOUS
  Filled 2020-03-09: qty 19

## 2020-03-09 MED ORDER — SODIUM CHLORIDE 0.9% FLUSH
10.0000 mL | Freq: Once | INTRAVENOUS | Status: DC
  Filled 2020-03-09: qty 10

## 2020-03-09 MED ORDER — ACETAMINOPHEN 325 MG PO TABS
ORAL_TABLET | ORAL | Status: AC
  Filled 2020-03-09: qty 2

## 2020-03-09 MED ORDER — SODIUM CHLORIDE 0.9 % IV SOLN
75.0000 mg/m2 | Freq: Once | INTRAVENOUS | Status: AC
  Administered 2020-03-09: 130 mg via INTRAVENOUS
  Filled 2020-03-09: qty 13

## 2020-03-09 MED ORDER — DIPHENHYDRAMINE HCL 25 MG PO CAPS
ORAL_CAPSULE | ORAL | Status: AC
  Filled 2020-03-09: qty 2

## 2020-03-09 MED ORDER — SODIUM CHLORIDE 0.9 % IV SOLN
150.0000 mg | Freq: Once | INTRAVENOUS | Status: AC
  Administered 2020-03-09: 150 mg via INTRAVENOUS
  Filled 2020-03-09: qty 150

## 2020-03-09 MED ORDER — HEPARIN SOD (PORK) LOCK FLUSH 100 UNIT/ML IV SOLN
500.0000 [IU] | Freq: Once | INTRAVENOUS | Status: AC | PRN
  Administered 2020-03-09: 500 [IU]
  Filled 2020-03-09: qty 5

## 2020-03-09 MED ORDER — SODIUM CHLORIDE 0.9% FLUSH
10.0000 mL | INTRAVENOUS | Status: DC | PRN
  Administered 2020-03-09: 10 mL
  Filled 2020-03-09: qty 10

## 2020-03-09 MED ORDER — PALONOSETRON HCL INJECTION 0.25 MG/5ML
0.2500 mg | Freq: Once | INTRAVENOUS | Status: AC
  Administered 2020-03-09: 0.25 mg via INTRAVENOUS

## 2020-03-09 MED ORDER — SODIUM CHLORIDE 0.9 % IV SOLN
541.0000 mg | Freq: Once | INTRAVENOUS | Status: AC
  Administered 2020-03-09: 540 mg via INTRAVENOUS
  Filled 2020-03-09: qty 54

## 2020-03-09 MED ORDER — SODIUM CHLORIDE 0.9 % IV SOLN
Freq: Once | INTRAVENOUS | Status: AC
  Filled 2020-03-09: qty 250

## 2020-03-09 NOTE — Patient Instructions (Signed)
[facility] Discharge Instructions for Patients Receiving Chemotherapy  Today you received the following chemotherapy agents: trastuzumab, pertuzumab, taxotere, carboplatin  To help prevent nausea and vomiting after your treatment, we encourage you to take your nausea medication as directed.   If you develop nausea and vomiting that is not controlled by your nausea medication, call the clinic.   BELOW ARE SYMPTOMS THAT SHOULD BE REPORTED IMMEDIATELY:  *FEVER GREATER THAN 100.5 F  *CHILLS WITH OR WITHOUT FEVER  NAUSEA AND VOMITING THAT IS NOT CONTROLLED WITH YOUR NAUSEA MEDICATION  *UNUSUAL SHORTNESS OF BREATH  *UNUSUAL BRUISING OR BLEEDING  TENDERNESS IN MOUTH AND THROAT WITH OR WITHOUT PRESENCE OF ULCERS  *URINARY PROBLEMS  *BOWEL PROBLEMS  UNUSUAL RASH Items with * indicate a potential emergency and should be followed up as soon as possible.  Feel free to call the clinic should you have any questions or concerns. The clinic phone number is [phone].  Please show the [facility] at check-in to the Emergency Department and triage nurse.

## 2020-03-09 NOTE — Assessment & Plan Note (Signed)
12/09/2019:Screening mammogram showed a right breast asymmetry. Diagnostic mammogram showed a 2.9cm mass at the 9 o'clock position with 2 adjacent masses at the 10 and 8 o'clock positions, 0.9cm and 1.4cm respectively, and an enlarged lymph node, 1.3cm. 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 Neoadjuvant chemotherapy with TCH Perjeta 6 cycles followed by Herceptinand Perjeta versus Kadcylamaintenance for 1 year(also could be randomized compass HER 2 trial) 2. Followed by breast conserving surgery if possible with sentinel lymph node study 3. Followed by adjuvant radiation therapy if patient had lumpectomy 4.Followed by adjuvant antiestrogen therapy SWOG S 1714 neuropathy clinical trial: No adverse effects from participating in the trial 12/31/2019: Breast MRI: Known malignancy 3.1 cm, with non-mass enhancement total measuring 4.2 cm. Additional masses 6 mm and 3 and 4 mm --------------------------------------------------------------------------------------------------------------------------------------------- Current treatment:Cycle4TCH Perjeta Echocardiogram 12/30/2019: EF 50 to 55%  Chemo toxicities: 1.Nausea vomiting: Mild to moderate: Antiemetics have worked very well 2.bone pain due to Neulasta: Tylenol and Claritin are helping 3.Chemo induced anemia: Today's hemoglobin is 11.7 4.  Alopecia  MRI guided biopsy on the 2 additional masses:Invasive lobular cancer with LCIS, retroareolar: LCIS grade 2 Return to clinic in3weeks for cycle 5

## 2020-03-09 NOTE — Research (Signed)
03/09/2020 -[id number], A PROSPECTIVE OBSERVATIONAL COHORT STUDY TO DEVELOP A PREDICTIVE MODEL OFTAXANE-INDUCED PERIPHERAL NEUROPATHY IN CANCER PATIENTS. Week 8 Patient presented to the clinic with her daughter, for her 4th chemo treatment. Patient's concomitant medications were reviewed with the pt. The pt reported some bilateral "big toe" tenderness.  PROs:Questionnaireswereprovidedto the patient after arrival to the [facility].   Collected questionnaires and checked for completeness and accuracy.  Labs:Optional research labswerecollected,per protocol,alongwith other standard of care labs ordered by provider this morning.  Physician Assessments:CTCAE and Treatment Burden forms completed and signed by Dr. Lindi Adie.The MD reported grade 1 paresthesia related to her "big toe tenderness".    Patient to proceed with cycle 4 treatment with no dose modifications.   Assessment for Interventions for CIPN:Reviewed with patient andCRFs completed.  Pt reports using ice packs during chemo infusion to prevent neuropathy. She reports only using Udder cream daily for dry skin.  She denies any other topical agents.  Pt denies taking fish oil.  The pt does report taking a multivitamin.  The nurse asked the pt to provide the amounts of Vitamin E, B6, and B12 in her multivitamin.  The pt reports taking Tylenol (500mg , 4-6 per week) for bone pain related to the injection following her chemo.  The pt denies using any complementary or alternative treatments since her last visit.  Neuropen Assessment:Completed per protocol bythiscertified research RN,and recorded by Carol Ada, Research Specialist. Nathanial Rancher Assessment:Completed per protocol bythiscertified research RN,and recorded by Carol Ada, Research Specialist. Plan:Informed patient of week 12 assessments scheduled to be done 03/30/20.  Patient denied having any questions at this time.Patientwasthanked for her time and contribution to study and  wasencouraged to call clinicforany questions or concernsshe may haveprior tohernextappointment. Brion Aliment RN, BSN, CCRP  Clinical Research Nurse 03/09/2020 9:16 AM

## 2020-03-11 ENCOUNTER — Inpatient Hospital Stay: Payer: BC Managed Care – PPO

## 2020-03-11 ENCOUNTER — Other Ambulatory Visit: Payer: Self-pay

## 2020-03-11 VITALS — BP 112/66 | HR 69 | Temp 98.1°F | Resp 18

## 2020-03-11 DIAGNOSIS — Z170 Estrogen receptor positive status [ER+]: Secondary | ICD-10-CM

## 2020-03-11 DIAGNOSIS — C50411 Malignant neoplasm of upper-outer quadrant of right female breast: Secondary | ICD-10-CM

## 2020-03-11 MED ORDER — PEGFILGRASTIM-CBQV 6 MG/0.6ML ~~LOC~~ SOSY
PREFILLED_SYRINGE | SUBCUTANEOUS | Status: AC
  Filled 2020-03-11: qty 0.6

## 2020-03-11 MED ORDER — PEGFILGRASTIM-CBQV 6 MG/0.6ML ~~LOC~~ SOSY
6.0000 mg | PREFILLED_SYRINGE | Freq: Once | SUBCUTANEOUS | Status: AC
  Administered 2020-03-11: 6 mg via SUBCUTANEOUS

## 2020-03-11 NOTE — Patient Instructions (Signed)

## 2020-03-14 ENCOUNTER — Telehealth: Payer: Self-pay | Admitting: Hematology and Oncology

## 2020-03-14 NOTE — Telephone Encounter (Signed)
Scheduled per 11/24 los. Pt will receive an updated appt calendar per next visit appt notes

## 2020-03-29 NOTE — Progress Notes (Signed)
Patient Care Team: Katherina Mires, MD as PCP - General (Family Medicine) Rockwell Germany, RN as Oncology Nurse Navigator Mauro Kaufmann, RN as Oncology Nurse Navigator Erroll Luna, MD as Consulting Physician (General Surgery) Nicholas Lose, MD as Consulting Physician (Hematology and Oncology) Gery Pray, MD as Consulting Physician (Radiation Oncology)  DIAGNOSIS:    ICD-10-CM   1. Malignant neoplasm of upper-outer quadrant of right breast in female, estrogen receptor positive ([place])  C50.411 MR BREAST BILATERAL W [place] CAD   Z17.0     SUMMARY OF ONCOLOGIC HISTORY: Oncology History  Malignant neoplasm of upper-outer quadrant of right breast in female, estrogen receptor positive ([place])  12/16/2019 Initial Diagnosis   Screening mammogram  showed a right breast asymmetry. Diagnostic mammogram showed a 2.9cm mass at the 9 o'clock position with 2 adjacent masses at the 10 and 8 o'clock positions, 0.9cm and 1.4cm respectively, and an enlarged lymph node, 1.3cm.  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 The report date is 12/31/2019.   The Multi-Cancer Panel offered by [facility] includes sequencing and/or deletion duplication testing of the following 85 genes: AIP, ALK, APC, ATM, AXIN2,BAP1,  BARD1, BLM, BMPR1A, BRCA1, BRCA2, BRIP1, CASR, CDC73, CDH1, CDK4, CDKN1B, CDKN1C, CDKN2A (p14ARF), CDKN2A (p16INK4a), CEBPA, CHEK2, CTNNA1, DICER1, DIS3L2, EGFR (c.2369C>T, p.Thr790Met variant only), EPCAM (Deletion/duplication testing only), FH, FLCN, GATA2, GPC3, GREM1 (Promoter region deletion/duplication testing only), HOXB13 (c.251G>A, p.Gly84Glu), HRAS, KIT, MAX, MEN1, MET, MITF (c.952G>A, p.Glu318Lys variant only),  MLH1, MSH2, MSH3, MSH6, MUTYH, NBN, NF1, NF2, NTHL1, PALB2, PDGFRA, PHOX2B, PMS2, POLD1, POLE, POT1, PRKAR1A, PTCH1, PTEN, RAD50, RAD51C, RAD51D, RB1, RECQL4, RET, RNF43, RUNX1, SDHAF2, SDHA (sequence changes only), SDHB, SDHC, SDHD, SMAD4, SMARCA4, SMARCB1, SMARCE1, STK11, SUFU, TERC, TERT, TMEM127, TP53, TSC1, TSC2, VHL, WRN and WT1.   01/06/2020 -  Chemotherapy   The patient had dexamethasone (DECADRON) 4 MG tablet, 4 mg (100 % of original dose 4 mg), Oral, Daily, 1 of 1 cycle, Start date: 12/23/2019, End date: -- Dose modification: 4 mg (original dose 4 mg, Cycle 0) palonosetron (ALOXI) injection 0.25 mg, 0.25 mg, Intravenous,  Once, 4 of 6 cycles Administration: 0.25 mg (01/06/2020), 0.25 mg (01/27/2020), 0.25 mg (02/17/2020), 0.25 mg (03/09/2020) pegfilgrastim-cbqv (UDENYCA) injection 6 mg, 6 mg, Subcutaneous, Once, 4 of 6 cycles Administration: 6 mg (01/08/2020), 6 mg (01/29/2020), 6 mg (02/19/2020), 6 mg (03/11/2020) CARBOplatin (PARAPLATIN) 540 mg in sodium chloride 0.9 % 250 mL chemo infusion, 540 mg (100 % of original dose 541 mg), Intravenous,  Once, 4 of 6 cycles Dose modification:   (original dose 541 mg, Cycle 1) Administration: 540 mg (01/06/2020), 540 mg (01/27/2020), 540 mg (02/17/2020), 540 mg (03/09/2020) DOCEtaxel (TAXOTERE) 130 mg in sodium chloride 0.9 % 250 mL chemo infusion, 75 mg/m2 = 130 mg, Intravenous,  Once, 4 of 6 cycles Administration: 130 mg (01/06/2020), 130 mg (01/27/2020), 130 mg (02/17/2020), 130 mg (03/09/2020) fosaprepitant (EMEND) 150 mg in sodium chloride 0.9 % 145 mL IVPB, 150 mg, Intravenous,  Once, 4 of 6 cycles Administration: 150 mg (01/06/2020), 150 mg (01/27/2020), 150 mg (02/17/2020), 150 mg (03/09/2020) pertuzumab (PERJETA) 420 mg in sodium chloride 0.9 % 250 mL chemo infusion, 420 mg (100 % of original dose 420 mg), Intravenous,  Once, 4 of 6 cycles Dose modification: 420 mg (original dose 420 mg, Cycle 1, Reason: Provider Judgment) Administration: 420 mg  (01/06/2020), 420 mg (01/27/2020), 420 mg (02/17/2020), 420 mg (03/09/2020) trastuzumab-dkst (OGIVRI) 525 mg in sodium chloride 0.9 % 250 mL chemo infusion, 8 mg/kg = 525 mg, Intravenous,  Once, 4 of 6 cycles Administration: 525 mg (01/06/2020), 399 mg (01/27/2020), 399 mg (02/17/2020), 399 mg (03/09/2020)  for chemotherapy treatment.      CHIEF COMPLIANT: Cycle5TCH Perjeta  INTERVAL HISTORY: Michelle Anthony is a 54 y.o. with above-mentioned history of right breast cancer currently on neoadjuvant chemotherapy with [place].She presents to the clinic todayfora toxicity checkandcycle5.  She has had mild nausea after last cycle of chemotherapy.  She continues to have fatigue which gets better.  Denies any neuropathy.  ALLERGIES:  has No Known Allergies.  MEDICATIONS:  Current Outpatient Medications  Medication Sig Dispense Refill  . COLLAGEN PO Apply topically daily.    [name] dexamethasone (DECADRON) 4 MG tablet Take 1 tablet (4 mg total) by mouth daily. Take 1 tablet day before chemo and 1 tablet 1 day after chemo with food 12 tablet 0  . ibuprofen (ADVIL) 800 MG tablet Take 1 tablet (800 mg total) by mouth every 8 (eight) hours as needed. 30 tablet 0  . lidocaine-prilocaine (EMLA) cream Apply to affected area once 30 g 3  . LORazepam (ATIVAN) 0.5 MG tablet Take 1 tablet (0.5 mg total) by mouth every 6 (six) hours as needed for sleep. 30 tablet 0  . Multiple Vitamin (MULTIVITAMIN) tablet Take 1 tablet by mouth daily.    . ondansetron (ZOFRAN) 8 MG tablet Take 1 tablet (8 mg total) by mouth 2 (two) times daily as needed (Nausea or vomiting). Start on the third day after chemotherapy. 30 tablet 1  . prochlorperazine (COMPAZINE) 10 MG tablet Take 1 tablet (10 mg total) by mouth every 6 (six) hours as needed (Nausea or vomiting). 60 tablet 1  . Tretinoin (RETIN-A EX) Apply topically.     No current facility-administered medications for this visit.    PHYSICAL EXAMINATION: ECOG  PERFORMANCE STATUS: 1 - Symptomatic but completely ambulatory  Vitals:   03/30/20 0823  BP: 114/71  Pulse: 73  Resp: 17  Temp: 98.3 F (36.8 C)  SpO2: 100%   Filed Weights   03/30/20 0823  Weight: 147 lb 4.8 oz (66.8 kg)    LABORATORY DATA:  I have reviewed the data as listed CMP Latest Ref Rng & Units 03/30/2020 03/09/2020 02/17/2020  Glucose 70 - 99 mg/dL 102(H) 103(H) 108(H)  BUN 6 - 20 mg/dL _0 Creatinine 0.44 - 1.00 mg/dL 0.66 0.68 0.65  Sodium 135 - 145 mmol/L 140 141 141  Potassium 3.5 - 5.1 mmol/L 3.9 3.9 3.7  Chloride 98 - 111 mmol/L 106 105 107  CO2 22 - 32 mmol/L _1 Calcium 8.9 - 10.3 mg/dL 9.8 9.4 9.3  Total Protein 6.5 - 8.1 g/dL 6.8 6.8 6.8  Total Bilirubin 0.3 - 1.2 mg/dL 0.5 0.4 0.5  Alkaline Phos 38 - 126 U/L 72 80 74  AST 15 - 41 U/L _2 ALT 0 - 44 U/L 32 25 30    Lab Results  Component Value Date   WBC 7.3 03/30/2020   HGB 10.9 (L) 03/30/2020   HCT 33.1 (L) 03/30/2020   MCV 97.1 03/30/2020   PLT 255 03/30/2020   NEUTROABS 5.3 03/30/2020    ASSESSMENT & PLAN:  Malignant neoplasm of upper-outer quadrant of right breast in female, estrogen receptor positive ([place]) 12/09/2019:Screening mammogram showed a right breast asymmetry. Diagnostic mammogram showed a 2.9cm mass at the 9 o'clock position with 2 adjacent masses at the 10 and 8 o'clock positions, 0.9cm and 1.4cm respectively, and an enlarged lymph node, 1.3cm. 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 Neoadjuvant chemotherapy with TCH Perjeta 6 cycles followed by Herceptinand Perjeta versus Kadcylamaintenance for 1 year(also could be randomized compass HER 2 trial) 2. Followed by breast conserving surgery if possible with sentinel lymph node study 3. Followed by adjuvant radiation therapy if patient had lumpectomy 4.Followed by  adjuvant antiestrogen therapy SWOG S 1714 neuropathy clinical trial: No adverse effects from participating in the trial 12/31/2019: Breast MRI: Known malignancy 3.1 cm, with non-mass enhancement total measuring 4.2 cm. Additional masses 6 mm and 3 and 4 mm --------------------------------------------------------------------------------------------------------------------------------------------- Current treatment:Cycle5TCH Perjeta Echocardiogram 12/30/2019: EF 50 to 55%  Chemo toxicities: 1.Nausea vomiting: Mild to moderate:Antiemetics have worked very well 2.bone pain due to Neulasta:Tylenol and Claritin are helping 3.Chemo induced anemia: Today's hemoglobin is 10.9 4.Alopecia 5.  Diarrhea due to Perjeta: Improved with Imodium   MRI guided biopsy on the 2 additional masses:Invasive lobular cancer with LCIS, retroareolar: LCIS grade 2 Return to clinic in3weeks for cycle6 Breast MRI will be ordered after cycle 6. Sent a message to [place] for surgical planning and TB presentation.    Orders Placed This Encounter  Procedures  . MR BREAST BILATERAL W WO CONTRAST INC CAD    Standing Status:   Future    Standing Expiration Date:   03/30/2021    Order Specific Question:   If indicated for the ordered procedure, I authorize the administration of contrast media per Radiology protocol    Answer:   Yes    Order Specific Question:   What is the patient's sedation requirement?    Answer:   No Sedation    Order Specific Question:   Does the patient have a pacemaker or implanted devices?    Answer:   No    Order Specific Question:   Radiology Contrast Protocol - do NOT remove file path    Answer:   [email or web address]    Order Specific Question:   Preferred imaging location?    Answer:   [facility] (table limit-550lbs)   The patient has a good understanding of the overall plan. she agrees with it. she will call with any problems  that may develop before the next visit here.  Total time spent: 30 mins including face to face time and time spent for planning, charting and coordination of care  Nicholas Lose, MD 03/30/2020  I, Cloyde Reams Dorshimer, am acting as scribe for Dr. Nicholas Lose.  I have reviewed the above documentation for accuracy and completeness, and I agree with the above.

## 2020-03-30 ENCOUNTER — Encounter: Payer: Self-pay | Admitting: *Deleted

## 2020-03-30 ENCOUNTER — Inpatient Hospital Stay: Payer: BC Managed Care – PPO | Attending: Hematology and Oncology

## 2020-03-30 ENCOUNTER — Other Ambulatory Visit: Payer: Self-pay | Admitting: *Deleted

## 2020-03-30 ENCOUNTER — Other Ambulatory Visit: Payer: Self-pay

## 2020-03-30 ENCOUNTER — Inpatient Hospital Stay (HOSPITAL_BASED_OUTPATIENT_CLINIC_OR_DEPARTMENT_OTHER): Payer: BC Managed Care – PPO | Admitting: Hematology and Oncology

## 2020-03-30 ENCOUNTER — Inpatient Hospital Stay: Payer: BC Managed Care – PPO

## 2020-03-30 DIAGNOSIS — C50411 Malignant neoplasm of upper-outer quadrant of right female breast: Secondary | ICD-10-CM

## 2020-03-30 DIAGNOSIS — Z95828 Presence of other vascular implants and grafts: Secondary | ICD-10-CM

## 2020-03-30 DIAGNOSIS — Z23 Encounter for immunization: Secondary | ICD-10-CM | POA: Insufficient documentation

## 2020-03-30 DIAGNOSIS — D6481 Anemia due to antineoplastic chemotherapy: Secondary | ICD-10-CM

## 2020-03-30 DIAGNOSIS — Z170 Estrogen receptor positive status [ER+]: Secondary | ICD-10-CM | POA: Diagnosis not present

## 2020-03-30 DIAGNOSIS — Z5189 Encounter for other specified aftercare: Secondary | ICD-10-CM | POA: Diagnosis not present

## 2020-03-30 DIAGNOSIS — Z006 Encounter for examination for normal comparison and control in clinical research program: Secondary | ICD-10-CM

## 2020-03-30 DIAGNOSIS — D0501 Lobular carcinoma in situ of right breast: Secondary | ICD-10-CM | POA: Insufficient documentation

## 2020-03-30 LAB — CBC WITH DIFFERENTIAL (CANCER CENTER ONLY)
Abs Immature Granulocytes: 0.02 10*3/uL (ref 0.00–0.07)
Basophils Absolute: 0 10*3/uL (ref 0.0–0.1)
Basophils Relative: 1 %
Eosinophils Absolute: 0 10*3/uL (ref 0.0–0.5)
Eosinophils Relative: 0 %
HCT: 33.1 % — ABNORMAL LOW (ref 36.0–46.0)
Hemoglobin: 10.9 g/dL — ABNORMAL LOW (ref 12.0–15.0)
Immature Granulocytes: 0 %
Lymphocytes Relative: 19 %
Lymphs Abs: 1.4 10*3/uL (ref 0.7–4.0)
MCH: 32 pg (ref 26.0–34.0)
MCHC: 32.9 g/dL (ref 30.0–36.0)
MCV: 97.1 fL (ref 80.0–100.0)
Monocytes Absolute: 0.5 10*3/uL (ref 0.1–1.0)
Monocytes Relative: 7 %
Neutro Abs: 5.3 10*3/uL (ref 1.7–7.7)
Neutrophils Relative %: 73 %
Platelet Count: 255 10*3/uL (ref 150–400)
RBC: 3.41 MIL/uL — ABNORMAL LOW (ref 3.87–5.11)
RDW: 15.7 % — ABNORMAL HIGH (ref 11.5–15.5)
WBC Count: 7.3 10*3/uL (ref 4.0–10.5)
nRBC: 0 % (ref 0.0–0.2)

## 2020-03-30 LAB — CMP (CANCER CENTER ONLY)
ALT: 32 U/L (ref 0–44)
AST: 21 U/L (ref 15–41)
Albumin: 3.8 g/dL (ref 3.5–5.0)
Alkaline Phosphatase: 72 U/L (ref 38–126)
Anion gap: 8 (ref 5–15)
BUN: 16 mg/dL (ref 6–20)
CO2: 26 mmol/L (ref 22–32)
Calcium: 9.8 mg/dL (ref 8.9–10.3)
Chloride: 106 mmol/L (ref 98–111)
Creatinine: 0.66 mg/dL (ref 0.44–1.00)
GFR, Estimated: >60 mL/min (ref >60)
Glucose, Bld: 102 mg/dL — ABNORMAL HIGH (ref 70–99)
Potassium: 3.9 mmol/L (ref 3.5–5.1)
Sodium: 140 mmol/L (ref 135–145)
Total Bilirubin: 0.5 mg/dL (ref 0.3–1.2)
Total Protein: 6.8 g/dL (ref 6.5–8.1)

## 2020-03-30 MED ORDER — SODIUM CHLORIDE 0.9 % IV SOLN
536.0000 mg | Freq: Once | INTRAVENOUS | Status: AC
  Administered 2020-03-30: 540 mg via INTRAVENOUS
  Filled 2020-03-30: qty 54

## 2020-03-30 MED ORDER — SODIUM CHLORIDE 0.9 % IV SOLN
Freq: Once | INTRAVENOUS | Status: AC
  Filled 2020-03-30: qty 250

## 2020-03-30 MED ORDER — SODIUM CHLORIDE 0.9% FLUSH
10.0000 mL | Freq: Once | INTRAVENOUS | Status: AC
  Administered 2020-03-30: 10 mL
  Filled 2020-03-30: qty 10

## 2020-03-30 MED ORDER — TRASTUZUMAB-DKST CHEMO 150 MG IV SOLR
6.0000 mg/kg | Freq: Once | INTRAVENOUS | Status: AC
  Administered 2020-03-30: 399 mg via INTRAVENOUS
  Filled 2020-03-30: qty 19

## 2020-03-30 MED ORDER — HEPARIN SOD (PORK) LOCK FLUSH 100 UNIT/ML IV SOLN
500.0000 [IU] | Freq: Once | INTRAVENOUS | Status: AC | PRN
  Administered 2020-03-30: 500 [IU]
  Filled 2020-03-30: qty 5

## 2020-03-30 MED ORDER — ACETAMINOPHEN 325 MG PO TABS
ORAL_TABLET | ORAL | Status: AC
  Filled 2020-03-30: qty 2

## 2020-03-30 MED ORDER — SODIUM CHLORIDE 0.9% FLUSH
10.0000 mL | INTRAVENOUS | Status: DC | PRN
  Administered 2020-03-30: 10 mL
  Filled 2020-03-30: qty 10

## 2020-03-30 MED ORDER — SODIUM CHLORIDE 0.9 % IV SOLN
420.0000 mg | Freq: Once | INTRAVENOUS | Status: AC
  Administered 2020-03-30: 420 mg via INTRAVENOUS
  Filled 2020-03-30: qty 14

## 2020-03-30 MED ORDER — PALONOSETRON HCL INJECTION 0.25 MG/5ML
0.2500 mg | Freq: Once | INTRAVENOUS | Status: AC
  Administered 2020-03-30: 0.25 mg via INTRAVENOUS

## 2020-03-30 MED ORDER — DOCETAXEL CHEMO INJECTION 160 MG/16ML
75.0000 mg/m2 | Freq: Once | INTRAVENOUS | Status: AC
  Administered 2020-03-30: 130 mg via INTRAVENOUS
  Filled 2020-03-30: qty 13

## 2020-03-30 MED ORDER — DIPHENHYDRAMINE HCL 25 MG PO CAPS
50.0000 mg | ORAL_CAPSULE | Freq: Once | ORAL | Status: AC
  Administered 2020-03-30: 50 mg via ORAL

## 2020-03-30 MED ORDER — ACETAMINOPHEN 325 MG PO TABS
650.0000 mg | ORAL_TABLET | Freq: Once | ORAL | Status: AC
  Administered 2020-03-30: 650 mg via ORAL

## 2020-03-30 MED ORDER — PALONOSETRON HCL INJECTION 0.25 MG/5ML
INTRAVENOUS | Status: AC
  Filled 2020-03-30: qty 5

## 2020-03-30 MED ORDER — DIPHENHYDRAMINE HCL 25 MG PO CAPS
ORAL_CAPSULE | ORAL | Status: AC
  Filled 2020-03-30: qty 2

## 2020-03-30 MED ORDER — SODIUM CHLORIDE 0.9 % IV SOLN
10.0000 mg | Freq: Once | INTRAVENOUS | Status: AC
  Administered 2020-03-30: 10 mg via INTRAVENOUS
  Filled 2020-03-30: qty 10

## 2020-03-30 MED ORDER — SODIUM CHLORIDE 0.9 % IV SOLN
150.0000 mg | Freq: Once | INTRAVENOUS | Status: AC
  Administered 2020-03-30: 150 mg via INTRAVENOUS
  Filled 2020-03-30: qty 150

## 2020-03-30 NOTE — Assessment & Plan Note (Signed)
12/09/2019:Screening mammogram showed a right breast asymmetry. Diagnostic mammogram showed a 2.9cm mass at the 9 o'clock position with 2 adjacent masses at the 10 and 8 o'clock positions, 0.9cm and 1.4cm respectively, and an enlarged lymph node, 1.3cm. 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 Neoadjuvant chemotherapy with TCH Perjeta 6 cycles followed by Herceptinand Perjeta versus Kadcylamaintenance for 1 year(also could be randomized compass HER 2 trial) 2. Followed by breast conserving surgery if possible with sentinel lymph node study 3. Followed by adjuvant radiation therapy if patient had lumpectomy 4.Followed by adjuvant antiestrogen therapy SWOG S 1714 neuropathy clinical trial: No adverse effects from participating in the trial 12/31/2019: Breast MRI: Known malignancy 3.1 cm, with non-mass enhancement total measuring 4.2 cm. Additional masses 6 mm and 3 and 4 mm --------------------------------------------------------------------------------------------------------------------------------------------- Current treatment:Cycle5TCH Perjeta Echocardiogram 12/30/2019: EF 50 to 55%  Chemo toxicities: 1.Nausea vomiting: Mild to moderate:Antiemetics have worked very well 2.bone pain due to Neulasta:Tylenol and Claritin are helping 3.Chemo induced anemia: Today's hemoglobin is 11.1 4.Alopecia 5.  Diarrhea due to Perjeta: Instructed her how to take Imodium   MRI guided biopsy on the 2 additional masses:Invasive lobular cancer with LCIS, retroareolar: LCIS grade 2 Return to clinic in3weeks for cycle6 Breast MRI will be ordered after cycle 6. Sent a message to [place] for surgical planning and TB presentation.

## 2020-03-30 NOTE — Progress Notes (Signed)
   Covid-19 Vaccination Clinic  Name:  Michelle Anthony    MRN: [id number] DOB: November 19, 1965  03/30/2020  [name] was observed post Covid-19 immunization for 15 minutes without incident. She was provided with Vaccine Information Sheet and instruction to access the V-Safe system.   [name] was instructed to call 911 with any severe reactions post vaccine: [name] Difficulty breathing  . Swelling of face and throat  . A fast heartbeat  . A bad rash all over body  . Dizziness and weakness   Immunizations Administered    Name Date Dose VIS Date [place] COVID-19 Vaccine 03/30/2020  2:01 PM 0.3 mL 02/03/2020 Intramuscular   Manufacturer: [employer]   Lot: [id number]   [place]: [id number]

## 2020-03-30 NOTE — Patient Instructions (Addendum)
Michelle Anthony Discharge Instructions for Patients Receiving Chemotherapy  Today you received the following chemotherapy agents: trastuzumab (Ogivri), pertuzumab (Perjeta), docetaxel (Taxotere), Carboplatin.  To help prevent nausea and vomiting after your treatment, we encourage you to take your nausea medication as directed.   If you develop nausea and vomiting that is not controlled by your nausea medication, call the clinic.   BELOW ARE SYMPTOMS THAT SHOULD BE REPORTED IMMEDIATELY:  *FEVER GREATER THAN 100.5 F  *CHILLS WITH OR WITHOUT FEVER  NAUSEA AND VOMITING THAT IS NOT CONTROLLED WITH YOUR NAUSEA MEDICATION  *UNUSUAL SHORTNESS OF BREATH  *UNUSUAL BRUISING OR BLEEDING  TENDERNESS IN MOUTH AND THROAT WITH OR WITHOUT PRESENCE OF ULCERS  *URINARY PROBLEMS  *BOWEL PROBLEMS  UNUSUAL RASH Items with * indicate a potential emergency and should be followed up as soon as possible.  Feel free to call the clinic should you have any questions or concerns. The clinic phone number is [phone].  Please show the [place] at check-in to the Emergency Department and triage nurse.

## 2020-03-30 NOTE — Research (Signed)
03/30/20 at 9:28am -S1714, A PROSPECTIVE OBSERVATIONAL COHORT STUDY TO DEVELOP A PREDICTIVE MODEL OFTAXANE-INDUCED PERIPHERAL NEUROPATHY IN CANCER PATIENTS. Week 12 Patient presented to the clinic with her daughter, for her5th chemo treatment. Patient's concomitant medications were reviewed with the pt.The pt reported nail changes.  The pt said that her nails are "a little sore".  The pt was encouraged to examine her feet and toes daily and report any worsening symptoms.   PROs:Questionnaireswereprovidedtothe patient after arrival to the [facility]. Collected questionnaires and checked for completeness and accuracy.  Labs:Optionalresearch labswerecollected,per protocol,alongwith other standard of care labs ordered by providerthis morning. Physician Assessments:CTCAE and Treatment Burden forms completed and signed by Dr. Lindi Adie.  The pt denies any signs/symptoms of neuropathy. Patient to proceed with cycle 5 treatment with no dose modifications. Assessment for Interventions for CIPN:Reviewed with patient andCRFs completed.  She reports only using Udder cream for dry skin to her hands and feet (just "1-3 times per week" per pt). She denies any other topical agents. Pt denies taking fish oil and glutamine.  The pt does report taking a multivitamin.  The pt provided the amounts of Vitamin E (15 mg), B6 (1.7 mg), and B12 (4.8 mcg) in her multivitamin which she takes 2 pills/gummies daily. The pt reports taking Tylenol (500 mg, <1 week per pt) for bone pain related to the injection following her chemo. She also reports taking ibuprofen (200 mg , <1 week per pt) for headaches.  The pt denies using any complementary or alternative treatments since her last visit.  Neuropen Assessment:Completed per protocol bythiscertified research RN,and recorded by Clabe Seal, Research Coordinator. [place] Assessment:Completed per protocol bythiscertified research RN,and recorded by Clabe Seal,  Research officer, political party. Plan:Informed patient ofweek 24 assessments scheduled to be done around 06/22/20.  Patient denied having any questions at this time.Patientwasthanked for her time and contribution to study and wasencouraged to call clinicforany questions or concernsshe may haveprior tohernextappointment. Brion Aliment RN, BSN, CCRP Clinical Research Nurse 03/30/2020 9:50 AM

## 2020-03-31 ENCOUNTER — Telehealth: Payer: Self-pay | Admitting: Hematology and Oncology

## 2020-03-31 NOTE — Telephone Encounter (Signed)
No 12/15 los, no changes made to pt schedule

## 2020-04-01 ENCOUNTER — Inpatient Hospital Stay: Payer: BC Managed Care – PPO

## 2020-04-01 ENCOUNTER — Other Ambulatory Visit: Payer: Self-pay

## 2020-04-01 VITALS — BP 120/76 | HR 70 | Resp 18

## 2020-04-01 DIAGNOSIS — Z170 Estrogen receptor positive status [ER+]: Secondary | ICD-10-CM

## 2020-04-01 DIAGNOSIS — C50411 Malignant neoplasm of upper-outer quadrant of right female breast: Secondary | ICD-10-CM

## 2020-04-01 MED ORDER — PEGFILGRASTIM-CBQV 6 MG/0.6ML ~~LOC~~ SOSY
6.0000 mg | PREFILLED_SYRINGE | Freq: Once | SUBCUTANEOUS | Status: AC
  Administered 2020-04-01: 6 mg via SUBCUTANEOUS

## 2020-04-01 MED ORDER — PEGFILGRASTIM-CBQV 6 MG/0.6ML ~~LOC~~ SOSY
PREFILLED_SYRINGE | SUBCUTANEOUS | Status: AC
  Filled 2020-04-01: qty 0.6

## 2020-04-01 NOTE — Patient Instructions (Signed)

## 2020-04-06 ENCOUNTER — Other Ambulatory Visit (HOSPITAL_COMMUNITY): Payer: BC Managed Care – PPO

## 2020-04-13 ENCOUNTER — Ambulatory Visit (HOSPITAL_COMMUNITY)
Admission: RE | Admit: 2020-04-13 | Discharge: 2020-04-13 | Disposition: A | Discharge status: Home / Self Care | Payer: BC Managed Care – PPO | Source: Ambulatory Visit | Attending: Hematology and Oncology | Admitting: Hematology and Oncology

## 2020-04-13 ENCOUNTER — Other Ambulatory Visit: Payer: Self-pay

## 2020-04-13 DIAGNOSIS — Z0189 Encounter for other specified special examinations: Secondary | ICD-10-CM | POA: Diagnosis not present

## 2020-04-13 DIAGNOSIS — C50411 Malignant neoplasm of upper-outer quadrant of right female breast: Secondary | ICD-10-CM | POA: Diagnosis not present

## 2020-04-13 DIAGNOSIS — Z170 Estrogen receptor positive status [ER+]: Secondary | ICD-10-CM | POA: Diagnosis not present

## 2020-04-13 DIAGNOSIS — Z01818 Encounter for other preprocedural examination: Secondary | ICD-10-CM | POA: Insufficient documentation

## 2020-04-13 LAB — ECHOCARDIOGRAM COMPLETE
Area-P 1/2: 3.08 cm2
S' Lateral: 3.4 cm

## 2020-04-13 NOTE — Progress Notes (Signed)
Echocardiogram 2D Echocardiogram has been performed.  Michelle Anthony 04/13/2020, 8:40 AM

## 2020-04-19 NOTE — Progress Notes (Signed)
 Patient Care Team: Briscoe, Kim K, MD as PCP - General (Family Medicine) Martini, Keisha N, RN as Oncology Nurse Navigator Stuart, Dawn C, RN as Oncology Nurse Navigator Cornett, Thomas, MD as Consulting Physician (General Surgery) Gudena, Vinay, MD as Consulting Physician (Hematology and Oncology) Kinard, James, MD as Consulting Physician (Radiation Oncology)  DIAGNOSIS:    ICD-10-CM   1. Malignant neoplasm of upper-outer quadrant of right breast in female, estrogen receptor positive (HCC)  C50.411    Z17.0     SUMMARY OF ONCOLOGIC HISTORY: Oncology History  Malignant neoplasm of upper-outer quadrant of right breast in female, estrogen receptor positive (HCC)  12/16/2019 Initial Diagnosis   Screening mammogram  showed a right breast asymmetry. Diagnostic mammogram showed a 2.9cm mass at the 9 o'clock position with 2 adjacent masses at the 10 and 8 o'clock positions, 0.9cm and 1.4cm respectively, and an enlarged lymph node, 1.3cm.  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 The report date is 12/31/2019.   The Multi-Cancer Panel offered by [facility] includes sequencing and/or deletion duplication testing of the following 85 genes: AIP, ALK, APC, ATM, AXIN2,BAP1,  BARD1, BLM, BMPR1A, BRCA1, BRCA2, BRIP1, CASR, CDC73, CDH1, CDK4, CDKN1B, CDKN1C, CDKN2A (p14ARF), CDKN2A (p16INK4a), CEBPA, CHEK2, CTNNA1, DICER1, DIS3L2, EGFR (c.2369C>T, p.Thr790Met variant only), EPCAM (Deletion/duplication testing only), FH, FLCN, GATA2, GPC3, GREM1 (Promoter region deletion/duplication testing only), HOXB13 (c.251G>A, p.Gly84Glu), HRAS, KIT, MAX, MEN1, MET, MITF (c.952G>A, p.Glu318Lys variant only), MLH1, MSH2, MSH3, MSH6, MUTYH, NBN, NF1,  NF2, NTHL1, PALB2, PDGFRA, PHOX2B, PMS2, POLD1, POLE, POT1, PRKAR1A, PTCH1, PTEN, RAD50, RAD51C, RAD51D, RB1, RECQL4, RET, RNF43, RUNX1, SDHAF2, SDHA (sequence changes only), SDHB, SDHC, SDHD, SMAD4, SMARCA4, SMARCB1, SMARCE1, STK11, SUFU, TERC, TERT, TMEM127, TP53, TSC1, TSC2, VHL, WRN and WT1.   01/06/2020 -  Chemotherapy   The patient had dexamethasone (DECADRON) 4 MG tablet, 4 mg (100 % of original dose 4 mg), Oral, Daily, 1 of 1 cycle, Start date: 12/23/2019, End date: -- Dose modification: 4 mg (original dose 4 mg, Cycle 0) palonosetron (ALOXI) injection 0.25 mg, 0.25 mg, Intravenous,  Once, 5 of 6 cycles Administration: 0.25 mg (01/06/2020), 0.25 mg (01/27/2020), 0.25 mg (02/17/2020), 0.25 mg (03/30/2020), 0.25 mg (03/09/2020) pegfilgrastim-cbqv (UDENYCA) injection 6 mg, 6 mg, Subcutaneous, Once, 5 of 6 cycles Administration: 6 mg (01/08/2020), 6 mg (01/29/2020), 6 mg (02/19/2020), 6 mg (03/11/2020), 6 mg (04/01/2020) CARBOplatin (PARAPLATIN) 540 mg in sodium chloride 0.9 % 250 mL chemo infusion, 540 mg (100 % of original dose 541 mg), Intravenous,  Once, 5 of 6 cycles Dose modification:   (original dose 541 mg, Cycle 1) Administration: 540 mg (01/06/2020), 540 mg (01/27/2020), 540 mg (02/17/2020), 540 mg (03/30/2020), 540 mg (03/09/2020) DOCEtaxel (TAXOTERE) 130 mg in sodium chloride 0.9 % 250 mL chemo infusion, 75 mg/m2 = 130 mg, Intravenous,  Once, 5 of 6 cycles Administration: 130 mg (01/06/2020), 130 mg (01/27/2020), 130 mg (02/17/2020), 130 mg (03/30/2020), 130 mg (03/09/2020) fosaprepitant (EMEND) 150 mg in sodium chloride 0.9 % 145 mL IVPB, 150 mg, Intravenous,  Once, 5 of 6 cycles Administration: 150 mg (01/06/2020), 150 mg (01/27/2020), 150 mg (02/17/2020), 150 mg (03/30/2020), 150 mg (03/09/2020) pertuzumab (PERJETA) 420 mg in sodium chloride 0.9 % 250 mL chemo infusion, 420 mg (  100 % of original dose 420 mg), Intravenous, Once, 5 of 6 cycles Dose modification: 420 mg (original dose 420 mg,  Cycle 1, Reason: Provider Judgment) Administration: 420 mg (01/06/2020), 420 mg (01/27/2020), 420 mg (02/17/2020), 420 mg (03/30/2020), 420 mg (03/09/2020) trastuzumab-dkst (OGIVRI) 525 mg in sodium chloride 0.9 % 250 mL chemo infusion, 8 mg/kg = 525 mg, Intravenous,  Once, 5 of 6 cycles Administration: 525 mg (01/06/2020), 399 mg (01/27/2020), 399 mg (02/17/2020), 399 mg (03/30/2020), 399 mg (03/09/2020)  for chemotherapy treatment.      CHIEF COMPLIANT: Cycle6TCH Perjeta  INTERVAL HISTORY: Michelle Anthony is a 55 y.o. with above-mentioned history of right breast cancer currently on neoadjuvant chemotherapy with [place].She presents to the clinic todayfora toxicity checkandcycle6.    She has started to notice peripheral neuropathy on the bottom of her toes in both her feet.  It is not unbearable.  She feels like numbness and tingling in the toes.  She always had some tenderness on the nailbeds but this is definitely neuropathy in her feet.  There is no neuropathy in her hands.  Nausea is well controlled with the Compazine.  ALLERGIES:  has No Known Allergies.  MEDICATIONS:  Current Outpatient Medications  Medication Sig Dispense Refill  . COLLAGEN PO Apply topically daily.    [name] dexamethasone (DECADRON) 4 MG tablet Take 1 tablet (4 mg total) by mouth daily. Take 1 tablet day before chemo and 1 tablet 1 day after chemo with food 12 tablet 0  . ibuprofen (ADVIL) 800 MG tablet Take 1 tablet (800 mg total) by mouth every 8 (eight) hours as needed. 30 tablet 0  . lidocaine-prilocaine (EMLA) cream Apply to affected area once 30 g 3  . LORazepam (ATIVAN) 0.5 MG tablet Take 1 tablet (0.5 mg total) by mouth every 6 (six) hours as needed for sleep. 30 tablet 0  . Multiple Vitamin (MULTIVITAMIN) tablet Take 1 tablet by mouth daily.    . ondansetron (ZOFRAN) 8 MG tablet Take 1 tablet (8 mg total) by mouth 2 (two) times daily as needed (Nausea or vomiting). Start on the third day after  chemotherapy. 30 tablet 1  . prochlorperazine (COMPAZINE) 10 MG tablet Take 1 tablet (10 mg total) by mouth every 6 (six) hours as needed (Nausea or vomiting). 60 tablet 1  . Tretinoin (RETIN-A EX) Apply topically.     No current facility-administered medications for this visit.    PHYSICAL EXAMINATION: ECOG PERFORMANCE STATUS: 1 - Symptomatic but completely ambulatory  Vitals:   04/20/20 0936  BP: 114/68  Pulse: 84  Resp: 17  Temp: (!) 97.4 F (36.3 C)  SpO2: 99%   Filed Weights   04/20/20 0936  Weight: 142 lb 8 oz (64.6 kg)    LABORATORY DATA:  I have reviewed the data as listed CMP Latest Ref Rng & Units 03/30/2020 03/09/2020 02/17/2020  Glucose 70 - 99 mg/dL 102(H) 103(H) 108(H)  BUN 6 - 20 mg/dL _0 Creatinine 0.44 - 1.00 mg/dL 0.66 0.68 0.65  Sodium 135 - 145 mmol/L 140 141 141  Potassium 3.5 - 5.1 mmol/L 3.9 3.9 3.7  Chloride 98 - 111 mmol/L 106 105 107  CO2 22 - 32 mmol/L _1 Calcium 8.9 - 10.3 mg/dL 9.8 9.4 9.3  Total Protein 6.5 - 8.1 g/dL 6.8 6.8 6.8  Total Bilirubin 0.3 - 1.2 mg/dL 0.5 0.4 0.5  Alkaline Phos 38 - 126 U/L 72 80 74  AST 15 - 41 U/L 21  17 19  ALT 0 - 44 U/L 32 25 30    Lab Results  Component Value Date   WBC 7.6 04/20/2020   HGB 11.3 (L) 04/20/2020   HCT 34.3 (L) 04/20/2020   MCV 99.7 04/20/2020   PLT 240 04/20/2020   NEUTROABS 5.4 04/20/2020    ASSESSMENT & PLAN:  Malignant neoplasm of upper-outer quadrant of right breast in female, estrogen receptor positive (HCC) 12/09/2019:Screening mammogram showed a right breast asymmetry. Diagnostic mammogram showed a 2.9cm mass at the 9 o'clock position with 2 adjacent masses at the 10 and 8 o'clock positions, 0.9cm and 1.4cm respectively, and an enlarged lymph node, 1.3cm. 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 Neoadjuvant  chemotherapy with TCH Perjeta 6 cycles followed by Herceptinand Perjeta versus Kadcylamaintenance for 1 year(also could be randomized compass HER 2 trial) 2. Followed by breast conserving surgery if possible with sentinel lymph node study 3. Followed by adjuvant radiation therapy if patient had lumpectomy 4.Followed by adjuvant antiestrogen therapy SWOG S 1714 neuropathy clinical trial: No adverse effects from participating in the trial 12/31/2019: Breast MRI: Known malignancy 3.1 cm, with non-mass enhancement total measuring 4.2 cm. Additional masses 6 mm and 3 and 4 mm MRI guided biopsy on the 2 additional masses:Invasive lobular cancer with LCIS, retroareolar: LCIS grade 2 --------------------------------------------------------------------------------------------------------------------------------------------- Current treatment:Cycle6TCH Perjeta Echocardiogram 12/30/2019: EF 50 to 55%  Chemo toxicities: 1.Nausea vomiting: Mild to moderate:Taking Compazine 2.bone pain due to Neulasta:Tylenol and Claritin are helping 3.Chemo induced anemia: Today's hemoglobin is  11.3 4.Alopecia 5.Diarrhea due to Perjeta: Improved with Imodium  6.  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  We reduce the dose of Taxotere to 65 mg meter squared today.  Breast MRI scheduled for 04/24/2020 Has appt with Dr.Cornett for surgical planning and TB presentation. Return to clinic 05/11/2020 for Herceptin and Perjeta   No orders of the defined types were placed in this encounter.  The patient has a good understanding of the overall plan. she agrees with it. she will call with any problems that may develop before the next visit here.  Total time spent: 30 mins including face to face time and time spent for planning, charting and coordination of care  Gudena, Vinay, MD 04/20/2020  I, Molly Dorshimer, am acting as scribe for Dr. Vinay Gudena.  I have reviewed the above  documentation for accuracy and completeness, and I agree with the above.       

## 2020-04-20 ENCOUNTER — Other Ambulatory Visit: Payer: Self-pay

## 2020-04-20 ENCOUNTER — Inpatient Hospital Stay: Payer: BC Managed Care – PPO

## 2020-04-20 ENCOUNTER — Inpatient Hospital Stay: Payer: BC Managed Care – PPO | Admitting: Hematology and Oncology

## 2020-04-20 ENCOUNTER — Inpatient Hospital Stay: Payer: BC Managed Care – PPO | Attending: Hematology and Oncology

## 2020-04-20 DIAGNOSIS — D6481 Anemia due to antineoplastic chemotherapy: Secondary | ICD-10-CM | POA: Insufficient documentation

## 2020-04-20 DIAGNOSIS — G620 Drug-induced polyneuropathy: Secondary | ICD-10-CM | POA: Insufficient documentation

## 2020-04-20 DIAGNOSIS — Z79899 Other long term (current) drug therapy: Secondary | ICD-10-CM | POA: Diagnosis not present

## 2020-04-20 DIAGNOSIS — R197 Diarrhea, unspecified: Secondary | ICD-10-CM | POA: Insufficient documentation

## 2020-04-20 DIAGNOSIS — Z170 Estrogen receptor positive status [ER+]: Secondary | ICD-10-CM

## 2020-04-20 DIAGNOSIS — R112 Nausea with vomiting, unspecified: Secondary | ICD-10-CM | POA: Insufficient documentation

## 2020-04-20 DIAGNOSIS — Z95828 Presence of other vascular implants and grafts: Secondary | ICD-10-CM

## 2020-04-20 DIAGNOSIS — T451X5A Adverse effect of antineoplastic and immunosuppressive drugs, initial encounter: Secondary | ICD-10-CM | POA: Diagnosis not present

## 2020-04-20 DIAGNOSIS — C50411 Malignant neoplasm of upper-outer quadrant of right female breast: Secondary | ICD-10-CM | POA: Insufficient documentation

## 2020-04-20 LAB — CMP (CANCER CENTER ONLY)
ALT: 35 U/L (ref 0–44)
AST: 19 U/L (ref 15–41)
Albumin: 4 g/dL (ref 3.5–5.0)
Alkaline Phosphatase: 68 U/L (ref 38–126)
Anion gap: 8 (ref 5–15)
BUN: 14 mg/dL (ref 6–20)
CO2: 26 mmol/L (ref 22–32)
Calcium: 9.5 mg/dL (ref 8.9–10.3)
Chloride: 107 mmol/L (ref 98–111)
Creatinine: 0.67 mg/dL (ref 0.44–1.00)
GFR, Estimated: >60 mL/min (ref >60)
Glucose, Bld: 102 mg/dL — ABNORMAL HIGH (ref 70–99)
Potassium: 3.9 mmol/L (ref 3.5–5.1)
Sodium: 141 mmol/L (ref 135–145)
Total Bilirubin: 0.4 mg/dL (ref 0.3–1.2)
Total Protein: 7 g/dL (ref 6.5–8.1)

## 2020-04-20 LAB — CBC WITH DIFFERENTIAL (CANCER CENTER ONLY)
Abs Immature Granulocytes: 0.02 10*3/uL (ref 0.00–0.07)
Basophils Absolute: 0 10*3/uL (ref 0.0–0.1)
Basophils Relative: 1 %
Eosinophils Absolute: 0 10*3/uL (ref 0.0–0.5)
Eosinophils Relative: 0 %
HCT: 34.3 % — ABNORMAL LOW (ref 36.0–46.0)
Hemoglobin: 11.3 g/dL — ABNORMAL LOW (ref 12.0–15.0)
Immature Granulocytes: 0 %
Lymphocytes Relative: 20 %
Lymphs Abs: 1.5 10*3/uL (ref 0.7–4.0)
MCH: 32.8 pg (ref 26.0–34.0)
MCHC: 32.9 g/dL (ref 30.0–36.0)
MCV: 99.7 fL (ref 80.0–100.0)
Monocytes Absolute: 0.6 10*3/uL (ref 0.1–1.0)
Monocytes Relative: 8 %
Neutro Abs: 5.4 10*3/uL (ref 1.7–7.7)
Neutrophils Relative %: 71 %
Platelet Count: 240 10*3/uL (ref 150–400)
RBC: 3.44 MIL/uL — ABNORMAL LOW (ref 3.87–5.11)
RDW: 15.2 % (ref 11.5–15.5)
WBC Count: 7.6 10*3/uL (ref 4.0–10.5)
nRBC: 0 % (ref 0.0–0.2)

## 2020-04-20 MED ORDER — SODIUM CHLORIDE 0.9% FLUSH
10.0000 mL | INTRAVENOUS | Status: DC | PRN
  Administered 2020-04-20: 10 mL
  Filled 2020-04-20: qty 10

## 2020-04-20 MED ORDER — HEPARIN SOD (PORK) LOCK FLUSH 100 UNIT/ML IV SOLN
500.0000 [IU] | Freq: Once | INTRAVENOUS | Status: AC
  Administered 2020-04-20: 500 [IU]
  Filled 2020-04-20: qty 5

## 2020-04-20 MED ORDER — HEPARIN SOD (PORK) LOCK FLUSH 100 UNIT/ML IV SOLN
500.0000 [IU] | Freq: Once | INTRAVENOUS | Status: DC | PRN
  Filled 2020-04-20: qty 5

## 2020-04-20 MED ORDER — PALONOSETRON HCL INJECTION 0.25 MG/5ML
INTRAVENOUS | Status: AC
  Filled 2020-04-20: qty 5

## 2020-04-20 MED ORDER — SODIUM CHLORIDE 0.9 % IV SOLN
420.0000 mg | Freq: Once | INTRAVENOUS | Status: AC
  Administered 2020-04-20: 420 mg via INTRAVENOUS
  Filled 2020-04-20: qty 14

## 2020-04-20 MED ORDER — ACETAMINOPHEN 325 MG PO TABS
ORAL_TABLET | ORAL | Status: AC
  Filled 2020-04-20: qty 2

## 2020-04-20 MED ORDER — SODIUM CHLORIDE 0.9 % IV SOLN
Freq: Once | INTRAVENOUS | Status: AC
  Filled 2020-04-20: qty 250

## 2020-04-20 MED ORDER — PALONOSETRON HCL INJECTION 0.25 MG/5ML
0.2500 mg | Freq: Once | INTRAVENOUS | Status: AC
  Administered 2020-04-20: 0.25 mg via INTRAVENOUS

## 2020-04-20 MED ORDER — SODIUM CHLORIDE 0.9 % IV SOLN
150.0000 mg | Freq: Once | INTRAVENOUS | Status: AC
  Administered 2020-04-20: 150 mg via INTRAVENOUS
  Filled 2020-04-20: qty 150

## 2020-04-20 MED ORDER — SODIUM CHLORIDE 0.9 % IV SOLN
536.0000 mg | Freq: Once | INTRAVENOUS | Status: AC
  Administered 2020-04-20: 540 mg via INTRAVENOUS
  Filled 2020-04-20: qty 54

## 2020-04-20 MED ORDER — SODIUM CHLORIDE 0.9 % IV SOLN
10.0000 mg | Freq: Once | INTRAVENOUS | Status: AC
  Administered 2020-04-20: 10 mg via INTRAVENOUS
  Filled 2020-04-20: qty 10

## 2020-04-20 MED ORDER — DIPHENHYDRAMINE HCL 25 MG PO CAPS
50.0000 mg | ORAL_CAPSULE | Freq: Once | ORAL | Status: AC
  Administered 2020-04-20: 50 mg via ORAL

## 2020-04-20 MED ORDER — SODIUM CHLORIDE 0.9% FLUSH
10.0000 mL | Freq: Once | INTRAVENOUS | Status: AC
  Administered 2020-04-20: 10 mL
  Filled 2020-04-20: qty 10

## 2020-04-20 MED ORDER — ACETAMINOPHEN 325 MG PO TABS
650.0000 mg | ORAL_TABLET | Freq: Once | ORAL | Status: AC
  Administered 2020-04-20: 650 mg via ORAL

## 2020-04-20 MED ORDER — SODIUM CHLORIDE 0.9 % IV SOLN
65.0000 mg/m2 | Freq: Once | INTRAVENOUS | Status: AC
  Administered 2020-04-20: 110 mg via INTRAVENOUS
  Filled 2020-04-20: qty 11

## 2020-04-20 MED ORDER — DIPHENHYDRAMINE HCL 25 MG PO CAPS
ORAL_CAPSULE | ORAL | Status: AC
  Filled 2020-04-20: qty 2

## 2020-04-20 MED ORDER — TRASTUZUMAB-DKST CHEMO 150 MG IV SOLR
6.0000 mg/kg | Freq: Once | INTRAVENOUS | Status: AC
  Administered 2020-04-20: 399 mg via INTRAVENOUS
  Filled 2020-04-20: qty 19

## 2020-04-20 NOTE — Assessment & Plan Note (Signed)
12/09/2019:Screening mammogram showed a right breast asymmetry. Diagnostic mammogram showed a 2.9cm mass at the 9 o'clock position with 2 adjacent masses at the 10 and 8 o'clock positions, 0.9cm and 1.4cm respectively, and an enlarged lymph node, 1.3cm. 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 Neoadjuvant chemotherapy with TCH Perjeta 6 cycles followed by Herceptinand Perjeta versus Kadcylamaintenance for 1 year(also could be randomized compass HER 2 trial) 2. Followed by breast conserving surgery if possible with sentinel lymph node study 3. Followed by adjuvant radiation therapy if patient had lumpectomy 4.Followed by adjuvant antiestrogen therapy SWOG S 1714 neuropathy clinical trial: No adverse effects from participating in the trial 12/31/2019: Breast MRI: Known malignancy 3.1 cm, with non-mass enhancement total measuring 4.2 cm. Additional masses 6 mm and 3 and 4 mm MRI guided biopsy on the 2 additional masses:Invasive lobular cancer with LCIS, retroareolar: LCIS grade 2 --------------------------------------------------------------------------------------------------------------------------------------------- Current treatment:Cycle6TCH Perjeta Echocardiogram 12/30/2019: EF 50 to 55%  Chemo toxicities: 1.Nausea vomiting: Mild to moderate:Antiemetics have worked very well 2.bone pain due to Neulasta:Tylenol and Claritin are helping 3.Chemo induced anemia: Today's hemoglobin is 10.9 4.Alopecia 5.Diarrhea due to Perjeta: Improved with Imodium    Breast MRI 04/24/2020 Has appt with Dr.Cornett for surgical planning and TB presentation.

## 2020-04-20 NOTE — Patient Instructions (Signed)
[facility] Discharge Instructions for Patients Receiving Chemotherapy  Today you received the following chemotherapy agents: trastuzumab (Ogivri), pertuzumab (Perjeta), docetaxel (Taxotere), Carboplatin.  To help prevent nausea and vomiting after your treatment, we encourage you to take your nausea medication as directed.   If you develop nausea and vomiting that is not controlled by your nausea medication, call the clinic.   BELOW ARE SYMPTOMS THAT SHOULD BE REPORTED IMMEDIATELY:  *FEVER GREATER THAN 100.5 F  *CHILLS WITH OR WITHOUT FEVER  NAUSEA AND VOMITING THAT IS NOT CONTROLLED WITH YOUR NAUSEA MEDICATION  *UNUSUAL SHORTNESS OF BREATH  *UNUSUAL BRUISING OR BLEEDING  TENDERNESS IN MOUTH AND THROAT WITH OR WITHOUT PRESENCE OF ULCERS  *URINARY PROBLEMS  *BOWEL PROBLEMS  UNUSUAL RASH Items with * indicate a potential emergency and should be followed up as soon as possible.  Feel free to call the clinic should you have any questions or concerns. The clinic phone number is [phone].  Please show the CHEMO ALERT CARD at check-in to the Emergency Department and triage nurse.   

## 2020-04-22 ENCOUNTER — Encounter: Payer: Self-pay | Admitting: Hematology and Oncology

## 2020-04-22 ENCOUNTER — Telehealth: Payer: Self-pay | Admitting: Hematology and Oncology

## 2020-04-22 ENCOUNTER — Inpatient Hospital Stay: Payer: BC Managed Care – PPO

## 2020-04-22 ENCOUNTER — Other Ambulatory Visit: Payer: Self-pay

## 2020-04-22 VITALS — BP 118/72 | HR 88 | Resp 16

## 2020-04-22 DIAGNOSIS — Z170 Estrogen receptor positive status [ER+]: Secondary | ICD-10-CM

## 2020-04-22 DIAGNOSIS — C50411 Malignant neoplasm of upper-outer quadrant of right female breast: Secondary | ICD-10-CM

## 2020-04-22 MED ORDER — PEGFILGRASTIM-CBQV 6 MG/0.6ML ~~LOC~~ SOSY
6.0000 mg | PREFILLED_SYRINGE | Freq: Once | SUBCUTANEOUS | Status: AC
  Administered 2020-04-22: 6 mg via SUBCUTANEOUS

## 2020-04-22 MED ORDER — PEGFILGRASTIM-CBQV 6 MG/0.6ML ~~LOC~~ SOSY
PREFILLED_SYRINGE | SUBCUTANEOUS | Status: AC
  Filled 2020-04-22: qty 0.6

## 2020-04-22 NOTE — Telephone Encounter (Signed)
Scheduled per 1/5 los. Pt will receive an updated appt calendar per next visit appt notes  

## 2020-04-24 ENCOUNTER — Ambulatory Visit
Admission: RE | Admit: 2020-04-24 | Discharge: 2020-04-24 | Disposition: A | Discharge status: Home / Self Care | Payer: BC Managed Care – PPO | Source: Ambulatory Visit | Attending: Hematology and Oncology | Admitting: Hematology and Oncology

## 2020-04-24 ENCOUNTER — Other Ambulatory Visit: Payer: Self-pay

## 2020-04-24 DIAGNOSIS — Z170 Estrogen receptor positive status [ER+]: Secondary | ICD-10-CM

## 2020-04-24 DIAGNOSIS — C50411 Malignant neoplasm of upper-outer quadrant of right female breast: Secondary | ICD-10-CM

## 2020-04-24 MED ORDER — GADOBUTROL 1 MMOL/ML IV SOLN
6.0000 mL | Freq: Once | INTRAVENOUS | Status: AC | PRN
  Administered 2020-04-24: 6 mL via INTRAVENOUS

## 2020-04-25 ENCOUNTER — Encounter: Payer: Self-pay | Admitting: *Deleted

## 2020-04-26 ENCOUNTER — Ambulatory Visit: Payer: Self-pay | Admitting: Surgery

## 2020-04-26 DIAGNOSIS — C50911 Malignant neoplasm of unspecified site of right female breast: Secondary | ICD-10-CM

## 2020-04-26 NOTE — H&P (View-Only) (Signed)
Michelle Anthony Appointment: 04/26/2020 11:20 AM Location: [facility] Patient #: [id number] DOB: Jan 07, 1966 Divorced / Language: Michelle Anthony / Race: White Female  History of Present Illness [name] A. Niajah Sipos MD; 04/26/2020 12:38 PM) Patient words: Patient returns for follow-up after receiving the measuring chemotherapy starting in September 2021 for stage II right breast invasive lobular carcinoma. She has received chemotherapy and treatment for her 2 new-positive disease. She has had next response with magnetic resonance imaging being performed which shows significant reduction disease down to about 6 mm from the initial area of roughly 4.1 cm. She is in good spirits and tolerated chemotherapy relatively well with minimal peripheral neuropathy she states.          CLINICAL DATA: 55 year old female presenting for MRI to determine response to neoadjuvant chemotherapy. 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  LABS: Creatinine of 0.67 mg/dL and GFR of greater than 60 on 04/20/2020.  EXAM: BILATERAL BREAST MRI WITH AND WITHOUT CONTRAST  TECHNIQUE: Multiplanar, multisequence MR images of both breasts were obtained prior to and following the intravenous administration of 6 ml of Gadavist  Three-dimensional MR images were rendered by post-processing of the original MR data on an independent workstation. The three-dimensional MR images were interpreted, and findings are reported in the following complete MRI report for this study. Three dimensional images were evaluated at the independent interpreting workstation using the DynaCAD thin client.  COMPARISON: Previous exam(s).  FINDINGS: Breast composition: c. Heterogeneous fibroglandular tissue.  Background parenchymal enhancement: Minimal  Right breast:  In the posterior slightly lateral right breast, at the site of the dominant mass  previously seen on the MRI from 12/30/2019, there is a residual 0.6 cm mass (previously measuring 3.1 x 1.8 x 2.9 cm). There are 6-7 punctate satellite lesions, where previously there were innumerable larger/more prominent satellite lesions surrounding this mass. In total, the residual mass and satellite lesions span approximately 2.5 cm in the anterior to posterior plane, previously spanning 4.2 cm  The previously described 0.6 cm mass lateral to the primary mass, which was separated by 2 cm is again seen, and is unchanged in size but faintly enhancing (series 6, image 85).  The smaller masses previously seen in the anterior retroareolar right breast are not visualized on this exam.  Left breast:  No mass or abnormal enhancement.  Lymph nodes:  No abnormal appearing lymph nodes.  Ancillary findings:  None.  IMPRESSION: 1. Marked improvement in the extent of abnormal enhancement in the right breast in the setting of neoadjuvant chemotherapy. The residual enhancement of the dominant mass measures 0.6 cm as compared with 3.1 cm on the prior MRI.  2. No evidence of left breast malignancy.  RECOMMENDATION: Continue treatment plan.  BI-RADS CATEGORY 6: Known biopsy-proven malignancy.   Electronically Signed By: Ammie Ferrier M.D. On: 04/25/2020 13:40   PER ONCOLOGY  ASSESSMENT & PLAN: Malignant neoplasm of upper-outer quadrant of right breast in female, estrogen receptor positive ([place]) 12/09/2019:Screening mammogram showed a right breast asymmetry. Diagnostic mammogram showed a 2.9cm mass at the 9 o'clock position with 2 adjacent masses at the 10 and 8 o'clock positions, 0.9cm and 1.4cm respectively, and an enlarged lymph node, 1.3cm. 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 Neoadjuvant chemotherapy with TCH Perjeta  6 cycles followed by  Herceptin and Perjeta versus Kadcyla maintenance for 1 year (also could be randomized compass HER 2 trial) 2. Followed by breast conserving surgery if possible with sentinel lymph node study 3. Followed by adjuvant radiation therapy if patient had lumpectomy 4. Followed by adjuvant antiestrogen therapy SWOG S 1714 neuropathy clinical trial: No adverse effects from participating in the trial 12/31/2019: Breast MRI: Known malignancy 3.1 cm, with non-mass enhancement total measuring 4.2 cm. Additional masses 6 mm and 3 and 4 mm MRI guided biopsy on the 2 additional masses: Invasive lobular cancer with LCIS, retroareolar: LCIS grade 2.  The patient is a 55 year old female.   Allergies Lindwood Coke, RN; 04/26/2020 11:15 AM) No Known Allergies [12/22/2019]: Allergies Reconciled  Medication History (Diane Herrin, RN; 04/26/2020 11:16 AM) Collagen (500MG Capsule, Oral) Active. Retin-A (0.01% Gel, External) Active. Multiple Vitamin (1 (one) Oral) Active. LORazepam (0.5MG Tablet, Oral) Active. Ondansetron HCl (8MG Tablet, Oral) Active. Prochlorperazine Maleate (10MG Tablet, Oral) Active. Medications Reconciled     Physical Exam (Tobi Groesbeck A. Brittane Grudzinski MD; 04/26/2020 12:38 PM)  Head and Neck Head-normocephalic, atraumatic with no lesions or palpable masses. Trachea-midline. Thyroid Gland Characteristics - normal size and consistency.  Breast Breast - Left-Symmetric, Non Tender, No Biopsy scars, no Dimpling - Left, No Inflammation, No Lumpectomy scars, No Mastectomy scars, No Peau d' Orange. Breast - Right-Symmetric, Non Tender, No Biopsy scars, no Dimpling - Right, No Inflammation, No Lumpectomy scars, No Mastectomy scars, No Peau d' Orange. Breast Lump-No Palpable Breast Mass.  Neurologic Neurologic evaluation reveals -alert and oriented x 3 with no impairment of recent or remote memory. Mental Status-Normal.  Lymphatic Head & Neck  General  Head & Neck Lymphatics: Bilateral - Description - Normal. Axillary  General Axillary Region: Bilateral - Description - Normal. Tenderness - Non Tender.    Assessment & Plan (Raahi Korber A. Terrence Pizana MD; 04/26/2020 12:41 PM)  BREAST CANCER, STAGE 2, RIGHT (C50.911) Impression: excellent response  Patient desires breast conserving surgery which are fills possible with the use of multiple seeds. Discussed right breast seed lumpectomy as well as right axillary sentinel lymph node mapping.  Risk of lumpectomy include bleeding, infection, seroma, more surgery, use of seed/wire, wound care, cosmetic deformity and the need for other treatments, death , blood clots, death. Pt agrees to proceed. Risk of sentinel lymph node mapping include bleeding, infection, lymphedema, shoulder pain. stiffness, dye allergy. cosmetic deformity , blood clots, death, need for more surgery. Pt agrees to proceed.   will need multiple seeds TOTAL TIME 45 MINUTES  Risk of lumpectomy include bleeding, infection, seroma, more surgery, use of seed/wire, wound care, cosmetic deformity and the need for other treatments, death , blood clots, death. Pt agrees to proceed. Risk of sentinel lymph node mapping include bleeding, infection, lymphedema, shoulder pain. stiffness, dye allergy. cosmetic deformity , blood clots, death, need for more surgery. Pt agrees to proceed. .   Total time 45 minutes discussing surgery, complications, reviewing magnetic resonance imaging, reviewing results and discussing care as well as face-to-face time.  Current Plans You are being scheduled for surgery- Our schedulers will call you.  You should hear from our office's scheduling department within 5 working days about the location, date, and time of surgery. We try to make accommodations for patient's preferences in scheduling surgery, but sometimes the OR schedule or the surgeon's schedule prevents [place] from making those accommodations.  If you have  not heard from our office [phone]) in 5 working days, call the office and ask for your  surgeon's nurse.  If you have other questions about your diagnosis, plan, or surgery, call the office and ask for your surgeon's nurse.  Pt Education - [facility] Breast Cancer Information Given - [facility] "Breast Journey" Package We discussed the staging and pathophysiology of breast cancer. We discussed all of the different options for treatment for breast cancer including surgery, chemotherapy, radiation therapy, Herceptin, and antiestrogen therapy. We discussed a sentinel lymph node biopsy as she does not appear to having lymph node involvement right now. We discussed the performance of that with injection of radioactive tracer and blue dye. We discussed that she would have an incision underneath her axillary hairline. We discussed that there is a bout a 10-20% chance of having a positive node with a sentinel lymph node biopsy and we will await the permanent pathology to make any other first further decisions in terms of her treatment. One of these options might be to return to the operating room to perform an axillary lymph node dissection. We discussed about a 1-2% risk lifetime of chronic shoulder pain as well as lymphedema associated with a sentinel lymph node biopsy. We discussed the options for treatment of the breast cancer which included lumpectomy versus a mastectomy. We discussed the performance of the lumpectomy with a wire placement. We discussed a 10-20% chance of a positive margin requiring reexcision in the operating room. We also discussed that she may need radiation therapy or antiestrogen therapy or both if she undergoes lumpectomy. We discussed the mastectomy and the postoperative care for that as well. We discussed that there is no difference in her survival whether she undergoes lumpectomy with radiation therapy or antiestrogen therapy versus a mastectomy. There is a slight difference in the local  recurrence rate being 3-5% with lumpectomy and about 1% with a mastectomy. We discussed the risks of operation including bleeding, infection, possible reoperation. She understands her further therapy will be based on what her stages at the time of her operation.  Pt Education - [facility] breast cancer surgery: discussed with patient and provided information. Pt Education - Pamphlet Given - Breast Biopsy: discussed with patient and provided information. Pt Education - ABC (After Breast Cancer) Class Info: discussed with patient and provided information.

## 2020-04-26 NOTE — H&P (Signed)
Michelle Anthony Appointment: 04/26/2020 11:20 AM Location: [facility] Patient #: [id number] DOB: [birth year] Divorced / Language: Michelle Anthony / Race: White Female  History of Present Illness [name] A. 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 She has received chemotherapy and treatment for her 2 new-positive disease. She has had next response with magnetic resonance imaging being performed which shows significant reduction disease down to about 6 mm from the initial area of roughly 4.1 cm. She is in good spirits and tolerated chemotherapy relatively well with minimal peripheral neuropathy she states.          CLINICAL DATA: 55 year old female presenting for MRI to determine response to neoadjuvant chemotherapy. 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  LABS: Creatinine of 0.67 mg/dL and GFR of greater than 60 on 04/20/2020.  EXAM: BILATERAL BREAST MRI WITH AND WITHOUT CONTRAST  TECHNIQUE: Multiplanar, multisequence MR images of both breasts were obtained prior to and following the intravenous administration of 6 ml of Gadavist  Three-dimensional MR images were rendered by post-processing of the original MR data on an independent workstation. The three-dimensional MR images were interpreted, and findings are reported in the following complete MRI report for this study. Three dimensional images were evaluated at the independent interpreting workstation using the DynaCAD thin client.  COMPARISON: Previous exam(s).  FINDINGS: Breast composition: c. Heterogeneous fibroglandular tissue.  Background parenchymal enhancement: Minimal  Right breast:  In the posterior slightly lateral right breast, at the site of the dominant mass  previously seen on the MRI from 12/30/2019, there is a residual 0.6 cm mass (previously measuring 3.1 x 1.8 x 2.9 cm). There are 6-7 punctate satellite lesions, where previously there were innumerable larger/more prominent satellite lesions surrounding this mass. In total, the residual mass and satellite lesions span approximately 2.5 cm in the anterior to posterior plane, previously spanning 4.2 cm  The previously described 0.6 cm mass lateral to the primary mass, which was separated by 2 cm is again seen, and is unchanged in size but faintly enhancing (series 6, image 85).  The smaller masses previously seen in the anterior retroareolar right breast are not visualized on this exam.  Left breast:  No mass or abnormal enhancement.  Lymph nodes:  No abnormal appearing lymph nodes.  Ancillary findings:  None.  IMPRESSION: 1. Marked improvement in the extent of abnormal enhancement in the right breast in the setting of neoadjuvant chemotherapy. The residual enhancement of the dominant mass measures 0.6 cm as compared with 3.1 cm on the prior MRI.  2. No evidence of left breast malignancy.  RECOMMENDATION: Continue treatment plan.  BI-RADS CATEGORY 6: Known biopsy-proven malignancy.   Electronically Signed By: Michelle Anthony M.D. On: 04/25/2020 13:40   PER ONCOLOGY  ASSESSMENT & PLAN: Malignant neoplasm of upper-outer quadrant of right breast in female, estrogen receptor positive ([place]) 12/09/2019:Screening mammogram showed a right breast asymmetry. Diagnostic mammogram showed a 2.9cm mass at the 9 o'clock position with 2 adjacent masses at the 10 and 8 o'clock positions, 0.9cm and 1.4cm respectively, and an enlarged lymph node, 1.3cm. 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 Neoadjuvant chemotherapy with TCH Perjeta  6 cycles followed by  Herceptin and Perjeta versus Kadcyla maintenance for 1 year (also could be randomized compass HER 2 trial) 2. Followed by breast conserving surgery if possible with sentinel lymph node study 3. Followed by adjuvant radiation therapy if patient had lumpectomy 4. Followed by adjuvant antiestrogen therapy SWOG S 1714 neuropathy clinical trial: No adverse effects from participating in the trial 12/31/2019: Breast MRI: Known malignancy 3.1 cm, with non-mass enhancement total measuring 4.2 cm. Additional masses 6 mm and 3 and 4 mm MRI guided biopsy on the 2 additional masses: Invasive lobular cancer with LCIS, retroareolar: LCIS grade 2.  The patient is a 55 year old female.   Allergies Michelle Coke, RN; 04/26/2020 11:15 AM) No Known Allergies [12/22/2019]: Allergies Reconciled  Medication History (Michelle Herrin, RN; 04/26/2020 11:16 AM) Collagen (500MG Capsule, Oral) Active. Retin-A (0.01% Gel, External) Active. Multiple Vitamin (1 (one) Oral) Active. LORazepam (0.5MG Tablet, Oral) Active. Ondansetron HCl (8MG Tablet, Oral) Active. Prochlorperazine Maleate (10MG Tablet, Oral) Active. Medications Reconciled     Physical Exam (Cochise Dinneen A. Jalicia Roszak MD; 04/26/2020 12:38 PM)  Head and Neck Head-normocephalic, atraumatic with no lesions or palpable masses. Trachea-midline. Thyroid Gland Characteristics - normal size and consistency.  Breast Breast - Left-Symmetric, Non Tender, No Biopsy scars, no Dimpling - Left, No Inflammation, No Lumpectomy scars, No Mastectomy scars, No Peau d' Orange. Breast - Right-Symmetric, Non Tender, No Biopsy scars, no Dimpling - Right, No Inflammation, No Lumpectomy scars, No Mastectomy scars, No Peau d' Orange. Breast Lump-No Palpable Breast Mass.  Neurologic Neurologic evaluation reveals -alert and oriented x 3 with no impairment of recent or remote memory. Mental Status-Normal.  Lymphatic Head & Neck  General  Head & Neck Lymphatics: Bilateral - Description - Normal. Axillary  General Axillary Region: Bilateral - Description - Normal. Tenderness - Non Tender.    Assessment & Plan (Traylon Schimming A. Ardyn Forge MD; 04/26/2020 12:41 PM)  BREAST CANCER, STAGE 2, RIGHT (C50.911) Impression: excellent response  Patient desires breast conserving surgery which are fills possible with the use of multiple seeds. Discussed right breast seed lumpectomy as well as right axillary sentinel lymph node mapping.  Risk of lumpectomy include bleeding, infection, seroma, more surgery, use of seed/wire, wound care, cosmetic deformity and the need for other treatments, death , blood clots, death. Pt agrees to proceed. Risk of sentinel lymph node mapping include bleeding, infection, lymphedema, shoulder pain. stiffness, dye allergy. cosmetic deformity , blood clots, death, need for more surgery. Pt agrees to proceed.   will need multiple seeds TOTAL TIME 45 MINUTES  Risk of lumpectomy include bleeding, infection, seroma, more surgery, use of seed/wire, wound care, cosmetic deformity and the need for other treatments, death , blood clots, death. Pt agrees to proceed. Risk of sentinel lymph node mapping include bleeding, infection, lymphedema, shoulder pain. stiffness, dye allergy. cosmetic deformity , blood clots, death, need for more surgery. Pt agrees to proceed. .   Total time 45 minutes discussing surgery, complications, reviewing magnetic resonance imaging, reviewing results and discussing care as well as face-to-face time.  Current Plans You are being scheduled for surgery- Our schedulers will call you.  You should hear from our office's scheduling department within 5 working days about the location, date, and time of surgery. We try to make accommodations for patient's preferences in scheduling surgery, but sometimes the OR schedule or the surgeon's schedule prevents [place] from making those accommodations.  If you have  not heard from our office [phone]) in 5 working days, call the office and ask for your  surgeon's nurse.  If you have other questions about your diagnosis, plan, or surgery, call the office and ask for your surgeon's nurse.  Pt Education - [facility] Breast Cancer Information Given - [facility] "Breast Journey" Package We discussed the staging and pathophysiology of breast cancer. We discussed all of the different options for treatment for breast cancer including surgery, chemotherapy, radiation therapy, Herceptin, and antiestrogen therapy. We discussed a sentinel lymph node biopsy as she does not appear to having lymph node involvement right now. We discussed the performance of that with injection of radioactive tracer and blue dye. We discussed that she would have an incision underneath her axillary hairline. We discussed that there is a bout a 10-20% chance of having a positive node with a sentinel lymph node biopsy and we will await the permanent pathology to make any other first further decisions in terms of her treatment. One of these options might be to return to the operating room to perform an axillary lymph node dissection. We discussed about a 1-2% risk lifetime of chronic shoulder pain as well as lymphedema associated with a sentinel lymph node biopsy. We discussed the options for treatment of the breast cancer which included lumpectomy versus a mastectomy. We discussed the performance of the lumpectomy with a wire placement. We discussed a 10-20% chance of a positive margin requiring reexcision in the operating room. We also discussed that she may need radiation therapy or antiestrogen therapy or both if she undergoes lumpectomy. We discussed the mastectomy and the postoperative care for that as well. We discussed that there is no difference in her survival whether she undergoes lumpectomy with radiation therapy or antiestrogen therapy versus a mastectomy. There is a slight difference in the local  recurrence rate being 3-5% with lumpectomy and about 1% with a mastectomy. We discussed the risks of operation including bleeding, infection, possible reoperation. She understands her further therapy will be based on what her stages at the time of her operation.  Pt Education - flb breast cancer surgery: discussed with patient and provided information. Pt Education - Pamphlet Given - Breast Biopsy: discussed with patient and provided information. Pt Education - ABC (After Breast Cancer) Class Info: discussed with patient and provided information.

## 2020-05-02 ENCOUNTER — Encounter: Payer: Self-pay | Admitting: *Deleted

## 2020-05-04 ENCOUNTER — Other Ambulatory Visit: Payer: Self-pay | Admitting: Surgery

## 2020-05-04 DIAGNOSIS — C50911 Malignant neoplasm of unspecified site of right female breast: Secondary | ICD-10-CM

## 2020-05-05 ENCOUNTER — Encounter: Payer: Self-pay | Admitting: *Deleted

## 2020-05-10 ENCOUNTER — Encounter (HOSPITAL_BASED_OUTPATIENT_CLINIC_OR_DEPARTMENT_OTHER): Payer: Self-pay | Admitting: Surgery

## 2020-05-10 ENCOUNTER — Other Ambulatory Visit: Payer: Self-pay

## 2020-05-10 NOTE — Assessment & Plan Note (Signed)
12/09/2019:Screening mammogram showed a right breast asymmetry. Diagnostic mammogram showed a 2.9cm mass at the 9 o'clock position with 2 adjacent masses at the 10 and 8 o'clock positions, 0.9cm and 1.4cm respectively, and an enlarged lymph node, 1.3cm. 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 Neoadjuvant chemotherapy with TCH Perjeta 6 cycles followed by Herceptinand Perjeta versus Kadcylamaintenance for 1 year(also could be randomized compass HER 2 trial) 2. Followed by breast conserving surgery if possible with sentinel lymph node study 3. Followed by adjuvant radiation therapy if patient had lumpectomy 4.Followed by adjuvant antiestrogen therapy SWOG S 1714 neuropathy clinical trial: No adverse effects from participating in the trial 12/31/2019: Breast MRI: Known malignancy 3.1 cm, with non-mass enhancement total measuring 4.2 cm. Additional masses 6 mm and 3 and 4 mm MRI guided biopsy on the 2 additional masses:Invasive lobular cancer with LCIS, retroareolar: LCIS grade 2 --------------------------------------------------------------------------------------------------------------------------------------------- Current treatment:Herceptin Perjeta maintenance Breast MRI 04/25/2020: Marked improvement in the enhancement of the right breast residual enhancement 0.6 cm (was 3.1 cm)  Return to clinic after surgery to discuss the final pathology report and to see if she goes on Herceptin Perjeta maintenance or Kadcyla maintenance or go on Compass her 2 clinical trial

## 2020-05-10 NOTE — Progress Notes (Signed)
Patient Care Team: Katherina Mires, MD as PCP - General (Family Medicine) Rockwell Germany, RN as Oncology Nurse Navigator Mauro Kaufmann, RN as Oncology Nurse Navigator Erroll Luna, MD as Consulting Physician (General Surgery) Nicholas Lose, MD as Consulting Physician (Hematology and Oncology) Gery Pray, MD as Consulting Physician (Radiation Oncology)  DIAGNOSIS:    ICD-10-CM   1. Malignant neoplasm of upper-outer quadrant of right breast in female, estrogen receptor positive ([place])  C50.411    Z17.0     SUMMARY OF ONCOLOGIC HISTORY: Oncology History  Malignant neoplasm of upper-outer quadrant of right breast in female, estrogen receptor positive ([place])  12/16/2019 Initial Diagnosis   Screening mammogram  showed a right breast asymmetry. Diagnostic mammogram showed a 2.9cm mass at the 9 o'clock position with 2 adjacent masses at the 10 and 8 o'clock positions, 0.9cm and 1.4cm respectively, and an enlarged lymph node, 1.3cm.  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 The report date is 12/31/2019.   The Multi-Cancer Panel offered by [facility] includes sequencing and/or deletion duplication testing of the following 85 genes: AIP, ALK, APC, ATM, AXIN2,BAP1,  BARD1, BLM, BMPR1A, BRCA1, BRCA2, BRIP1, CASR, CDC73, CDH1, CDK4, CDKN1B, CDKN1C, CDKN2A (p14ARF), CDKN2A (p16INK4a), CEBPA, CHEK2, CTNNA1, DICER1, DIS3L2, EGFR (c.2369C>T, p.Thr790Met variant only), EPCAM (Deletion/duplication testing only), FH, FLCN, GATA2, GPC3, GREM1 (Promoter region deletion/duplication testing only), HOXB13 (c.251G>A, p.Gly84Glu), HRAS, KIT, MAX, MEN1, MET, MITF (c.952G>A, p.Glu318Lys variant only), MLH1, MSH2, MSH3, MSH6, MUTYH, NBN, NF1,  NF2, NTHL1, PALB2, PDGFRA, PHOX2B, PMS2, POLD1, POLE, POT1, PRKAR1A, PTCH1, PTEN, RAD50, RAD51C, RAD51D, RB1, RECQL4, RET, RNF43, RUNX1, SDHAF2, SDHA (sequence changes only), SDHB, SDHC, SDHD, SMAD4, SMARCA4, SMARCB1, SMARCE1, STK11, SUFU, TERC, TERT, TMEM127, TP53, TSC1, TSC2, VHL, WRN and WT1.   01/06/2020 - 04/22/2020 Chemotherapy         05/11/2020 -  Chemotherapy      Patient is on Antibody Plan: BREAST TRASTUZUMAB + PERTUZUMAB Q21D      CHIEF COMPLIANT: Follow-up to review MRI, Herceptin Perjeta maintenance  INTERVAL HISTORY: Michelle Anthony is a 55 y.o. with above-mentioned history of right breast cancer who completed neoadjuvant chemotherapy with [place].Breast MRI on 04/24/20 showed improvement in the right breast carcinoma, with residual enhancement measuring 0.6cm, compared to 3.1cm prior to treatment. She presents to the clinic todayto review her MRI.  She will also receive her first dose of Herceptin and Perjeta maintenance.  Neuropathy is mostly in her feet.  Denies any numbness in the fingers.  Denies any nausea or vomiting.  Energy levels are low but she is able to function well.  ALLERGIES:  has No Known Allergies.  MEDICATIONS:  Current Outpatient Medications  Medication Sig Dispense Refill  . COLLAGEN PO Apply topically daily.    [name] ibuprofen (ADVIL) 800 MG tablet Take 1 tablet (800 mg total) by mouth every 8 (eight) hours as needed. 30 tablet 0  . Multiple Vitamin (MULTIVITAMIN) tablet Take 1 tablet by mouth daily.    . Tretinoin (RETIN-A EX) Apply topically.     No current facility-administered medications for this visit.    PHYSICAL EXAMINATION: ECOG PERFORMANCE STATUS: 1 - Symptomatic but completely ambulatory  Vitals:   05/11/20 1020  BP: 111/62  Pulse:  84  Resp: 18  Temp: 97.7 F (36.5 C)  SpO2: 99%   Filed Weights   05/11/20 1020  Weight: 143 lb 6.4 oz (65 kg)     LABORATORY DATA:  I have reviewed the data as listed CMP Latest Ref Rng &  Units 04/20/2020 03/30/2020 03/09/2020  Glucose 70 - 99 mg/dL 102(H) 102(H) 103(H)  BUN 6 - 20 mg/dL _0 Creatinine 0.44 - 1.00 mg/dL 0.67 0.66 0.68  Sodium 135 - 145 mmol/L 141 140 141  Potassium 3.5 - 5.1 mmol/L 3.9 3.9 3.9  Chloride 98 - 111 mmol/L 107 106 105  CO2 22 - 32 mmol/L _1 Calcium 8.9 - 10.3 mg/dL 9.5 9.8 9.4  Total Protein 6.5 - 8.1 g/dL 7.0 6.8 6.8  Total Bilirubin 0.3 - 1.2 mg/dL 0.4 0.5 0.4  Alkaline Phos 38 - 126 U/L 68 72 80  AST 15 - 41 U/L _2 ALT 0 - 44 U/L 35 32 25    Lab Results  Component Value Date   WBC 7.6 04/20/2020   HGB 11.3 (L) 04/20/2020   HCT 34.3 (L) 04/20/2020   MCV 99.7 04/20/2020   PLT 240 04/20/2020   NEUTROABS 5.4 04/20/2020    ASSESSMENT & PLAN:  Malignant neoplasm of upper-outer quadrant of right breast in female, estrogen receptor positive ([place]) 12/09/2019:Screening mammogram showed a right breast asymmetry. Diagnostic mammogram showed a 2.9cm mass at the 9 o'clock position with 2 adjacent masses at the 10 and 8 o'clock positions, 0.9cm and 1.4cm respectively, and an enlarged lymph node, 1.3cm. 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 Neoadjuvant chemotherapy with TCH Perjeta 6 cycles followed by Herceptinand Perjeta versus Kadcylamaintenance for 1 year(also could be randomized compass HER 2 trial) 2. Followed by breast conserving surgery if possible with sentinel lymph node study 3. Followed by adjuvant radiation therapy if patient had lumpectomy 4.Followed by adjuvant antiestrogen therapy SWOG S 1714 neuropathy clinical trial: No adverse effects from participating in the trial 12/31/2019: Breast MRI: Known malignancy 3.1 cm, with non-mass enhancement total measuring 4.2 cm. Additional masses 6 mm and 3 and 4 mm MRI guided biopsy on the 2 additional masses:Invasive  lobular cancer with LCIS, retroareolar: LCIS grade 2 --------------------------------------------------------------------------------------------------------------------------------------------- Current treatment:Herceptin Perjeta maintenance Breast MRI 04/25/2020: Marked improvement in the enhancement of the right breast residual enhancement 0.6 cm (was 3.1 cm)  Return to clinic after surgery to discuss the final pathology report and to see if she goes on Herceptin Perjeta maintenance or Kadcyla maintenance or go on Compass her 2 clinical trial We briefly discussed Kadcyla and its risks and benefits.  We will talk more when she comes after her surgery.   No orders of the defined types were placed in this encounter.  The patient has a good understanding of the overall plan. she agrees with it. she will call with any problems that may develop before the next visit here.  Total time spent: 30 mins including face to face time and time spent for planning, charting and coordination of care  Nicholas Lose, MD 05/11/2020  I, Cloyde Reams Dorshimer, am acting as scribe for Dr. Nicholas Lose.  I have reviewed the above documentation for accuracy and completeness, and I agree with the above.

## 2020-05-11 ENCOUNTER — Other Ambulatory Visit: Payer: BC Managed Care – PPO

## 2020-05-11 ENCOUNTER — Inpatient Hospital Stay: Payer: BC Managed Care – PPO

## 2020-05-11 ENCOUNTER — Other Ambulatory Visit: Payer: Self-pay | Admitting: Hematology and Oncology

## 2020-05-11 ENCOUNTER — Inpatient Hospital Stay: Payer: BC Managed Care – PPO | Admitting: Hematology and Oncology

## 2020-05-11 ENCOUNTER — Other Ambulatory Visit: Payer: Self-pay

## 2020-05-11 ENCOUNTER — Encounter (HOSPITAL_BASED_OUTPATIENT_CLINIC_OR_DEPARTMENT_OTHER)
Admission: RE | Admit: 2020-05-11 | Discharge: 2020-05-11 | Disposition: A | Discharge status: Home / Self Care | Payer: BC Managed Care – PPO | Source: Ambulatory Visit | Attending: Surgery | Admitting: Surgery

## 2020-05-11 DIAGNOSIS — Z01812 Encounter for preprocedural laboratory examination: Secondary | ICD-10-CM | POA: Diagnosis present

## 2020-05-11 DIAGNOSIS — Z170 Estrogen receptor positive status [ER+]: Secondary | ICD-10-CM

## 2020-05-11 DIAGNOSIS — C50411 Malignant neoplasm of upper-outer quadrant of right female breast: Secondary | ICD-10-CM

## 2020-05-11 LAB — COMPREHENSIVE METABOLIC PANEL
ALT: 25 U/L (ref 0–44)
AST: 19 U/L (ref 15–41)
Albumin: 3.5 g/dL (ref 3.5–5.0)
Alkaline Phosphatase: 55 U/L (ref 38–126)
Anion gap: 7 (ref 5–15)
BUN: 15 mg/dL (ref 6–20)
CO2: 28 mmol/L (ref 22–32)
Calcium: 9 mg/dL (ref 8.9–10.3)
Chloride: 106 mmol/L (ref 98–111)
Creatinine, Ser: 0.74 mg/dL (ref 0.44–1.00)
GFR, Estimated: >60 mL/min (ref >60)
Glucose, Bld: 83 mg/dL (ref 70–99)
Potassium: 3.6 mmol/L (ref 3.5–5.1)
Sodium: 141 mmol/L (ref 135–145)
Total Bilirubin: 0.4 mg/dL (ref 0.3–1.2)
Total Protein: 5.9 g/dL — ABNORMAL LOW (ref 6.5–8.1)

## 2020-05-11 LAB — CBC WITH DIFFERENTIAL/PLATELET
Abs Immature Granulocytes: 0.02 10*3/uL (ref 0.00–0.07)
Basophils Absolute: 0.1 10*3/uL (ref 0.0–0.1)
Basophils Relative: 1 %
Eosinophils Absolute: 0 10*3/uL (ref 0.0–0.5)
Eosinophils Relative: 0 %
HCT: 34.3 % — ABNORMAL LOW (ref 36.0–46.0)
Hemoglobin: 11 g/dL — ABNORMAL LOW (ref 12.0–15.0)
Immature Granulocytes: 0 %
Lymphocytes Relative: 33 %
Lymphs Abs: 2.6 10*3/uL (ref 0.7–4.0)
MCH: 32.7 pg (ref 26.0–34.0)
MCHC: 32.1 g/dL (ref 30.0–36.0)
MCV: 102.1 fL — ABNORMAL HIGH (ref 80.0–100.0)
Monocytes Absolute: 0.8 10*3/uL (ref 0.1–1.0)
Monocytes Relative: 10 %
Neutro Abs: 4.3 10*3/uL (ref 1.7–7.7)
Neutrophils Relative %: 56 %
Platelets: 223 10*3/uL (ref 150–400)
RBC: 3.36 MIL/uL — ABNORMAL LOW (ref 3.87–5.11)
RDW: 14.3 % (ref 11.5–15.5)
WBC: 7.7 10*3/uL (ref 4.0–10.5)
nRBC: 0 % (ref 0.0–0.2)

## 2020-05-11 MED ORDER — SODIUM CHLORIDE 0.9% FLUSH
10.0000 mL | INTRAVENOUS | Status: DC | PRN
  Administered 2020-05-11: 10 mL
  Filled 2020-05-11: qty 10

## 2020-05-11 MED ORDER — HEPARIN SOD (PORK) LOCK FLUSH 100 UNIT/ML IV SOLN
500.0000 [IU] | Freq: Once | INTRAVENOUS | Status: AC | PRN
  Administered 2020-05-11: 500 [IU]
  Filled 2020-05-11: qty 5

## 2020-05-11 MED ORDER — TRASTUZUMAB-DKST CHEMO 150 MG IV SOLR: 6.0000 mg/kg | Freq: Once | INTRAVENOUS | Status: DC

## 2020-05-11 MED ORDER — TRASTUZUMAB-DKST CHEMO 150 MG IV SOLR
399.0000 mg | Freq: Once | INTRAVENOUS | Status: AC
  Administered 2020-05-11: 399 mg via INTRAVENOUS
  Filled 2020-05-11: qty 19

## 2020-05-11 MED ORDER — ACETAMINOPHEN 325 MG PO TABS
650.0000 mg | ORAL_TABLET | Freq: Once | ORAL | Status: AC
  Administered 2020-05-11: 650 mg via ORAL

## 2020-05-11 MED ORDER — DIPHENHYDRAMINE HCL 25 MG PO CAPS
50.0000 mg | ORAL_CAPSULE | Freq: Once | ORAL | Status: AC
  Administered 2020-05-11: 50 mg via ORAL

## 2020-05-11 MED ORDER — CHLORHEXIDINE GLUCONATE CLOTH 2 % EX PADS: 6.0000 | MEDICATED_PAD | Freq: Once | CUTANEOUS | Status: DC

## 2020-05-11 MED ORDER — DIPHENHYDRAMINE HCL 25 MG PO CAPS
ORAL_CAPSULE | ORAL | Status: AC
  Filled 2020-05-11: qty 2

## 2020-05-11 MED ORDER — ACETAMINOPHEN 325 MG PO TABS
ORAL_TABLET | ORAL | Status: AC
  Filled 2020-05-11: qty 2

## 2020-05-11 MED ORDER — SODIUM CHLORIDE 0.9 % IV SOLN
Freq: Once | INTRAVENOUS | Status: AC
  Filled 2020-05-11: qty 250

## 2020-05-11 MED ORDER — SODIUM CHLORIDE 0.9 % IV SOLN
420.0000 mg | Freq: Once | INTRAVENOUS | Status: AC
  Administered 2020-05-11: 420 mg via INTRAVENOUS
  Filled 2020-05-11: qty 14

## 2020-05-11 NOTE — Progress Notes (Signed)

## 2020-05-11 NOTE — Patient Instructions (Signed)
[facility] Discharge Instructions for Patients Receiving Chemotherapy  Today you received the following chemotherapy agents: trastuzumab (Ogivri) & pertuzumab (Perjeta)  To help prevent nausea and vomiting after your treatment, we encourage you to take your nausea medication as directed.   If you develop nausea and vomiting that is not controlled by your nausea medication, call the clinic.   BELOW ARE SYMPTOMS THAT SHOULD BE REPORTED IMMEDIATELY:  *FEVER GREATER THAN 100.5 F  *CHILLS WITH OR WITHOUT FEVER  NAUSEA AND VOMITING THAT IS NOT CONTROLLED WITH YOUR NAUSEA MEDICATION  *UNUSUAL SHORTNESS OF BREATH  *UNUSUAL BRUISING OR BLEEDING  TENDERNESS IN MOUTH AND THROAT WITH OR WITHOUT PRESENCE OF ULCERS  *URINARY PROBLEMS  *BOWEL PROBLEMS  UNUSUAL RASH Items with * indicate a potential emergency and should be followed up as soon as possible.  Feel free to call the clinic should you have any questions or concerns. The clinic phone number is [phone].  Please show the [place] at check-in to the Emergency Department and triage nurse.

## 2020-05-13 ENCOUNTER — Other Ambulatory Visit (HOSPITAL_COMMUNITY)
Admission: RE | Admit: 2020-05-13 | Discharge: 2020-05-13 | Disposition: A | Discharge status: Home / Self Care | Payer: BC Managed Care – PPO | Source: Ambulatory Visit | Attending: Surgery | Admitting: Surgery

## 2020-05-13 DIAGNOSIS — Z20822 Contact with and (suspected) exposure to covid-19: Secondary | ICD-10-CM | POA: Insufficient documentation

## 2020-05-13 DIAGNOSIS — Z01812 Encounter for preprocedural laboratory examination: Secondary | ICD-10-CM | POA: Diagnosis not present

## 2020-05-13 LAB — SARS CORONAVIRUS 2 (TAT 6-24 HRS): SARS Coronavirus 2: NEGATIVE

## 2020-05-16 ENCOUNTER — Other Ambulatory Visit: Payer: Self-pay | Admitting: Surgery

## 2020-05-16 ENCOUNTER — Ambulatory Visit
Admission: RE | Admit: 2020-05-16 | Discharge: 2020-05-16 | Disposition: A | Discharge status: Home / Self Care | Payer: BC Managed Care – PPO | Source: Ambulatory Visit | Attending: Surgery | Admitting: Surgery

## 2020-05-16 ENCOUNTER — Other Ambulatory Visit: Payer: Self-pay

## 2020-05-16 DIAGNOSIS — C50911 Malignant neoplasm of unspecified site of right female breast: Secondary | ICD-10-CM

## 2020-05-16 NOTE — Anesthesia Preprocedure Evaluation (Addendum)
Anesthesia Evaluation  Patient identified by MRN, date of birth, ID band Patient awake    Reviewed: Allergy & Precautions, H&P , NPO status , Patient's Chart, lab work & pertinent test results  History of Anesthesia Complications Negative for: history of anesthetic complications  Airway Mallampati: I  TM Distance: >3 FB Neck ROM: Full    Dental no notable dental hx. (+) Teeth Intact, Dental Advisory Given   Pulmonary neg pulmonary ROS, former smoker,    Pulmonary exam normal breath sounds clear to auscultation       Cardiovascular negative cardio ROS Normal cardiovascular exam Rhythm:Regular Rate:Normal  12/30/2019 ECHO: LV function low normal, EF 50-55%, mild prolapse of anterior leaflet of the mitral valve, trivial MR   Neuro/Psych negative neurological ROS  negative psych ROS   GI/Hepatic negative GI ROS, Neg liver ROS,   Endo/Other  negative endocrine ROS  Renal/GU negative Renal ROS  negative genitourinary   Musculoskeletal negative musculoskeletal ROS (+) Arthritis ,   Abdominal   Peds negative pediatric ROS (+)  Hematology negative hematology ROS (+)   Anesthesia Other Findings   Reproductive/Obstetrics negative OB ROS                            Anesthesia Physical  Anesthesia Plan  ASA: II  Anesthesia Plan: General   Post-op Pain Management: GA combined w/ Regional for post-op pain   Induction: Intravenous  PONV Risk Score and Plan: 3 and Ondansetron, Dexamethasone and Treatment may vary due to age or medical condition  Airway Management Planned: LMA and Oral ETT  Additional Equipment: None  Intra-op Plan:   Post-operative Plan: Extubation in OR  Informed Consent: I have reviewed the patients History and Physical, chart, labs and discussed the procedure including the risks, benefits and alternatives for the proposed anesthesia with the patient or authorized  representative who has indicated his/her understanding and acceptance.     Dental advisory given  Plan Discussed with: CRNA, Surgeon and Anesthesiologist  Anesthesia Plan Comments: (Discussed both nerve block for pain relief post-op and GA; including NV, sore throat, dental injury, and pulmonary complications)        Anesthesia Quick Evaluation

## 2020-05-17 ENCOUNTER — Encounter (HOSPITAL_BASED_OUTPATIENT_CLINIC_OR_DEPARTMENT_OTHER): Payer: Self-pay | Admitting: Surgery

## 2020-05-17 ENCOUNTER — Ambulatory Visit
Admission: RE | Admit: 2020-05-17 | Discharge: 2020-05-17 | Disposition: A | Discharge status: Home / Self Care | Payer: BC Managed Care – PPO | Source: Ambulatory Visit | Attending: Surgery | Admitting: Surgery

## 2020-05-17 ENCOUNTER — Encounter (HOSPITAL_BASED_OUTPATIENT_CLINIC_OR_DEPARTMENT_OTHER)
Admission: RE | Disposition: A | Discharge status: Home / Self Care | Payer: Self-pay | Source: Home / Self Care | Attending: Surgery

## 2020-05-17 ENCOUNTER — Ambulatory Visit (HOSPITAL_BASED_OUTPATIENT_CLINIC_OR_DEPARTMENT_OTHER): Payer: BC Managed Care – PPO | Admitting: Anesthesiology

## 2020-05-17 ENCOUNTER — Ambulatory Visit (HOSPITAL_BASED_OUTPATIENT_CLINIC_OR_DEPARTMENT_OTHER)
Admission: RE | Admit: 2020-05-17 | Discharge: 2020-05-17 | Disposition: A | Discharge status: Home / Self Care | Payer: BC Managed Care – PPO | Attending: Surgery | Admitting: Surgery

## 2020-05-17 ENCOUNTER — Ambulatory Visit (HOSPITAL_COMMUNITY)
Admission: RE | Admit: 2020-05-17 | Discharge: 2020-05-17 | Disposition: A | Discharge status: Home / Self Care | Payer: BC Managed Care – PPO | Source: Ambulatory Visit | Attending: Surgery | Admitting: Surgery

## 2020-05-17 ENCOUNTER — Other Ambulatory Visit: Payer: Self-pay

## 2020-05-17 DIAGNOSIS — C50911 Malignant neoplasm of unspecified site of right female breast: Secondary | ICD-10-CM

## 2020-05-17 DIAGNOSIS — C50411 Malignant neoplasm of upper-outer quadrant of right female breast: Secondary | ICD-10-CM | POA: Insufficient documentation

## 2020-05-17 DIAGNOSIS — Z170 Estrogen receptor positive status [ER+]: Secondary | ICD-10-CM | POA: Diagnosis not present

## 2020-05-17 HISTORY — PX: BREAST LUMPECTOMY WITH RADIOACTIVE SEED AND SENTINEL LYMPH NODE BIOPSY: SHX6550

## 2020-05-17 SURGERY — BREAST LUMPECTOMY WITH RADIOACTIVE SEED AND SENTINEL LYMPH NODE BIOPSY
Anesthesia: General | Site: Breast | Laterality: Right

## 2020-05-17 MED ORDER — GABAPENTIN 300 MG PO CAPS
ORAL_CAPSULE | ORAL | Status: AC
  Filled 2020-05-17: qty 1

## 2020-05-17 MED ORDER — LIDOCAINE HCL (CARDIAC) PF 100 MG/5ML IV SOSY
PREFILLED_SYRINGE | INTRAVENOUS | Status: DC | PRN
  Administered 2020-05-17: 50 mg via INTRATRACHEAL

## 2020-05-17 MED ORDER — ACETAMINOPHEN 500 MG PO TABS
ORAL_TABLET | ORAL | Status: AC
  Filled 2020-05-17: qty 2

## 2020-05-17 MED ORDER — LACTATED RINGERS IV SOLN: INTRAVENOUS | Status: DC

## 2020-05-17 MED ORDER — FENTANYL CITRATE (PF) 100 MCG/2ML IJ SOLN
INTRAMUSCULAR | Status: AC
  Filled 2020-05-17: qty 2

## 2020-05-17 MED ORDER — MIDAZOLAM HCL 2 MG/2ML IJ SOLN
1.0000 mg | Freq: Once | INTRAMUSCULAR | Status: AC
  Administered 2020-05-17: 2 mg via INTRAVENOUS

## 2020-05-17 MED ORDER — EPHEDRINE 5 MG/ML INJ
INTRAVENOUS | Status: AC
  Filled 2020-05-17: qty 10

## 2020-05-17 MED ORDER — OXYCODONE HCL 5 MG PO TABS
5.0000 mg | ORAL_TABLET | Freq: Once | ORAL | Status: AC | PRN
  Administered 2020-05-17: 5 mg via ORAL

## 2020-05-17 MED ORDER — METHYLENE BLUE 0.5 % INJ SOLN
INTRAVENOUS | Status: AC
  Filled 2020-05-17: qty 10

## 2020-05-17 MED ORDER — OXYCODONE HCL 5 MG/5ML PO SOLN: 5.0000 mg | Freq: Once | ORAL | Status: AC | PRN

## 2020-05-17 MED ORDER — ONDANSETRON HCL 4 MG/2ML IJ SOLN
INTRAMUSCULAR | Status: DC | PRN
  Administered 2020-05-17: 4 mg via INTRAVENOUS

## 2020-05-17 MED ORDER — ROPIVACAINE HCL 7.5 MG/ML IJ SOLN
INTRAMUSCULAR | Status: DC | PRN
  Administered 2020-05-17: 25 mL via PERINEURAL

## 2020-05-17 MED ORDER — BUPIVACAINE HCL (PF) 0.25 % IJ SOLN
INTRAMUSCULAR | Status: DC | PRN
  Administered 2020-05-17: 24 mL

## 2020-05-17 MED ORDER — LIDOCAINE 2% (20 MG/ML) 5 ML SYRINGE
INTRAMUSCULAR | Status: AC
  Filled 2020-05-17: qty 5

## 2020-05-17 MED ORDER — DEXAMETHASONE SODIUM PHOSPHATE 10 MG/ML IJ SOLN
INTRAMUSCULAR | Status: DC | PRN
  Administered 2020-05-17: 10 mg

## 2020-05-17 MED ORDER — SODIUM CHLORIDE (PF) 0.9 % IJ SOLN
INTRAMUSCULAR | Status: AC
  Filled 2020-05-17: qty 10

## 2020-05-17 MED ORDER — OXYCODONE HCL 5 MG PO TABS
ORAL_TABLET | ORAL | Status: AC
  Filled 2020-05-17: qty 1

## 2020-05-17 MED ORDER — CEFAZOLIN SODIUM-DEXTROSE 2-4 GM/100ML-% IV SOLN
2.0000 g | INTRAVENOUS | Status: AC
  Administered 2020-05-17: 2 g via INTRAVENOUS

## 2020-05-17 MED ORDER — ACETAMINOPHEN 325 MG PO TABS
325.0000 mg | ORAL_TABLET | ORAL | Status: DC | PRN
Start: 2020-05-17 — End: 2020-05-17

## 2020-05-17 MED ORDER — ONDANSETRON HCL 4 MG/2ML IJ SOLN
INTRAMUSCULAR | Status: AC
  Filled 2020-05-17: qty 2

## 2020-05-17 MED ORDER — ACETAMINOPHEN 500 MG PO TABS
1000.0000 mg | ORAL_TABLET | ORAL | Status: AC
  Administered 2020-05-17: 1000 mg via ORAL

## 2020-05-17 MED ORDER — MIDAZOLAM HCL 2 MG/2ML IJ SOLN
INTRAMUSCULAR | Status: AC
  Filled 2020-05-17: qty 2

## 2020-05-17 MED ORDER — MEPERIDINE HCL 25 MG/ML IJ SOLN: 6.2500 mg | INTRAMUSCULAR | Status: DC | PRN

## 2020-05-17 MED ORDER — EPHEDRINE SULFATE 50 MG/ML IJ SOLN
INTRAMUSCULAR | Status: DC | PRN
  Administered 2020-05-17: 10 mg via INTRAVENOUS
  Administered 2020-05-17: 5 mg via INTRAVENOUS

## 2020-05-17 MED ORDER — ONDANSETRON HCL 4 MG/2ML IJ SOLN: 4.0000 mg | Freq: Once | INTRAMUSCULAR | Status: DC | PRN

## 2020-05-17 MED ORDER — TECHNETIUM TC 99M TILMANOCEPT KIT
1.0000 | PACK | Freq: Once | INTRAVENOUS | Status: AC | PRN
  Administered 2020-05-17: 1 via INTRADERMAL

## 2020-05-17 MED ORDER — BUPIVACAINE HCL (PF) 0.25 % IJ SOLN
INTRAMUSCULAR | Status: AC
  Filled 2020-05-17: qty 120

## 2020-05-17 MED ORDER — HYDROCODONE-ACETAMINOPHEN 5-325 MG PO TABS: 1.0000 | ORAL_TABLET | Freq: Four times a day (QID) | ORAL | 0 refills | Status: DC | PRN

## 2020-05-17 MED ORDER — ACETAMINOPHEN 160 MG/5ML PO SOLN: 325.0000 mg | ORAL | Status: DC | PRN

## 2020-05-17 MED ORDER — FENTANYL CITRATE (PF) 100 MCG/2ML IJ SOLN
INTRAMUSCULAR | Status: DC | PRN
  Administered 2020-05-17: 50 ug via INTRAVENOUS

## 2020-05-17 MED ORDER — PROPOFOL 10 MG/ML IV BOLUS
INTRAVENOUS | Status: AC
  Filled 2020-05-17: qty 20

## 2020-05-17 MED ORDER — CELECOXIB 200 MG PO CAPS
ORAL_CAPSULE | ORAL | Status: AC
  Filled 2020-05-17: qty 1

## 2020-05-17 MED ORDER — CELECOXIB 200 MG PO CAPS
200.0000 mg | ORAL_CAPSULE | ORAL | Status: AC
  Administered 2020-05-17: 200 mg via ORAL

## 2020-05-17 MED ORDER — DEXAMETHASONE SODIUM PHOSPHATE 10 MG/ML IJ SOLN
INTRAMUSCULAR | Status: AC
  Filled 2020-05-17: qty 1

## 2020-05-17 MED ORDER — PHENYLEPHRINE HCL (PRESSORS) 10 MG/ML IV SOLN
INTRAVENOUS | Status: DC | PRN
  Administered 2020-05-17: 80 ug via INTRAVENOUS

## 2020-05-17 MED ORDER — GABAPENTIN 300 MG PO CAPS
300.0000 mg | ORAL_CAPSULE | ORAL | Status: AC
  Administered 2020-05-17: 300 mg via ORAL

## 2020-05-17 MED ORDER — PHENYLEPHRINE 40 MCG/ML (10ML) SYRINGE FOR IV PUSH (FOR BLOOD PRESSURE SUPPORT)
PREFILLED_SYRINGE | INTRAVENOUS | Status: AC
  Filled 2020-05-17: qty 10

## 2020-05-17 MED ORDER — FENTANYL CITRATE (PF) 100 MCG/2ML IJ SOLN
50.0000 ug | Freq: Once | INTRAMUSCULAR | Status: AC
  Administered 2020-05-17: 100 ug via INTRAVENOUS

## 2020-05-17 MED ORDER — FENTANYL CITRATE (PF) 100 MCG/2ML IJ SOLN: 25.0000 ug | INTRAMUSCULAR | Status: DC | PRN

## 2020-05-17 MED ORDER — PROPOFOL 10 MG/ML IV BOLUS
INTRAVENOUS | Status: DC | PRN
  Administered 2020-05-17: 140 mg via INTRAVENOUS
  Administered 2020-05-17: 60 mg via INTRAVENOUS

## 2020-05-17 MED ORDER — CEFAZOLIN SODIUM-DEXTROSE 2-4 GM/100ML-% IV SOLN
INTRAVENOUS | Status: AC
  Filled 2020-05-17: qty 100

## 2020-05-17 MED ORDER — DEXAMETHASONE SODIUM PHOSPHATE 10 MG/ML IJ SOLN
INTRAMUSCULAR | Status: DC | PRN
  Administered 2020-05-17: 4 mg via INTRAVENOUS

## 2020-05-17 SURGICAL SUPPLY — 50 items
ADH SKN CLS APL DERMABOND .7 (GAUZE/BANDAGES/DRESSINGS) ×1
APL PRP STRL LF DISP 70% ISPRP (MISCELLANEOUS) ×1
APPLIER CLIP 9.375 MED OPEN (MISCELLANEOUS) ×2
BINDER BREAST LRG (GAUZE/BANDAGES/DRESSINGS) ×2 IMPLANT
BINDER BREAST XLRG (GAUZE/BANDAGES/DRESSINGS) IMPLANT
BIOPATCH RED 1 DISK 7.0 (GAUZE/BANDAGES/DRESSINGS) ×2 IMPLANT
BLADE SURG 15 STRL LF DISP TIS (BLADE) ×1 IMPLANT
BLADE SURG 15 STRL SS (BLADE) ×2
CANISTER SUCT 1200ML W/VALVE (MISCELLANEOUS) ×2 IMPLANT
CHLORAPREP W/TINT 26 (MISCELLANEOUS) ×2 IMPLANT
CLIP APPLIE 9.375 MED OPEN (MISCELLANEOUS) ×1 IMPLANT
COVER BACK TABLE 60X90IN (DRAPES) ×2 IMPLANT
COVER MAYO STAND STRL (DRAPES) ×2 IMPLANT
COVER PROBE W GEL 5X96 (DRAPES) ×2 IMPLANT
DERMABOND ADVANCED (GAUZE/BANDAGES/DRESSINGS) ×1
DERMABOND ADVANCED .7 DNX12 (GAUZE/BANDAGES/DRESSINGS) ×1 IMPLANT
DRAIN CHANNEL 19F RND (DRAIN) ×2 IMPLANT
DRAPE LAPAROSCOPIC ABDOMINAL (DRAPES) ×2 IMPLANT
DRAPE UTILITY XL STRL (DRAPES) ×2 IMPLANT
DRSG TEGADERM 2-3/8X2-3/4 SM (GAUZE/BANDAGES/DRESSINGS) ×2 IMPLANT
ELECT COATED BLADE 2.86 ST (ELECTRODE) ×2 IMPLANT
ELECT REM PT RETURN 9FT ADLT (ELECTROSURGICAL) ×2
ELECTRODE REM PT RTRN 9FT ADLT (ELECTROSURGICAL) ×1 IMPLANT
EVACUATOR SILICONE 100CC (DRAIN) ×2 IMPLANT
GLOVE ECLIPSE 8.0 STRL XLNG CF (GLOVE) ×2 IMPLANT
GLOVE SRG 8 PF TXTR STRL LF DI (GLOVE) ×1 IMPLANT
GLOVE SURG ENC MOIS LTX SZ6.5 (GLOVE) ×2 IMPLANT
GLOVE SURG UNDER POLY LF SZ8 (GLOVE) ×2
GOWN STRL REUS W/ TWL LRG LVL3 (GOWN DISPOSABLE) ×1 IMPLANT
GOWN STRL REUS W/ TWL XL LVL3 (GOWN DISPOSABLE) ×1 IMPLANT
GOWN STRL REUS W/TWL LRG LVL3 (GOWN DISPOSABLE) ×2
GOWN STRL REUS W/TWL XL LVL3 (GOWN DISPOSABLE) ×2
HEMOSTAT ARISTA ABSORB 3G PWDR (HEMOSTASIS) IMPLANT
HEMOSTAT SNOW SURGICEL 2X4 (HEMOSTASIS) IMPLANT
KIT MARKER MARGIN INK (KITS) ×2 IMPLANT
NEEDLE HYPO 25X1 1.5 SAFETY (NEEDLE) ×2 IMPLANT
NS IRRIG 1000ML POUR BTL (IV SOLUTION) ×2 IMPLANT
PACK BASIN DAY SURGERY FS (CUSTOM PROCEDURE TRAY) ×2 IMPLANT
PENCIL SMOKE EVACUATOR (MISCELLANEOUS) ×2 IMPLANT
PIN SAFETY STERILE (MISCELLANEOUS) ×2 IMPLANT
SLEEVE SCD COMPRESS KNEE MED (MISCELLANEOUS) ×2 IMPLANT
SPONGE LAP 4X18 RFD (DISPOSABLE) ×4 IMPLANT
SUT ETHILON 2 0 FS 18 (SUTURE) ×2 IMPLANT
SUT MNCRL AB 4-0 PS2 18 (SUTURE) ×2 IMPLANT
SUT VICRYL 3-0 CR8 SH (SUTURE) ×2 IMPLANT
SYR CONTROL 10ML LL (SYRINGE) ×2 IMPLANT
TOWEL GREEN STERILE FF (TOWEL DISPOSABLE) ×2 IMPLANT
TRAY FAXITRON CT DISP (TRAY / TRAY PROCEDURE) ×2 IMPLANT
TUBE CONNECTING 20X1/4 (TUBING) ×2 IMPLANT
YANKAUER SUCT BULB TIP NO VENT (SUCTIONS) ×2 IMPLANT

## 2020-05-17 NOTE — Anesthesia Postprocedure Evaluation (Signed)
Anesthesia Post Note  Patient: Michelle Anthony  Procedure(s) Performed: RIGHT BREAST LUMPECTOMY WITH RADIOACTIVE SEED x5 AND SENTINEL LYMPH NODE MAPPING (Right Breast)     Patient location during evaluation: PACU Anesthesia Type: General Level of consciousness: awake and alert Pain management: pain level controlled Vital Signs Assessment: post-procedure vital signs reviewed and stable Respiratory status: spontaneous breathing, nonlabored ventilation, respiratory function stable and patient connected to nasal cannula oxygen Cardiovascular status: blood pressure returned to baseline and stable Postop Assessment: no apparent nausea or vomiting Anesthetic complications: no   No complications documented.  Last Vitals:  Vitals:   05/17/20 0945 05/17/20 1013  BP: 104/66 113/72  Pulse: 80 91  Resp: 16 16  Temp:  36.9 C  SpO2: 94% 96%    Last Pain:  Vitals:   05/17/20 1013  TempSrc:   PainSc: 0-No pain                 Nevin Kozuch

## 2020-05-17 NOTE — Transfer of Care (Signed)
Immediate Anesthesia Transfer of Care Note  Patient: Michelle Anthony  Procedure(s) Performed: RIGHT BREAST LUMPECTOMY WITH RADIOACTIVE SEED x5 AND SENTINEL LYMPH NODE MAPPING (Right Breast)  Patient Location: PACU  Anesthesia Type:General  Level of Consciousness: drowsy, patient cooperative and responds to stimulation  Airway & Oxygen Therapy: Patient Spontanous Breathing and Patient connected to face mask oxygen  Post-op Assessment: Report given to RN and Post -op Vital signs reviewed and stable  Post vital signs: Reviewed and stable  Last Vitals:  Vitals Value Taken Time  BP    Temp    Pulse 87 05/17/20 0903  Resp 12 05/17/20 0903  SpO2 100 % 05/17/20 0903  Vitals shown include unvalidated device data.  Last Pain:  Vitals:   05/17/20 0637  TempSrc: Oral  PainSc: 2       Patients Stated Pain Goal: 5 (01/60/10 9323)  Complications: No complications documented.

## 2020-05-17 NOTE — Anesthesia Procedure Notes (Signed)
Procedure Name: LMA Insertion Date/Time: 05/17/2020 7:31 AM Performed by: Glory Buff, CRNA Pre-anesthesia Checklist: Patient identified, Emergency Drugs available, Suction available and Patient being monitored Patient Re-evaluated:Patient Re-evaluated prior to induction Oxygen Delivery Method: Circle system utilized Preoxygenation: Pre-oxygenation with 100% oxygen Induction Type: IV induction LMA: LMA inserted LMA Size: 4.0 Number of attempts: 1 Placement Confirmation: positive ETCO2 Tube secured with: Tape Dental Injury: Teeth and Oropharynx as per pre-operative assessment

## 2020-05-17 NOTE — Discharge Instructions (Signed)
Surgical Drain Home Care Surgical drains are used to remove extra fluid that normally builds up in a surgical wound after surgery. A surgical drain helps to heal a surgical wound. Different kinds of surgical drains include:  Active drains. These drains use suction to pull drainage away from the surgical wound. Drainage flows through a tube to a container outside of the body. With these drains, you need to keep the bulb or the drainage container flat (compressed) at all times, except while you empty it. Flattening the bulb or container creates suction.  Passive drains. These drains allow fluid to drain naturally, by gravity. Drainage flows through a tube to a bandage (dressing) or a container outside of the body. Passive drains do not need to be emptied. A drain is placed during surgery. Right after surgery, drainage is usually bright red and a little thicker than water. The drainage may gradually turn yellow or pink and become thinner. It is likely that your health care provider will remove the drain when the drainage stops or when the amount decreases to 1-2 Tbsp (15-30 mL) during a 24-hour period. Supplies needed:  Tape.  Germ-free cleaning solution (sterile saline).  Cotton swabs.  Split gauze drain sponge: 4 x 4 inches (10 x 10 cm).  Gauze square: 4 x 4 inches (10 x 10 cm). How to care for your surgical drain Care for your drain as told by your health care provider. This is important to help prevent infection. If your drain is placed at your back, or any other hard-to-reach area, ask another person to assist you in performing the following tasks: General care  Keep the skin around the drain dry and covered with a dressing at all times.  Check your drain area every day for signs of infection. Check for: ? Redness, swelling, or pain. ? Pus or a bad smell. ? Cloudy drainage. ? Tenderness or pressure at the drain exit site. Changing the dressing Follow instructions from your health care  provider about how to change your dressing. Change your dressing at least once a day. Change it more often if needed to keep the dressing dry. Make sure you: 1. Gather your supplies. 2. Wash your hands with soap and water before you change your dressing. If soap and water are not available, use hand sanitizer. 3. Remove the old dressing. Avoid using scissors to do that. 4. Wash your hands with soap and water again after removing the old dressing. 5. Use sterile saline to clean your skin around the drain. You may need to use a cotton swab to clean the skin. 6. Place the tube through the slit in a drain sponge. Place the drain sponge so that it covers your wound. 7. Place the gauze square or another drain sponge on top of the drain sponge that is on the wound. Make sure the tube is between those layers. 8. Tape the dressing to your skin. 9. Tape the drainage tube to your skin 1-2 inches (2.5-5 cm) below the place where the tube enters your body. Taping keeps the tube from pulling on any stitches (sutures) that you have. 10. Wash your hands with soap and water. 11. Write down the color of your drainage and how often you change your dressing. How to empty your active drain 1. Make sure that you have a measuring cup that you can empty your drainage into. 2. Wash your hands with soap and water. If soap and water are not available, use hand sanitizer. 3. Loosen   any pins or clips that hold the tube in place. 4. If your health care provider tells you to strip the tube to prevent clots and tube blockages: ? Hold the tube at the skin with one hand. Use your other hand to pinch the tubing with your thumb and first finger. ? Gently move your fingers down the tube while squeezing very lightly. This clears any drainage, clots, or tissue from the tube. ? You may need to do this several times each day to keep the tube clear. Do not pull on the tube. 5. Open the bulb cap or the drain plug. Do not touch the inside  of the cap or the bottom of the plug. 6. Turn the device upside down and gently squeeze. 7. Empty all of the drainage into the measuring cup. 8. Compress the bulb or the container and replace the cap or the plug. To compress the bulb or the container, squeeze it firmly in the middle while you close the cap or plug the container. 9. Write down the amount of drainage that you have in each 24-hour period. If you have less than 2 Tbsp (30 mL) of drainage during 24 hours, contact your health care provider. 10. Flush the drainage down the toilet. 11. Wash your hands with soap and water.   Contact a health care provider if:  You have redness, swelling, or pain around your drain area.  You have pus or a bad smell coming from your drain area.  You have a fever or chills.  The skin around your drain is warm to the touch.  The amount of drainage that you have is increasing instead of decreasing.  You have drainage that is cloudy.  There is a sudden stop or a sudden decrease in the amount of drainage that you have.  Your drain tube falls out.  Your active drain does not stay compressed after you empty it. Summary  Surgical drains are used to remove extra fluid that normally builds up in a surgical wound after surgery.  Different kinds of surgical drains include active drains and passive drains. Active drains use suction to pull drainage away from the surgical wound, and passive drains allow fluid to drain naturally.  It is important to care for your drain to prevent infection. If your drain is placed at your back, or any other hard-to-reach area, ask another person to assist you.  Contact your health care provider if you have redness, swelling, or pain around your drain area. This information is not intended to replace advice given to you by your health care provider. Make sure you discuss any questions you have with your health care provider. Document Revised: 05/07/2018 Document Reviewed:  05/07/2018 Elsevier Patient Education  2021 [place] Office Phone Number [phone]  BREAST BIOPSY/ PARTIAL MASTECTOMY: POST OP INSTRUCTIONS  Always review your discharge instruction sheet given to you by the facility where your surgery was performed.  IF YOU HAVE DISABILITY OR FAMILY LEAVE FORMS, YOU MUST BRING THEM TO THE OFFICE FOR PROCESSING.  DO NOT GIVE THEM TO YOUR DOCTOR.  1. A prescription for pain medication may be given to you upon discharge.  Take your pain medication as prescribed, if needed.  If narcotic pain medicine is not needed, then you may take acetaminophen (Tylenol) or ibuprofen (Advil) as needed. 2. Take your usually prescribed medications unless otherwise directed 3. If you need a refill on your pain medication, please contact your pharmacy.  They will contact  our office to request authorization.  Prescriptions will not be filled after 5pm or on week-ends. 4. You should eat very light the first 24 hours after surgery, such as soup, crackers, pudding, etc.  Resume your normal diet the day after surgery. 5. Most patients will experience some swelling and bruising in the breast.  Ice packs and a good support bra will help.  Swelling and bruising can take several days to resolve.  6. It is common to experience some constipation if taking pain medication after surgery.  Increasing fluid intake and taking a stool softener will usually help or prevent this problem from occurring.  A mild laxative (Milk of Magnesia or Miralax) should be taken according to package directions if there are no bowel movements after 48 hours. 7. Unless discharge instructions indicate otherwise, you may remove your bandages 24-48 hours after surgery, and you may shower at that time.  You may have steri-strips (small skin tapes) in place directly over the incision.  These strips should be left on the skin for 7-10 days.  If your surgeon used skin glue on the incision, you  may shower in 24 hours.  The glue will flake off over the next 2-3 weeks.  Any sutures or staples will be removed at the office during your follow-up visit. 8. ACTIVITIES:  You may resume regular daily activities (gradually increasing) beginning the next day.  Wearing a good support bra or sports bra minimizes pain and swelling.  You may have sexual intercourse when it is comfortable. a. You may drive when you no longer are taking prescription pain medication, you can comfortably wear a seatbelt, and you can safely maneuver your car and apply brakes. b. RETURN TO WORK:  ______________________________________________________________________________________ 9. You should see your doctor in the office for a follow-up appointment approximately two weeks after your surgery.  Your doctor's nurse will typically make your follow-up appointment when she calls you with your pathology report.  Expect your pathology report 2-3 business days after your surgery.  You may call to check if you do not hear from [place] after three days. 10. OTHER INSTRUCTIONS: _______________________________________________________________________________________________ _____________________________________________________________________________________________________________________________________ _____________________________________________________________________________________________________________________________________ _____________________________________________________________________________________________________________________________________  WHEN TO CALL YOUR DOCTOR: 1. Fever over 101.0 2. Nausea and/or vomiting. 3. Extreme swelling or bruising. 4. Continued bleeding from incision. 5. Increased pain, redness, or drainage from the incision.  The clinic staff is available to answer your questions during regular business hours.  Please don't hesitate to call and ask to speak to one of the nurses for clinical  concerns.  If you have a medical emergency, go to the nearest emergency room or call 911.  A surgeon from [facility] is always on call at the hospital.  For further questions, please visit [email or web address]   No Tylenol or ibuprofen until after 1pm today if needed   Post Anesthesia Home Care Instructions  Activity: Get plenty of rest for the remainder of the day. A responsible individual must stay with you for 24 hours following the procedure.  For the next 24 hours, DO NOT: -Drive a car -Paediatric nurse -Drink alcoholic beverages -Take any medication unless instructed by your physician -Make any legal decisions or sign important papers.  Meals: Start with liquid foods such as gelatin or soup. Progress to regular foods as tolerated. Avoid greasy, spicy, heavy foods. If nausea and/or vomiting occur, drink only clear liquids until the nausea and/or vomiting subsides. Call your physician if vomiting continues.  Special Instructions/Symptoms: Your throat may feel dry or sore  from the anesthesia or the breathing tube placed in your throat during surgery. If this causes discomfort, gargle with warm salt water. The discomfort should disappear within 24 hours.  If you had a scopolamine patch placed behind your ear for the management of post- operative nausea and/or vomiting:  1. The medication in the patch is effective for 72 hours, after which it should be removed.  Wrap patch in a tissue and discard in the trash. Wash hands thoroughly with soap and water. 2. You may remove the patch earlier than 72 hours if you experience unpleasant side effects which may include dry mouth, dizziness or visual disturbances. 3. Avoid touching the patch. Wash your hands with soap and water after contact with the patch.    About my Jackson-Pratt Bulb Drain  What is a Jackson-Pratt bulb? A Jackson-Pratt is a soft, round device used to collect drainage. It is connected to a long, thin  drainage catheter, which is held in place by one or two small stiches near your surgical incision site. When the bulb is squeezed, it forms a vacuum, forcing the drainage to empty into the bulb.  Emptying the Jackson-Pratt bulb- To empty the bulb: 1. Release the plug on the top of the bulb. 2. Pour the bulb's contents into a measuring container which your nurse will provide. 3. Record the time emptied and amount of drainage. Empty the drain(s) as often as your     doctor or nurse recommends.  Date                  Time                    Amount (Drain 1)                 Amount (Drain 2)  _____________________________________________________________________  _____________________________________________________________________  _____________________________________________________________________  _____________________________________________________________________  _____________________________________________________________________  _____________________________________________________________________  _____________________________________________________________________  _____________________________________________________________________  Squeezing the Jackson-Pratt Bulb- To squeeze the bulb: 1. Make sure the plug at the top of the bulb is open. 2. Squeeze the bulb tightly in your fist. You will hear air squeezing from the bulb. 3. Replace the plug while the bulb is squeezed. 4. Use a safety pin to attach the bulb to your clothing. This will keep the catheter from     pulling at the bulb insertion site.  When to call your doctor- Call your doctor if:  Drain site becomes red, swollen or hot.  You have a fever greater than 101 degrees F.  There is oozing at the drain site.  Drain falls out (apply a guaze bandage over the drain hole and secure it with tape).  Drainage increases daily not related to activity patterns. (You will usually have more drainage when you are  active than when you are resting.)  Drainage has a bad odor.

## 2020-05-17 NOTE — Progress Notes (Signed)
Assisted nuc med tech with nuc med injections. Side rails up, monitors on throughout procedure. See vital signs in flow sheet. Tolerated Procedure well. 

## 2020-05-17 NOTE — Interval H&P Note (Signed)
History and Physical Interval Note:  05/17/2020 7:17 AM  Michelle Anthony  has presented today for surgery, with the diagnosis of RIGHT BREAST CANCER.  The various methods of treatment have been discussed with the patient and family. After consideration of risks, benefits and other options for treatment, the patient has consented to  Procedure(s) with comments: RIGHT BREAST LUMPECTOMY WITH RADIOACTIVE SEED x3 AND SENTINEL LYMPH NODE MAPPING (Right) - PEC BLOCK as a surgical intervention.  The patient's history has been reviewed, patient examined, no change in status, stable for surgery.  I have reviewed the patient's chart and labs.  Questions were answered to the patient's satisfaction.     [employer]

## 2020-05-17 NOTE — Op Note (Signed)
Preoperative diagnosis: Stage II right breast cancer upper outer quadrant  Postoperative diagnosis: Same  Procedure: Right breast seed localized lumpectomy utilizing 5 seeds and right axillary sentinel lymph node mapping  Surgeon: Erroll Luna, MD  Anesthesia: LMA with local as well as pectoral block per anesthesia  EBL: 30 cc  Specimen: Right breast tissue containing all 5 seeds in all 5 clips oriented with ink verified by the Faxitron in radiology and sent to pathology +2 sentinel nodes  Drains: 19 round drain to lumpectomy cavity  IV fluids: Per anesthesia record  Indications for procedure: The patient is a 55 year old female with a 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  She has received neoadjuvant chemotherapy with good reduction in size and presents today for breast conserving surgery.  We discussed options of breast conserving surgery versus mastectomy and reconstruction.  Given her good response to chemotherapy, she wished to proceed with breast conserving surgery initially understanding if this fails she may require mastectomy.  We also discussed sentinel lymph node mapping and the risk of bleeding, infection, nerve injury, lymphedema, and the need for other treatments and/or procedures.  We discussed potential cosmetic issues given the large area were excising.  She understood all the above but understood that at this failed she would require mastectomy but was motivated to attempt breast conserving surgery and given her MRI changes felt was reasonable to at least consider this.The procedure has been discussed with the patient. Alternatives to surgery have been discussed with the patient.  Risks of surgery include bleeding,  Infection,  Seroma formation, death,  and the need for further surgery.   The patient understands and wishes to proceed.   Description of procedure: The patient was met in the holding area and questions were answered.  She underwent  pectoral block per anesthesia on the right as well as injection of technetium sulfur colloid.  Neoprobe used to verify seed locations and films were available and the film was discussed with the radiologist yesterday.  All questions were answered.  She was brought back to the operating room.  She is placed supine upon the OR table.  After induction of general anesthesia, the right breast was prepped and draped in a sterile fashion and timeout performed.  Proper patient, site and procedure were verified.  Films were available for review.  A transverse incision was made along the lateral aspect of the breast.  Neoprobe was used to guide [place] to find all 5 seeds and excise all the tissue within those 5 seeds.  The specimen was removed with grossly negative margins.  The Faxitron image revealed all 5 seeds to be present and this was verified by radiologist.  This was inked and sent to pathology.  I then mobilized the breast tissue circumferentially given the defect.  This was done with cautery.  Given the large resectional area I opted to place a 19 round drain below the closure and secured with a 2-0 nylon.  I then closed the breast in layers with a deep layer 3-0 Vicryl and 4-0 Monocryl.  The neoprobe settings were changed [place].  Incision was made in the right axilla approximately 2 inches.  Dissection was carried down into the level 1 axillary contents.  There were 2 hot nodes identified and removed.  Background counts approached 0 and there was no gross adenopathy.  The long thoracic nerve, thoracodorsal trunk and axillary vein were preserved.  Irrigation was used and this was hemostatic.  Wound closed with 3-0  Vicryl for Monocryl.  Dermabond applied to both incisions.  Breast binder placed.  Drain placed to bulb suction.  All counts were found to be correct.  Patient was awoke extubated taken recovery in satisfactory condition.

## 2020-05-17 NOTE — Progress Notes (Signed)
Assisted Dr. Oddono with right, ultrasound guided, pectoralis block. Side rails up, monitors on throughout procedure. See vital signs in flow sheet. Tolerated Procedure well. 

## 2020-05-17 NOTE — Anesthesia Procedure Notes (Signed)
Anesthesia Regional Block: Pectoralis block   Pre-Anesthetic Checklist: ,, timeout performed, Correct Patient, Correct Site, Correct Laterality, Correct Procedure, Correct Position, site marked, Risks and benefits discussed,  Surgical consent,  Pre-op evaluation,  At surgeon's request and post-op pain management  Laterality: Right  Prep: chloraprep       Needles:  Injection technique: Single-shot  Needle Type: Echogenic Stimulator Needle     Needle Length: 5cm  Needle Gauge: 22     Additional Needles:   Procedures:, nerve stimulator,,, ultrasound used (permanent image in chart),,,,  Narrative:  Start time: 05/17/2020 6:50 AM End time: 05/17/2020 6:55 AM Injection made incrementally with aspirations every 5 mL.  Performed by: Personally  Anesthesiologist: Janeece Riggers, MD  Additional Notes: Functioning IV was confirmed and monitors were applied.  A 37mm 22ga Arrow echogenic stimulator needle was used. Sterile prep and drape,hand hygiene and sterile gloves were used. Ultrasound guidance: relevant anatomy identified, needle position confirmed, local anesthetic spread visualized around nerve(s)., vascular puncture avoided.  Image printed for medical record. Negative aspiration and negative test dose prior to incremental administration of local anesthetic. The patient tolerated the procedure well.

## 2020-05-18 ENCOUNTER — Encounter (HOSPITAL_BASED_OUTPATIENT_CLINIC_OR_DEPARTMENT_OTHER): Payer: Self-pay | Admitting: Surgery

## 2020-05-20 ENCOUNTER — Other Ambulatory Visit: Payer: Self-pay | Admitting: *Deleted

## 2020-05-20 DIAGNOSIS — C50411 Malignant neoplasm of upper-outer quadrant of right female breast: Secondary | ICD-10-CM

## 2020-05-20 DIAGNOSIS — Z170 Estrogen receptor positive status [ER+]: Secondary | ICD-10-CM

## 2020-05-20 LAB — SURGICAL PATHOLOGY

## 2020-05-23 ENCOUNTER — Encounter: Payer: Self-pay | Admitting: *Deleted

## 2020-05-24 ENCOUNTER — Telehealth: Payer: Self-pay | Admitting: *Deleted

## 2020-05-24 NOTE — Progress Notes (Signed)
HEMATOLOGY-ONCOLOGY [facility] VIDEO VISIT PROGRESS NOTE  I connected with Michelle Anthony on 05/25/2020 at  2:30 PM EST by MyChart video conference and verified that I am speaking with the correct person using two identifiers.  I discussed the limitations, risks, security and privacy concerns of performing an evaluation and management service by MyChart and the availability of in person appointments.  I also discussed with the patient that there may be a patient responsible charge related to this service. The patient expressed understanding and agreed to proceed.  Patient's Location: Home Physician Location: Clinic  CHIEF COMPLIANT: Follow-up s/p lumpectomy   INTERVAL HISTORY: Michelle Anthony is a 55 y.o. female with above-mentioned history of right breast cancer who completed neoadjuvant chemotherapy and is currently on Herceptin Perjeta maintenance. She underwent a right lumpectomy on 05/17/20 with Dr. Brantley Stage for which pathology showed scattered foci of residual invasive lobular carcinoma, 0.6-1.0cm, grade 2, clear margins, 2 right axillary lymph nodes negative for carcinoma. She presents over MyChart todayto review the pathology report.  Oncology History  Malignant neoplasm of upper-outer quadrant of right breast in female, estrogen receptor positive ([place])  12/16/2019 Initial Diagnosis   Screening mammogram  showed a right breast asymmetry. Diagnostic mammogram showed a 2.9cm mass at the 9 o'clock position with 2 adjacent masses at the 10 and 8 o'clock positions, 0.9cm and 1.4cm respectively, and an enlarged lymph node, 1.3cm.  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 The report date is 12/31/2019.   The Multi-Cancer  Panel offered by [facility] includes sequencing and/or deletion duplication testing of the following 85 genes: AIP, ALK, APC, ATM, AXIN2,BAP1,  BARD1, BLM, BMPR1A, BRCA1, BRCA2, BRIP1, CASR, CDC73, CDH1, CDK4, CDKN1B, CDKN1C, CDKN2A (p14ARF), CDKN2A (p16INK4a), CEBPA, CHEK2, CTNNA1, DICER1, DIS3L2, EGFR (c.2369C>T, p.Thr790Met variant only), EPCAM (Deletion/duplication testing only), FH, FLCN, GATA2, GPC3, GREM1 (Promoter region deletion/duplication testing only), HOXB13 (c.251G>A, p.Gly84Glu), HRAS, KIT, MAX, MEN1, MET, MITF (c.952G>A, p.Glu318Lys variant only), MLH1, MSH2, MSH3, MSH6, MUTYH, NBN, NF1, NF2, NTHL1, PALB2, PDGFRA, PHOX2B, PMS2, POLD1, POLE, POT1, PRKAR1A, PTCH1, PTEN, RAD50, RAD51C, RAD51D, RB1, RECQL4, RET, RNF43, RUNX1, SDHAF2, SDHA (sequence changes only), SDHB, SDHC, SDHD, SMAD4, SMARCA4, SMARCB1, SMARCE1, STK11, SUFU, TERC, TERT, TMEM127, TP53, TSC1, TSC2, VHL, WRN and WT1.   01/06/2020 - 04/22/2020 Chemotherapy         05/11/2020 -  Chemotherapy      Patient is on Antibody Plan: BREAST TRASTUZUMAB + PERTUZUMAB Q21D    05/17/2020 Surgery   Right lumpectomy ([facility]): scattered foci of residual invasive lobular carcinoma, 0.6-1.0cm, grade 2, clear margins, 2 right axillary lymph nodes negative for carcinoma.     Observations/Objective:  There were no vitals filed for this visit. There is no height or weight on file to calculate BMI.  I have reviewed the data as listed CMP Latest Ref Rng & Units 05/11/2020 04/20/2020 03/30/2020  Glucose 70 - 99 mg/dL 83 102(H) 102(H)  BUN 6 - 20 mg/dL _0 Creatinine 0.44 - 1.00 mg/dL 0.74 0.67 0.66  Sodium 135 - 145 mmol/L 141 141 140  Potassium 3.5 - 5.1 mmol/L 3.6 3.9 3.9  Chloride 98 - 111 mmol/L 106 107 106  CO2 22 - 32 mmol/L 28 26 26  Calcium 8.9 - 10.3 mg/dL 9.0 9.5 9.8  Total Protein 6.5 - 8.1 g/dL 5.9(L) 7.0 6.8  Total Bilirubin 0.3 - 1.2 mg/dL 0.4 0.4 0.5  Alkaline Phos 38 - 126 U/L 55 68 72  AST 15 - 41 U/L 19 19 21  ALT 0 -  44 U/L 25 35 32    Lab Results  Component Value Date   WBC 7.7 05/11/2020   HGB 11.0 (L) 05/11/2020   HCT 34.3 (L) 05/11/2020   MCV 102.1 (H) 05/11/2020   PLT 223 05/11/2020   NEUTROABS 4.3 05/11/2020      Assessment Plan:  Malignant neoplasm of upper-outer quadrant of right breast in female, estrogen receptor positive ([facility]) 12/09/2019:Screening mammogram showed a right breast asymmetry. Diagnostic mammogram showed a 2.9cm mass at the 9 o'clock position with 2 adjacent masses at the 10 and 8 o'clock positions, 0.9cm and 1.4cm respectively, and an enlarged lymph node, 1.3cm. 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 Neoadjuvant chemotherapy with TCH Perjeta 6 cycles followed by Herceptinand Perjeta versus Kadcylamaintenance for 1 year(also could be randomized compass HER 2 trial) 2. Followed by breast conserving surgery if possible with sentinel lymph node study 3. Followed by adjuvant radiation therapy if patient had lumpectomy 4.Followed by adjuvant antiestrogen therapy SWOG S 1714 neuropathy clinical trial: No adverse effects from participating in the trial 12/31/2019: Breast MRI: Known malignancy 3.1 cm, with non-mass enhancement total measuring 4.2 cm. Additional masses 6 mm and 3 and 4 mm MRI guided biopsy on the 2 additional masses:Invasive lobular cancer with LCIS, retroareolar: LCIS grade 2 --------------------------------------------------------------------------------------------------------------------------------------------- 05/17/20: Right lumpectomy ([facility]): scattered foci of residual invasive lobular carcinoma, 0.6-1.0cm, grade 2, clear margins, 2 right axillary lymph nodes negative for carcinoma.  Pathology counseling: I discussed the final pathology report of the patient provided  a copy of this report. I discussed the  margins as well as lymph node surgeries. We also discussed the final staging along with previously performed ER/PR and HER-2/neu testing.  Recommendation: Kadcyla Maintenance   I discussed the assessment and treatment plan with the patient. The patient was provided an opportunity to ask questions and all were answered. The patient agreed with the plan and demonstrated an understanding of the instructions. The patient was advised to call back or seek an in-person evaluation if the symptoms worsen or if the condition fails to improve as anticipated.   Total time spent: 30 minutes including face-to-face MyChart video visit time and time spent for planning, charting and coordination of care  Vinay K Gudena, MD 05/25/2020   I, Molly Dorshimer, am acting as scribe for Vinay Gudena, MD.  I have reviewed the above documentation for accuracy and completeness, and I agree with the above.   

## 2020-05-24 NOTE — Telephone Encounter (Signed)
Confirmed virtual visit with Dr. Lindi Adie on 2/9.

## 2020-05-25 ENCOUNTER — Other Ambulatory Visit: Payer: Self-pay | Admitting: *Deleted

## 2020-05-25 ENCOUNTER — Inpatient Hospital Stay: Payer: BC Managed Care – PPO | Attending: Hematology and Oncology | Admitting: Hematology and Oncology

## 2020-05-25 DIAGNOSIS — C50411 Malignant neoplasm of upper-outer quadrant of right female breast: Secondary | ICD-10-CM | POA: Diagnosis not present

## 2020-05-25 DIAGNOSIS — Z170 Estrogen receptor positive status [ER+]: Secondary | ICD-10-CM

## 2020-05-25 DIAGNOSIS — G629 Polyneuropathy, unspecified: Secondary | ICD-10-CM | POA: Diagnosis not present

## 2020-05-25 DIAGNOSIS — Z5112 Encounter for antineoplastic immunotherapy: Secondary | ICD-10-CM | POA: Diagnosis not present

## 2020-05-25 NOTE — Progress Notes (Signed)
DISCONTINUE ON PATHWAY REGIMEN - Breast     A cycle is every 21 days:     Pertuzumab      Pertuzumab      Trastuzumab-xxxx      Trastuzumab-xxxx      Carboplatin      Docetaxel   **Always confirm dose/schedule in your pharmacy ordering system**  REASON: Other Reason PRIOR TREATMENT: [id number]: Docetaxel + Carboplatin + Trastuzumab IV + Pertuzumab IV (TCHP IV) q21 Days x 6 Cycles TREATMENT RESPONSE: Partial Response (PR)  START ON PATHWAY REGIMEN - Breast     A cycle is every 21 days:     Ado-trastuzumab emtansine   **Always confirm dose/schedule in your pharmacy ordering system**  Patient Characteristics: Post-Neoadjuvant Therapy and Resection, HER2 Positive, ER Positive, Residual Disease, Adjuvant Targeted Therapy After Neoadjuvant Chemo/Targeted Therapy Therapeutic Status: Post-Neoadjuvant Therapy and Resection ER Status: Positive (+) HER2 Status: Positive (+) PR Status: Positive (+) Residual Invasive Disease Post-Neoadjuvant Therapy<= Yes Intent of Therapy: Curative Intent, Discussed with Patient 

## 2020-05-25 NOTE — Assessment & Plan Note (Signed)
12/09/2019:Screening mammogram showed a right breast asymmetry. Diagnostic mammogram showed a 2.9cm mass at the 9 o'clock position with 2 adjacent masses at the 10 and 8 o'clock positions, 0.9cm and 1.4cm respectively, and an enlarged lymph node, 1.3cm. 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 Neoadjuvant chemotherapy with TCH Perjeta 6 cycles followed by Herceptinand Perjeta versus Kadcylamaintenance for 1 year(also could be randomized compass HER 2 trial) 2. Followed by breast conserving surgery if possible with sentinel lymph node study 3. Followed by adjuvant radiation therapy if patient had lumpectomy 4.Followed by adjuvant antiestrogen therapy SWOG S 1714 neuropathy clinical trial: No adverse effects from participating in the trial 12/31/2019: Breast MRI: Known malignancy 3.1 cm, with non-mass enhancement total measuring 4.2 cm. Additional masses 6 mm and 3 and 4 mm MRI guided biopsy on the 2 additional masses:Invasive lobular cancer with LCIS, retroareolar: LCIS grade 2 --------------------------------------------------------------------------------------------------------------------------------------------- 05/17/20: Right lumpectomy ([facility]): scattered foci of residual invasive lobular carcinoma, 0.6-1.0cm, grade 2, clear margins, 2 right axillary lymph nodes negative for carcinoma.  Pathology counseling: I discussed the final pathology report of the patient provided  a copy of this report. I discussed the margins as well as lymph node surgeries. We also discussed the final staging along with previously performed ER/PR and HER-2/neu testing.   Recommendation: Kadcyla Maintenance

## 2020-05-27 ENCOUNTER — Encounter: Payer: Self-pay | Admitting: *Deleted

## 2020-06-01 NOTE — Progress Notes (Signed)
Pharmacist Chemotherapy Monitoring - Follow Up Assessment    I verify that I have reviewed each item in the below checklist:  . Regimen for the patient is scheduled for the appropriate day and plan matches scheduled date. [name] Appropriate non-routine labs are ordered dependent on drug ordered. . If applicable, additional medications reviewed and ordered per protocol based on lifetime cumulative doses and/or treatment regimen.   Plan for follow-up and/or issues identified: No . I-vent associated with next due treatment: No . MD and/or nursing notified: No  Acquanetta Belling, RPH, BCPS, BCOP 06/01/2020  10:39 AM

## 2020-06-01 NOTE — Assessment & Plan Note (Signed)
12/09/2019:Screening mammogram showed a right breast asymmetry. Diagnostic mammogram showed a 2.9cm mass at the 9 o'clock position with 2 adjacent masses at the 10 and 8 o'clock positions, 0.9cm and 1.4cm respectively, and an enlarged lymph node, 1.3cm. 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 Neoadjuvant chemotherapy with TCH Perjeta 6 cycles followed by Herceptinand Perjeta versus Kadcylamaintenance for 1 year(also could be randomized compass HER 2 trial) 2. Followed by breast conserving surgery if possible with sentinel lymph node study 3. Followed by adjuvant radiation therapy if patient had lumpectomy 4.Followed by adjuvant antiestrogen therapy SWOG S 1714 neuropathy clinical trial: No adverse effects from participating in the trial 12/31/2019: Breast MRI: Known malignancy 3.1 cm, with non-mass enhancement total measuring 4.2 cm. Additional masses 6 mm and 3 and 4 mm MRI guided biopsy on the 2 additional masses:Invasive lobular cancer with LCIS, retroareolar: LCIS grade 2 --------------------------------------------------------------------------------------------------------------------------------------------- 05/17/20: Right lumpectomy ([facility]): scattered foci of residual invasive lobular carcinoma, 0.6-1.0cm, grade 2, clear margins, 2 right axillary lymph nodes negative for carcinoma.   Current treatment: Kadcyla Maintenance, today cycle 1 Labs reviewed  Return to clinic in 3 weeks for cycle 2

## 2020-06-01 NOTE — Progress Notes (Signed)
Patient Care Team: Katherina Mires, MD as PCP - General (Family Medicine) Rockwell Germany, RN as Oncology Nurse Navigator Mauro Kaufmann, RN as Oncology Nurse Navigator Erroll Luna, MD as Consulting Physician (General Surgery) Nicholas Lose, MD as Consulting Physician (Hematology and Oncology) Gery Pray, MD as Consulting Physician (Radiation Oncology)  DIAGNOSIS:    ICD-10-CM   1. Malignant neoplasm of upper-outer quadrant of right breast in female, estrogen receptor positive ([place])  C50.411    Z17.0     SUMMARY OF ONCOLOGIC HISTORY: Oncology History  Malignant neoplasm of upper-outer quadrant of right breast in female, estrogen receptor positive ([place])  12/16/2019 Initial Diagnosis   Screening mammogram  showed a right breast asymmetry. Diagnostic mammogram showed a 2.9cm mass at the 9 o'clock position with 2 adjacent masses at the 10 and 8 o'clock positions, 0.9cm and 1.4cm respectively, and an enlarged lymph node, 1.3cm.  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 The report date is 12/31/2019.   The Multi-Cancer Panel offered by [facility] includes sequencing and/or deletion duplication testing of the following 85 genes: AIP, ALK, APC, ATM, AXIN2,BAP1,  BARD1, BLM, BMPR1A, BRCA1, BRCA2, BRIP1, CASR, CDC73, CDH1, CDK4, CDKN1B, CDKN1C, CDKN2A (p14ARF), CDKN2A (p16INK4a), CEBPA, CHEK2, CTNNA1, DICER1, DIS3L2, EGFR (c.2369C>T, p.Thr790Met variant only), EPCAM (Deletion/duplication testing only), FH, FLCN, GATA2, GPC3, GREM1 (Promoter region deletion/duplication testing only), HOXB13 (c.251G>A, p.Gly84Glu), HRAS, KIT, MAX, MEN1, MET, MITF (c.952G>A, p.Glu318Lys variant only), MLH1, MSH2, MSH3, MSH6, MUTYH, NBN, NF1,  NF2, NTHL1, PALB2, PDGFRA, PHOX2B, PMS2, POLD1, POLE, POT1, PRKAR1A, PTCH1, PTEN, RAD50, RAD51C, RAD51D, RB1, RECQL4, RET, RNF43, RUNX1, SDHAF2, SDHA (sequence changes only), SDHB, SDHC, SDHD, SMAD4, SMARCA4, SMARCB1, SMARCE1, STK11, SUFU, TERC, TERT, TMEM127, TP53, TSC1, TSC2, VHL, WRN and WT1.   01/06/2020 - 04/22/2020 Chemotherapy   Neoadjuvant chemo with TCH Perjeta x6 cycles       05/17/2020 Surgery   Right lumpectomy ([facility]): scattered foci of residual invasive lobular carcinoma, 0.6-1.0cm, grade 2, clear margins, 2 right axillary lymph nodes negative for carcinoma.   06/02/2020 -  Chemotherapy    Patient is on Treatment Plan: BREAST ADO-TRASTUZUMAB EMTANSINE (KADCYLA) Q21D        CHIEF COMPLIANT: Kadcyla maintenance  INTERVAL HISTORY: Michelle Anthony is a 55 y.o. with above-mentioned history of right breast cancer treated with neoadjuvant chemotherapy, lumpectomy, and is here to receive Kadcyla maintenance. She presents to the clinic todayfor treatment.  She had a remarkable response to initial chemotherapy but did not get a complete response and therefore we are initiating Kadcyla.  ALLERGIES:  has No Known Allergies.  MEDICATIONS:  Current Outpatient Medications  Medication Sig Dispense Refill  . COLLAGEN PO Apply topically daily.    . Multiple Vitamin (MULTIVITAMIN) tablet Take 1 tablet by mouth daily.    . Tretinoin (RETIN-A EX) Apply topically.     No current facility-administered medications for this visit.    PHYSICAL EXAMINATION: ECOG PERFORMANCE STATUS: 1 - Symptomatic but completely ambulatory  Vitals:   06/02/20 0830  BP: 121/64  Pulse: 84  Resp: 20  Temp: (!) 97.5 F (36.4 C)  SpO2: 100%   Filed Weights   06/02/20 0830  Weight: 145 lb 3.2 oz (65.9 kg)  LABORATORY DATA:  I have reviewed the data as listed CMP Latest Ref Rng & Units 05/11/2020 04/20/2020 03/30/2020  Glucose 70 - 99 mg/dL 83 102(H) 102(H)  BUN 6 - 20 mg/dL 15 14 16   Creatinine  0.44 - 1.00 mg/dL 0.74 0.67 0.66  Sodium 135 - 145 mmol/L 141 141 140  Potassium 3.5 - 5.1 mmol/L 3.6 3.9 3.9  Chloride 98 - 111 mmol/L 106 107 106  CO2 22 - 32 mmol/L 28 26 26   Calcium 8.9 - 10.3 mg/dL 9.0 9.5 9.8  Total Protein 6.5 - 8.1 g/dL 5.9(L) 7.0 6.8  Total Bilirubin 0.3 - 1.2 mg/dL 0.4 0.4 0.5  Alkaline Phos 38 - 126 U/L 55 68 72  AST 15 - 41 U/L 19 19 21   ALT 0 - 44 U/L 25 35 32    Lab Results  Component Value Date   WBC 5.3 06/02/2020   HGB 11.4 (L) 06/02/2020   HCT 34.4 (L) 06/02/2020   MCV 97.5 06/02/2020   PLT 218 06/02/2020   NEUTROABS 3.3 06/02/2020    ASSESSMENT & PLAN:  Malignant neoplasm of upper-outer quadrant of right breast in female, estrogen receptor positive ([place]) 12/09/2019:Screening mammogram showed a right breast asymmetry. Diagnostic mammogram showed a 2.9cm mass at the 9 o'clock position with 2 adjacent masses at the 10 and 8 o'clock positions, 0.9cm and 1.4cm respectively, and an enlarged lymph node, 1.3cm. 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 Neoadjuvant chemotherapy with TCH Perjeta 6 cycles followed by Herceptinand Perjeta versus Kadcylamaintenance for 1 year(also could be randomized compass HER 2 trial) 2. Followed by breast conserving surgery if possible with sentinel lymph node study 3. Followed by adjuvant radiation therapy if patient had lumpectomy 4.Followed by adjuvant antiestrogen therapy SWOG S 1714 neuropathy clinical trial: No adverse effects from participating in the trial 12/31/2019: Breast MRI: Known malignancy 3.1 cm, with non-mass enhancement total measuring 4.2 cm. Additional masses 6 mm and 3 and 4 mm MRI guided biopsy on the 2 additional masses:Invasive lobular cancer with LCIS, retroareolar: LCIS grade  2 --------------------------------------------------------------------------------------------------------------------------------------------- 05/17/20: Right lumpectomy ([facility]): scattered foci of residual invasive lobular carcinoma, 0.6-1.0cm, grade 2, clear margins, 2 right axillary lymph nodes negative for carcinoma.   Current treatment: Kadcyla Maintenance, today cycle 1 Labs reviewed  Peripheral neuropathy: Numbness of the tips of her toes and tips of the fingers. We will need to monitor this closely. Return to clinic in 3 weeks for cycle 2    No orders of the defined types were placed in this encounter.  The patient has a good understanding of the overall plan. she agrees with it. she will call with any problems that may develop before the next visit here.  Total time spent: 30 mins including face to face time and time spent for planning, charting and coordination of care  Rulon Eisenmenger, MD, MPH 06/02/2020  I, Cloyde Reams Dorshimer, am acting as scribe for Dr. Nicholas Lose.  I have reviewed the above documentation for accuracy and completeness, and I agree with the above.

## 2020-06-02 ENCOUNTER — Inpatient Hospital Stay: Payer: BC Managed Care – PPO

## 2020-06-02 ENCOUNTER — Other Ambulatory Visit: Payer: Self-pay

## 2020-06-02 ENCOUNTER — Inpatient Hospital Stay: Payer: BC Managed Care – PPO | Admitting: Hematology and Oncology

## 2020-06-02 VITALS — BP 100/52 | HR 66 | Temp 98.2°F | Resp 18

## 2020-06-02 DIAGNOSIS — Z170 Estrogen receptor positive status [ER+]: Secondary | ICD-10-CM

## 2020-06-02 DIAGNOSIS — C50411 Malignant neoplasm of upper-outer quadrant of right female breast: Secondary | ICD-10-CM

## 2020-06-02 LAB — COMPREHENSIVE METABOLIC PANEL
ALT: 16 U/L (ref 0–44)
AST: 13 U/L — ABNORMAL LOW (ref 15–41)
Albumin: 3.9 g/dL (ref 3.5–5.0)
Alkaline Phosphatase: 66 U/L (ref 38–126)
Anion gap: 11 (ref 5–15)
BUN: 17 mg/dL (ref 6–20)
CO2: 26 mmol/L (ref 22–32)
Calcium: 9 mg/dL (ref 8.9–10.3)
Chloride: 108 mmol/L (ref 98–111)
Creatinine, Ser: 0.72 mg/dL (ref 0.44–1.00)
GFR, Estimated: >60 mL/min (ref >60)
Glucose, Bld: 94 mg/dL (ref 70–99)
Potassium: 3.8 mmol/L (ref 3.5–5.1)
Sodium: 145 mmol/L (ref 135–145)
Total Bilirubin: 0.4 mg/dL (ref 0.3–1.2)
Total Protein: 6.5 g/dL (ref 6.5–8.1)

## 2020-06-02 LAB — CBC WITH DIFFERENTIAL/PLATELET
Abs Immature Granulocytes: 0.01 10*3/uL (ref 0.00–0.07)
Basophils Absolute: 0 10*3/uL (ref 0.0–0.1)
Basophils Relative: 1 %
Eosinophils Absolute: 0.2 10*3/uL (ref 0.0–0.5)
Eosinophils Relative: 3 %
HCT: 34.4 % — ABNORMAL LOW (ref 36.0–46.0)
Hemoglobin: 11.4 g/dL — ABNORMAL LOW (ref 12.0–15.0)
Immature Granulocytes: 0 %
Lymphocytes Relative: 27 %
Lymphs Abs: 1.4 10*3/uL (ref 0.7–4.0)
MCH: 32.3 pg (ref 26.0–34.0)
MCHC: 33.1 g/dL (ref 30.0–36.0)
MCV: 97.5 fL (ref 80.0–100.0)
Monocytes Absolute: 0.3 10*3/uL (ref 0.1–1.0)
Monocytes Relative: 6 %
Neutro Abs: 3.3 10*3/uL (ref 1.7–7.7)
Neutrophils Relative %: 63 %
Platelets: 218 10*3/uL (ref 150–400)
RBC: 3.53 MIL/uL — ABNORMAL LOW (ref 3.87–5.11)
RDW: 13.2 % (ref 11.5–15.5)
WBC: 5.3 10*3/uL (ref 4.0–10.5)
nRBC: 0 % (ref 0.0–0.2)

## 2020-06-02 MED ORDER — SODIUM CHLORIDE 0.9% FLUSH
10.0000 mL | INTRAVENOUS | Status: DC | PRN
  Filled 2020-06-02: qty 10

## 2020-06-02 MED ORDER — DIPHENHYDRAMINE HCL 25 MG PO CAPS
50.0000 mg | ORAL_CAPSULE | Freq: Once | ORAL | Status: AC
  Administered 2020-06-02: 50 mg via ORAL

## 2020-06-02 MED ORDER — ACETAMINOPHEN 325 MG PO TABS
ORAL_TABLET | ORAL | Status: AC
  Filled 2020-06-02: qty 2

## 2020-06-02 MED ORDER — HEPARIN SOD (PORK) LOCK FLUSH 100 UNIT/ML IV SOLN
500.0000 [IU] | Freq: Once | INTRAVENOUS | Status: DC | PRN
  Filled 2020-06-02: qty 5

## 2020-06-02 MED ORDER — SODIUM CHLORIDE 0.9 % IV SOLN
Freq: Once | INTRAVENOUS | Status: AC
  Filled 2020-06-02: qty 250

## 2020-06-02 MED ORDER — SODIUM CHLORIDE 0.9 % IV SOLN
3.6000 mg/kg | Freq: Once | INTRAVENOUS | Status: AC
  Administered 2020-06-02: 240 mg via INTRAVENOUS
  Filled 2020-06-02: qty 8

## 2020-06-02 MED ORDER — DIPHENHYDRAMINE HCL 25 MG PO CAPS
ORAL_CAPSULE | ORAL | Status: AC
  Filled 2020-06-02: qty 2

## 2020-06-02 MED ORDER — ACETAMINOPHEN 325 MG PO TABS
650.0000 mg | ORAL_TABLET | Freq: Once | ORAL | Status: AC
  Administered 2020-06-02: 650 mg via ORAL

## 2020-06-02 NOTE — Patient Instructions (Signed)
Ado-Trastuzumab Emtansine for injection What is this medicine? ADO-TRASTUZUMAB EMTANSINE (ADD oh traz TOO zuh mab em TAN zine) is a monoclonal antibody combined with chemotherapy. It is used to treat breast cancer. This medicine may be used for other purposes; ask your health care provider or pharmacist if you have questions. COMMON BRAND NAME(S): Kadcyla What should I tell my health care provider before I take this medicine? They need to know if you have any of these conditions:  heart disease  heart failure  infection (especially a virus infection such as chickenpox, cold sores, or herpes)  liver disease  lung or breathing disease, like asthma  tingling of the fingers or toes, or other nerve disorder  an unusual or allergic reaction to ado-trastuzumab emtansine, other medications, foods, dyes, or preservatives  pregnant or trying to get pregnant  breast-feeding How should I use this medicine? This medicine is for infusion into a vein. It is given by a health care professional in a hospital or clinic setting. Talk to your pediatrician regarding the use of this medicine in children. Special care may be needed. Overdosage: If you think you have taken too much of this medicine contact a poison control center or emergency room at once. NOTE: This medicine is only for you. Do not share this medicine with others. What if I miss a dose? It is important not to miss your dose. Call your doctor or health care professional if you are unable to keep an appointment. What may interact with this medicine? This medicine may also interact with the following medications:  atazanavir  boceprevir  clarithromycin  delavirdine  indinavir  dalfopristin; quinupristin  isoniazid, INH  itraconazole  ketoconazole  nefazodone  nelfinavir  ritonavir  telaprevir  telithromycin  tipranavir  voriconazole This list may not describe all possible interactions. Give your health care  provider a list of all the medicines, herbs, non-prescription drugs, or dietary supplements you use. Also tell them if you smoke, drink alcohol, or use illegal drugs. Some items may interact with your medicine. What should I watch for while using this medicine? Visit your doctor for checks on your progress. This drug may make you feel generally unwell. This is not uncommon, as chemotherapy can affect healthy cells as well as cancer cells. Report any side effects. Continue your course of treatment even though you feel ill unless your doctor tells you to stop. You may need blood work done while you are taking this medicine. Call your doctor or health care professional for advice if you get a fever, chills or sore throat, or other symptoms of a cold or flu. Do not treat yourself. This drug decreases your body's ability to fight infections. Try to avoid being around people who are sick. Be careful brushing and flossing your teeth or using a toothpick because you may get an infection or bleed more easily. If you have any dental work done, tell your dentist you are receiving this medicine. Avoid taking products that contain aspirin, acetaminophen, ibuprofen, naproxen, or ketoprofen unless instructed by your doctor. These medicines may hide a fever. Do not become pregnant while taking this medicine or for 7 months after stopping it, men with female partners should use contraception during treatment and for 4 months after the last dose. Women should inform their doctor if they wish to become pregnant or think they might be pregnant. There is a potential for serious side effects to an unborn child. Do not breast-feed an infant while taking this medicine or   for 7 months after the last dose. Men who have a partner who is pregnant or who is capable of becoming pregnant should use a condom during sexual activity while taking this medicine and for 4 months after stopping it. Men should inform their doctors if they wish to  father a child. This medicine may lower sperm counts. Talk to your health care professional or pharmacist for more information. What side effects may I notice from receiving this medicine? Side effects that you should report to your doctor or health care professional as soon as possible:  allergic reactions like skin rash, itching or hives, swelling of the face, lips, or tongue  breathing problems  chest pain or palpitations  fever or chills, sore throat  general ill feeling or flu-like symptoms  light-colored stools  nausea, vomiting  pain, tingling, numbness in the hands or feet  signs and symptoms of bleeding such as bloody or black, tarry stools; red or dark-brown urine; spitting up blood or brown material that looks like coffee grounds; red spots on the skin; unusual bruising or bleeding from the eye, gums, or nose  swelling of the legs or ankles  yellowing of the eyes or skin Side effects that usually do not require medical attention (report to your doctor or health care professional if they continue or are bothersome):  changes in taste  constipation  dizziness  headache  joint pain  muscle pain  trouble sleeping  unusually weak or tired This list may not describe all possible side effects. Call your doctor for medical advice about side effects. You may report side effects to FDA at 1-800-FDA-1088. Where should I keep my medicine? This drug is given in a hospital or clinic and will not be stored at home. NOTE: This sheet is a summary. It may not cover all possible information. If you have questions about this medicine, talk to your doctor, pharmacist, or health care provider.  2021 Elsevier/Gold Standard (2017-08-30 10:03:15)

## 2020-06-09 NOTE — Progress Notes (Signed)
Location of Breast Cancer: Right Breast  Histology per Pathology Report: 05/17/2020    FINAL MICROSCOPIC DIAGNOSIS:   A. 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 See comment  - Resection margins are negative for carcinoma; closest are the junction  of medial and inferior margins at 0.1 cm and the posterior margin at 0.3  cm (ribbon clip)  - Lobular carcinoma in situ  - Biopsy site changes  - See oncology table   B. SENTINEL LYMPH NODE, RIGHT AXILLARY, BIOPSY:  - Lymph node, negative for carcinoma (0/1), see comment   C. SENTINEL LYMPH NODE, RIGHT AXILLARY, B, see comment IOPSY:  - Lymph node,negative for carcinoma (0/1), see comment     COMMENT:   A. For the purpose of calculating residual cancer burden, total size of  the lesion from imaging studies before neoadjuvant therapy was used.  Also note that the fibrotic area surrounding lateral coil clip show  residual in situ lobular carcinoma but is negative for invasive  carcinoma.   B and C.  Immunohistochemical stains for cytokeratin AE1/AE3 are  negative for metastatic carcinoma.   ONCOLOGY TABLE:   INVASIVE CARCINOMA OF THE BREAST, STATUS POST NEOADJUVANT TREATMENT:  Resection   Procedure: Lumpectomy  Specimen Laterality: Right  Histologic Type: Invasive lobular carcinoma  Histologic Grade:    Glandular (Acinar)/Tubular Differentiation: 3    Nuclear Pleomorphism: 2    Mitotic Rate: 1    Overall Grade: 2  Tumor Size: Scattered foci of residual invasive lobular carcinoma, grade  2, spanning fibrotic areas of approximately 0.9 (ribbon clip), 1.0 cm  (barbell clip), 0.6 cm (central medial coil clip) and 0.6 cm (cylinder  clip)  Ductal Carcinoma In Situ: Not identified  Tumor Extent:  Limited to breast parenchyma  Treatment Effect in the Breast: Present, probable or definite response  to presurgical therapy  Treatment Effect in Lymph Nodes: Not identified  Margins: All margins negative for invasive carcinoma    Distance from Closest Margin (mm): 1 mm    Specify Closest Margin (required only if <60m): Junction of medial  and inferior margins  Regional Lymph Nodes:    Number of Lymph Nodes Examined: 2    Number of Sentinel Nodes Examined: 2    Number of Lymph Nodes with Macrometastases (>2 mm): 0    Number of Lymph Nodes with Micrometastases: 0    Number of Lymph Nodes with Isolated Tumor Cells (=0.2 mm or =200  cells): 0    Size of Largest Metastatic Deposit (millimeters): Not applicable    Extranodal Extension: Not applicable  Distant Metastasis:    Distant Site(s) Involved: Not applicable  Breast Prognostic Profile (Pre-neoadjuvant case #: [id number])    Estrogen Receptor: 100%, positive, strong staining intensity    Progesterone Receptor: 2%, positive, strong staining intensity    Her2: Positive (FISH)    Ki-67: 20%    Will be repeated on the current case (Block #: A5) and the results  reported in an addendum.  Residual Cancer Burden (RCB):    Primary Tumor Bed: 42 mm x 25 mm    Overall Cancer Cellularity: 10    Percentage of Cancer that is in Situ: 10    Number of Positive Lymph Nodes: 0    Diameter of Largest Lymph Node metastasis: 0  Residual Cancer Burden: 1.679    Residual Cancer Burden Class: RCB-II  Pathologic Stage Classification (pTNM, AJCC 8th Edition): ympT1b, ypN0  Representative Tumor Block: A5  Comment(s): None   ([id number])     GROSS DESCRIPTION:   A: Specimen type: Received fresh and placed in formalin at 9:15 AM on  05/17/2020, labeled right breast tissue with seed x5  Size: 11.5 cm medial to lateral by 8.8 cm superior to inferior by 3.7 cm  anterior to  posterior  Orientation: The specimen is received inked as follows: Anterior- green,  inferior- blue, lateral- orange, medial- yellow, posterior- black,  superior- red.  Localized area: No areas of interest are designated on the specimen. At  the time of gross, the specimen is radiographed and 5 radioactive seeds  are identified and removed per protocol. 5 biopsy clips are noted.  Cut surface: Yellow lobulated adipose tissue with tan-white, dense  fibrous tissue (approximately 70% fibrous). Please note the patient is  status post neoadjuvant therapy. Identified at the more lateral aspect  of the specimen there is a 0.8 cm slightly cystic cavity, filled with  clear gelatinous material and a small silver metallic coil shaped biopsy  clip. Surrounding fibrous tissue is tan-white and dense, without  discrete lesion identified. Within the central aspect of the specimen,  a ribbon and barbell clip are identified, measuring approximately 1.2 cm  apart along the superior to inferior axis. The ribbon clip is  identified within a 0.9 cm slightly nodular area of dense fibrous  tissue, and the barbell clip is adjacent to a 1.0 cm area of hemorrhage.  Within the central to medial aspect of the breast a silver metallic coil  shaped biopsy clip is identified, adjacent to a 0.6 x 0.4 x 0.3 cm area  of tan-yellow, slightly indurated tissue. Within the medial to inferior  aspect of the breast, a silver metallic cylinder biopsy clip is  identified, surrounded by a 0.6 cm area of tan-red, slightly indurated  fibrous tissue.  Margins: Margins are inked as previously stated. The lateral coil clip  measures 0.4 cm to the inferior margin, 0.4 cm to the anterior margin,  2.0 cm to the lateral margin, 2.2 cm from the superior margin. Ribbon  clip measures 0.5 cm to the posterior margin, and the barbell clip  measures 1.8 cm from the posterior margin and 2.5 cm to the anterior  margin. The central medial  coil clip measures 0.3 cm from the posterior  margin. The cylinder clip measures 0.8 cm to theinferior margin, and  0.8 cm to the medial posterior margin.  Prognostic indicators: Obtained from paraffin block if needed  Block summary:  14 blocks submitted  1 = lateral margin, perpendicular  2-3 = sections surrounding lateral coil clip  4-6 = sections surrounding ribbon and barbell clip  7-10 = sections surrounding central medial coil clip  11-13 = sections surrounding cylinder clip  14 = medial margin, perpendicular   B: The specimen is received fresh and consists of a 2.2 x 1.5 x 0.9 cm  tan-pink possible lymph node. The specimen is bisected and entirely  submitted in 2 cassettes.   C: Received fresh in the same container as part B is a 0.9 x 0.9 x 0.6  cm tan-pink possible lymph node. The specimen is bisected and entirely  submitted in 1 cassette. Craig Staggers 05/17/2020)     Final Diagnosis performed by Jaquita Folds, MD.  Electronically  signed 05/20/2020  Technical component performed at [facility].  [facility], [place] [address] [place], [place] [address].  Professional component performed at [facility],  [place] [address], [place], [place] [address].  Immunohistochemistry Technical component (if applicable) was performed  at [facility]. [address] [place],  [place], [place] [address].  IMMUNOHISTOCHEMISTRY DISCLAIMER (if applicable):  Some of these immunohistochemical stains may have been developed and the  performance characteristics determine by [facility]. Some  may not have been cleared or approved by the U.S. Food and Drug  Administration. The FDA has determined that such clearance or approval  is not necessary. This test is used for clinical purposes. It should not  be regarded as investigational or for research. This laboratory is  certified under the [place]   (CLIA-88) as qualified to perform high complexity clinical laboratory  testing. The controls stained appropriately.   ADDENDUM:   PROGNOSTIC INDICATOR RESULTS:   Immunohistochemical and morphometric analysis performed manually   The tumor cells are NEGATIVE for Her2 (1+).   Estrogen Receptor: POSITIVE, 90% MODERATE STAINING INTENSITY  Progesterone Receptor: NEGATIVE  Proliferation Marker Ki-67: 1%   Comment: The negative hormone receptor study(ies) in the case has an  internal positive control.   Reference Range Estrogen and Progesterone Receptor    Negative 0%    Positive >1%   All controls stained appropriately.   Receptor Status:  Estrogen Receptor: POSITIVE, 90% MODERATE STAINING INTENSITY  Progesterone Receptor: NEGATIVE  Proliferation Marker Ki-67: 1%   Did patient present with symptoms (if so, please note symptoms) or was this found on screening mammography?: Mammagram  Past/Anticipated interventions by surgeon, if any: Dr. Erroll Luna  05/17/2020 RIGHT BREAST LUMPECTOMY WITH RADIOACTIVE SEED x5 [place] Past/Anticipated interventions by medical oncology, if any: Chemotherapy   Treatment plan: 1. Neoadjuvant chemotherapy with TCH Perjeta 6 cycles followed by Herceptinand Perjeta versus Kadcylamaintenance for 1 year(also could be randomized compass HER 2 trial) 2. Followed by breast conserving surgery if possible with sentinel lymph node study 3. Followed by adjuvant radiation therapy if patient had lumpectomy 4.Followed by adjuvant antiestrogen therapy SWOG S 1714 neuropathy clinical trial: No adverse effects from participating in the trial 12/31/2019: Breast MRI: Known malignancy 3.1 cm, with non-mass enhancement total measuring 4.2 cm. Additional masses 6 mm and 3 and 4 mm MRI guided biopsy on the 2 additional masses:Invasive lobular cancer with LCIS, retroareolar: LCIS grade 2 Lymphedema issues, if any:    Pain issues, if  any:  None right now  SAFETY ISSUES:  Prior radiation? No  Pacemaker/ICD? No  Possible current pregnancy? No  Is the patient on methotrexate?  Noi Vitals:   06/15/20 0748  BP: 107/61  Pulse: 65  Resp: 20  SpO2: 100%  Weight: 64.2 kg       Sherrlyn Hock, RN 06/09/2020,10:49 AM

## 2020-06-13 ENCOUNTER — Encounter: Payer: Self-pay | Admitting: Physical Therapy

## 2020-06-13 ENCOUNTER — Ambulatory Visit: Payer: BC Managed Care – PPO | Attending: Surgery | Admitting: Physical Therapy

## 2020-06-13 ENCOUNTER — Other Ambulatory Visit: Payer: Self-pay

## 2020-06-13 DIAGNOSIS — Z170 Estrogen receptor positive status [ER+]: Secondary | ICD-10-CM | POA: Diagnosis present

## 2020-06-13 DIAGNOSIS — Z483 Aftercare following surgery for neoplasm: Secondary | ICD-10-CM | POA: Diagnosis present

## 2020-06-13 DIAGNOSIS — C50411 Malignant neoplasm of upper-outer quadrant of right female breast: Secondary | ICD-10-CM | POA: Diagnosis present

## 2020-06-13 NOTE — Patient Instructions (Signed)
            [facility]         [address], [place] [address]         [phone]         Annia Friendly, PT, CLT   After Breast Cancer Class It is recommended you attend the ABC class to be educated on lymphedema risk reduction. This class is free of charge and lasts for 1 hour. It is a 1-time class.  You are scheduled for March 7th at 11:00. We'll send you a link in your e-mail.  Scar massage You can do gentle scar massage using coconut oil for a few minutes each day. 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   Compression garment Wearing a sports bra or compression bra will help get rid of your swelling in your breast, which will likely help reduce your pain.   Home exercise Program You should continue your exercises as you go through radiation.   Follow up PT: It is recommended you return every 3 months for the first 3 years following surgery to be assessed on the SOZO machine for an L-Dex score. This helps prevent clinically significant lymphedema in 95% of patients. These follow up screens are 15 minute appointments that you are not billed for. You are scheduled for May 2nd at 11:45.

## 2020-06-13 NOTE — Therapy (Signed)
[place], [place], [address] Phone: [phone]   Fax:  [phone]  Physical Therapy Treatment  Patient Details  Name: Michelle Anthony MRN: [id number] Date of Birth: 1965-05-10 Referring Provider (PT): Dr. Erroll Luna   Encounter Date: 06/13/2020   PT End of Session - 06/13/20 1157    Visit Number 2    Number of Visits 2    PT Start Time 1105    PT Stop Time 1157    PT Time Calculation (min) 52 min    Activity Tolerance Patient tolerated treatment well    Behavior During Therapy [facility] for tasks assessed/performed           Past Medical History:  Diagnosis Date  . Erythema nodosum   . Family history of breast cancer   . Family history of melanoma   . Frequent UTI   . Frozen shoulder   . STD (sexually transmitted disease)    H/O HSV II    Past Surgical History:  Procedure Laterality Date  . BOTOX INJECTION    . BREAST LUMPECTOMY WITH RADIOACTIVE SEED AND SENTINEL LYMPH NODE BIOPSY Right 05/17/2020   Procedure: RIGHT BREAST LUMPECTOMY WITH RADIOACTIVE SEED x5 AND SENTINEL LYMPH NODE MAPPING;  Surgeon: Erroll Luna, MD;  Location: [place];  Service: General;  Laterality: Right;  . CLOSED MANIPULATION SHOULDER    . PORTACATH PLACEMENT Right 01/05/2020   Procedure: INSERTION PORT-A-CATH WITH ULTRASOUND GUIDANCE;  Surgeon: Erroll Luna, MD;  Location: [place];  Service: General;  Laterality: Right;    There were no vitals filed for this visit.   Subjective Assessment - 06/13/20 1106    Subjective Patient reports she underwent neoadjuvant chemotherapy from 01/05/2021 - 04/22/2020. She had a right lumpectomy and sentinel node biopsy (2 negative nodes) on 05/17/2020. She still has her port in place and is getting Kadcyla every 3 weeks until 12/29/2020. She will also undergo radiation with simulation 06/15/2020.    Patient Stated Goals See how I'm doing with my  arm    Currently in Pain? Yes    Pain Score 5     Pain Location Breast    Pain Orientation Right    Pain Descriptors / Indicators Throbbing;Aching    Pain Type Surgical pain    Pain Onset 1 to 4 weeks ago    Pain Frequency Intermittent    Aggravating Factors  Touching the breast or having fabric against breast    Pain Relieving Factors Not having bra against it    Multiple Pain Sites No              [facility] PT Assessment - 06/13/20 0001      Assessment   Medical Diagnosis s/p right lumpectomy and SLNB    Referring Provider (PT) Dr. [name] [facility]    Onset Date/Surgical Date 05/17/20    Hand Dominance Right    Prior Therapy Baselines      Precautions   Precautions Other (comment)    Precaution Comments recent surgery and right arm lymphedema risk      Restrictions   Weight Bearing Restrictions No      Balance Screen   Has the patient fallen in the past 6 months No    Has the patient had a decrease in activity level because of a fear of falling?  No    Is the patient reluctant to leave their home because of a fear of falling?  No  Home Environment   Living Environment Private residence    Living Arrangements Children   57 y.o. son   Available Help at Discharge Family      Prior Function   Level of Independence Independent    Vocation Full time employment    Vocation Requirements She has continued to work    Leisure She is walking 30 minutes 3x/week and sometimes 20 min a few days      Cognition   Overall Cognitive Status Within Functional Limits for tasks assessed      Observation/Other Assessments   Observations Right axillary and breast incisions both appear to be healing well. There is mild edema present right lateral breast. No signs of infection noted.      Posture/Postural Control   Posture/Postural Control No significant limitations      ROM / Strength   AROM / PROM / Strength AROM      AROM   AROM Assessment Site Shoulder    Right/Left Shoulder  Right    Right Shoulder Extension 58 Degrees    Right Shoulder Flexion 148 Degrees    Right Shoulder ABduction 150 Degrees    Right Shoulder Internal Rotation 70 Degrees    Right Shoulder External Rotation 88 Degrees      Strength   Overall Strength Within functional limits for tasks performed             LYMPHEDEMA/ONCOLOGY QUESTIONNAIRE - 06/13/20 0001      Type   Cancer Type Right breast cancer      Surgeries   Lumpectomy Date 05/17/20    Sentinel Lymph Node Biopsy Date 05/17/20    Number Lymph Nodes Removed 2      Treatment   Active Chemotherapy Treatment No    Past Chemotherapy Treatment Yes    Date 04/22/20    Active Radiation Treatment No    Past Radiation Treatment No    Current Hormone Treatment No    Past Hormone Therapy No      What other symptoms do you have   Are you Having Heaviness or Tightness Yes    Are you having Pain Yes    Are you having pitting edema No    Is it Hard or Difficult finding clothes that fit No    Do you have infections No    Is there Decreased scar mobility Yes    Stemmer Sign No      Lymphedema Assessments   Lymphedema Assessments Upper extremities      Right Upper Extremity Lymphedema   10 cm Proximal to Olecranon Process 27.3 cm    Olecranon Process 24.5 cm    10 cm Proximal to Ulnar Styloid Process 20.8 cm    Just Proximal to Ulnar Styloid Process 15.9 cm    Across Hand at [employer] 18.7 cm    At [place] of 2nd Digit 6 cm      Left Upper Extremity Lymphedema   10 cm Proximal to Olecranon Process 27 cm    Olecranon Process 24.1 cm    10 cm Proximal to Ulnar Styloid Process 20.8 cm    Just Proximal to Ulnar Styloid Process 15.3 cm    Across Hand at [employer] 18.4 cm    At [place] of 2nd Digit 6.2 cm              Quick Dash - 06/13/20 0001    Open a tight or new jar Mild difficulty    Do  heavy household chores (wash walls, wash floors) No difficulty    Carry a shopping bag or briefcase No difficulty     Wash your back No difficulty    Use a knife to cut food No difficulty    Recreational activities in which you take some force or impact through your arm, shoulder, or hand (golf, hammering, tennis) Mild difficulty    During the past week, to what extent has your arm, shoulder or hand problem interfered with your normal social activities with family, friends, neighbors, or groups? Not at all    During the past week, to what extent has your arm, shoulder or hand problem limited your work or other regular daily activities Not at all    Arm, shoulder, or hand pain. Moderate    Tingling (pins and needles) in your arm, shoulder, or hand Moderate    Difficulty Sleeping Mild difficulty    DASH Score 15.91 %                          PT Education - 06/13/20 1156    Education Details Aftercare; scar massage; HEP    Person(s) Educated Patient    Methods Explanation;Demonstration;Handout    Comprehension Returned demonstration;Verbalized understanding               PT Long Term Goals - 06/13/20 1200      PT LONG TERM GOAL #1   Title Patient will demonstrate she has regained full shoulder ROM and function post operatively compared to baselines.    Time 6    Period Months    Status Achieved                 Plan - 06/13/20 1157    Clinical Impression Statement Patient is doing well s/p right lumpectomy and sentinel node biopsy. She had 2 negative nodes removed. She underwent neoadjuvant chemotherapy prior to surgery and reports she is regaining her strength and endurance. Her shoulder ROM and function is back to baseline, her incisions are healing well, and she has no sign of lymphedema. There is edema present mostly in her right lateral breast but it appears to be normal post surgical swelling. She will benefit from attending the After Breast Cancer class for lymphedema education but otherwise has no PT needs at this time.    PT Treatment/Interventions ADLs/Self Care Home  Management;Therapeutic exercise;Patient/family education    PT Next Visit Plan D/C    PT Home Exercise Plan Post op shoulder ROM HEP    Consulted and Agree with Plan of Care Patient           Patient will benefit from skilled therapeutic intervention in order to improve the following deficits and impairments:  Postural dysfunction,Decreased range of motion,Impaired UE functional use,Pain,Decreased knowledge of precautions  Visit Diagnosis: Malignant neoplasm of upper-outer quadrant of right breast in female, estrogen receptor positive [facility])  Aftercare following surgery for neoplasm     Problem List Patient Active Problem List   Diagnosis Date Noted  . Port-A-Cath in place 01/13/2020  . Genetic testing 01/01/2020  . Family history of breast cancer   . Family history of melanoma   . Malignant neoplasm of upper-outer quadrant of right breast in female, estrogen receptor positive ([place]) 12/16/2019  . Arthritis of carpometacarpal (CMC) joint of thumb 11/01/2017  . Fatigue 11/01/2017   PHYSICAL THERAPY DISCHARGE SUMMARY  Visits from Start of Care: 2  Current functional level related to goals /  functional outcomes: Goals met. See above for objective measurements.   Remaining deficits: Edema right breast   Education / Equipment: HEP; lymphedema education Plan: Patient agrees to discharge.  Patient goals were met. Patient is being discharged due to meeting the stated rehab goals.  ?????         Annia Friendly, [place] 06/13/20 12:01 PM  [place], [place], [address] Phone: [phone]   Fax:  [phone]  Name: ALDEAN SUDDETH MRN: [id number] Date of Birth: [birth year]

## 2020-06-15 ENCOUNTER — Ambulatory Visit
Admission: RE | Admit: 2020-06-15 | Discharge: 2020-06-15 | Disposition: A | Discharge status: Home / Self Care | Payer: BC Managed Care – PPO | Source: Ambulatory Visit | Attending: Radiation Oncology | Admitting: Radiation Oncology

## 2020-06-15 ENCOUNTER — Encounter: Payer: Self-pay | Admitting: Radiation Oncology

## 2020-06-15 ENCOUNTER — Encounter: Payer: BC Managed Care – PPO | Admitting: *Deleted

## 2020-06-15 ENCOUNTER — Other Ambulatory Visit: Payer: Self-pay

## 2020-06-15 ENCOUNTER — Other Ambulatory Visit: Payer: BC Managed Care – PPO

## 2020-06-15 VITALS — BP 107/61 | HR 65 | Resp 20 | Wt 141.5 lb

## 2020-06-15 DIAGNOSIS — Z170 Estrogen receptor positive status [ER+]: Secondary | ICD-10-CM

## 2020-06-15 DIAGNOSIS — D6481 Anemia due to antineoplastic chemotherapy: Secondary | ICD-10-CM | POA: Insufficient documentation

## 2020-06-15 DIAGNOSIS — C50411 Malignant neoplasm of upper-outer quadrant of right female breast: Secondary | ICD-10-CM | POA: Insufficient documentation

## 2020-06-15 DIAGNOSIS — R200 Anesthesia of skin: Secondary | ICD-10-CM | POA: Diagnosis not present

## 2020-06-15 DIAGNOSIS — T451X5A Adverse effect of antineoplastic and immunosuppressive drugs, initial encounter: Secondary | ICD-10-CM | POA: Insufficient documentation

## 2020-06-15 DIAGNOSIS — Z510 Encounter for antineoplastic radiation therapy: Secondary | ICD-10-CM | POA: Insufficient documentation

## 2020-06-15 DIAGNOSIS — R197 Diarrhea, unspecified: Secondary | ICD-10-CM | POA: Diagnosis not present

## 2020-06-15 DIAGNOSIS — R112 Nausea with vomiting, unspecified: Secondary | ICD-10-CM | POA: Insufficient documentation

## 2020-06-15 NOTE — Progress Notes (Signed)
Radiation Oncology         [phone] ________________________________  Name: Michelle Anthony MRN: [id number]  Date: 06/15/2020  DOB: Jul 11, 1965  Re-Evaluation Note  CC: Katherina Mires, MD  Nicholas Lose, MD    ICD-10-CM   1. 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 She was seen in the multidisciplinary breast clinic on 12/23/2019, at which time it was recommended that she proceed with genetic testing, MRI, echocardiogram, chemotherapy class, neoadjuvant chemotherapy/port, adjuvant radiation therapy, and aromatase inhibitor.  Since consultation, she underwent genetic testing on 12/23/2019, which was negative.  MRI of bilateral breasts on 12/30/2019 showed the known grade 2-3 invasive lobular carcinoma with LCIS, seen as an enhancing mass that measured 3.1 cm in greatest dimension. There were also adjacent satellite masses as well as an area of clumped non mass-like enhancement that extended anteriorly from the mass. The mass and the non-mass enhancement spanned a total of 4.2 cm anterior-posterior. There was also noted to be a 6 mm mass 2 cm lateral to the primary mass and two small, 3 and 4 mm, masses that lied anteriorly and inferiorly to the primary mass. Additionally, there were two adjacent masses along the anterior, medial aspect of the mass, one of which was associated with biopsy clip artifact and may have reflected the biopsy that revealed atypical lobular hyperplasia. There was no evidence of left breast malignancy or metastatic lymphadenopathy.   An MRI-guided core needle biopsy of the right breast on 01/17/2021 revealed grade 2 invasive and in situ lobular carcinoma of the upper outer right  breast and lobular carcinoma in situ of the retroareolar anterior right breast.  She was treated with six cycles of Sulphur from 01/06/2020 to 04/22/2020 under the care of Dr. Lindi Adie. She tolerated treatment relatively well with the exception of mild to moderate nausea and vomiting, bone pain, chemotherapy-induced anemia, alopecia, and diarrhea. She began Kadcyla maintenance on 06/02/2020.  MRI of bilateral breasts on 04/24/2020 showed marked improvement in the extent of the abnormal enhancement in the right breast in the setting of neoadjuvant chemotherapy. The residual enhancement of the dominant mass measured 0.6 cm, as compared to 3.1 cm on the prior MRI. There was no evidence of left breast malignancy.  She opted to proceed with a right breast lumpectomy and right axillary sentinel lymph node biopsy on 05/17/2020 under the care of Dr. Brantley Stage. Pathology from the procedure revealed scattered foci of residual invasive lobular carcinoma, grade 2, spanning fibrotic areas of approximately 0.9 cm (ribbon clip), 1.0 cm (barbell clip), 0.6 cm (central medial coil clip), and 0.6 cm (clinder clip). Resection margins were negative for carcinoma with the closest being the junction of the medial and inferior margins at 0.1 cm and the posterior margin at 0.3 cm. There was also noted to be lobular carcinoma in-situ. Two right axillary sentinel lymph nodes were biopsied, both of which were negative for carcinoma.  On review of systems, the patient reports some soreness in the breast but no significant pain at this time. She denies swelling in her right arm or hand but does noticed some numbness in the upper part of her arm.     Allergies:  has No Known  Allergies.  Meds: Current Outpatient Medications  Medication Sig Dispense Refill  . COLLAGEN PO Apply topically daily.    [name] ibuprofen (ADVIL) 200 MG tablet Take 200 mg by mouth every 6 (six) hours as needed.    . Multiple Vitamin (MULTIVITAMIN) tablet Take  1 tablet by mouth daily.    . Tretinoin (RETIN-A EX) Apply topically.    [name] LORazepam (ATIVAN) 0.5 MG tablet Take by mouth. (Patient not taking: Reported on 06/15/2020)     No current facility-administered medications for this encounter.    Physical Findings: The patient is in no acute distress. Patient is alert and oriented.  weight is 141 lb 8 oz (64.2 kg). Her blood pressure is 107/61 and her pulse is 65. Her respiration is 20 and oxygen saturation is 100%.  No significant changes. Lungs are clear to auscultation bilaterally. Heart has regular rate and rhythm. No palpable cervical, supraclavicular, or axillary adenopathy. Abdomen soft, non-tender, normal bowel sounds. Right breast: Well-healing scar scar in the lateral aspect of the breast.  Some residual bruising noted.  Some mild swelling noted in the breast but no obvious significant seroma.  Drain site noted in the lateral aspect of the breast without signs of drainage or infection.  Lab Findings: Lab Results  Component Value Date   WBC 5.3 06/02/2020   HGB 11.4 (L) 06/02/2020   HCT 34.4 (L) 06/02/2020   MCV 97.5 06/02/2020   PLT 218 06/02/2020    Radiographic Findings: NM Sentinel Node Inj-No Rpt (Breast)  Result Date: 05/17/2020 Sulfur colloid was injected by the nuclear medicine technologist for melanoma sentinel node.   MM Breast Surgical Specimen  Result Date: 05/17/2020 CLINICAL DATA:  Status post breast conservation surgery today after earlier multifocal radioactive seed localizations (please see radioactive seed localization report dated 05/16/2020 for details). EXAM: SPECIMEN RADIOGRAPH OF THE RIGHT BREAST COMPARISON:  Previous exam(s). FINDINGS: Status post excision of the right breast. The 5 radioactive seeds and 5 biopsy marker clip are present and completely intact. Findings discussed with the OR staff during the procedure. IMPRESSION: Specimen radiograph of the right breast. Electronically Signed   By: Franki Cabot M.D.    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  She would be a good candidate for breast conserving therapy with radiation therapy as a component.  I discussed the overall treatment course side effects and potential toxicities of radiation therapy in this situation with the patient and her daughter.  The patient appears to understand and wishes to proceed with planned course of treatment.  Plan:  Patient is scheduled for CT simulation later today.  Anticipate radiation therapy starting in approximately a week.  The patient will be a good candidate for hypofractionated accelerated radiation therapy over approximately 4 weeks.  Given the close surgical margins she will receive an additional dose with her boost field directed at the lumpectomy cavities.  Total time spent in this encounter was 35 minutes which included reviewing the patient's most recent genetic testing, breast MRIs, biopsy, lumpectomy, pathology reports, follow-ups, neoadjuvant chemotherapy, physical examination, and documentation.  -----------------------------------  Blair Promise, PhD, MD  This document serves as a record of services personally performed by Gery Pray, MD. It was created on his behalf by Clerance Lav, a trained medical scribe. The creation of this record is based on the  scribe's personal observations and the provider's statements to them. This document has been checked and approved by the attending provider.

## 2020-06-19 DIAGNOSIS — C50411 Malignant neoplasm of upper-outer quadrant of right female breast: Secondary | ICD-10-CM | POA: Diagnosis not present

## 2020-06-21 NOTE — Progress Notes (Signed)
Patient Care Team: Katherina Mires, MD as PCP - General (Family Medicine) Rockwell Germany, RN as Oncology Nurse Navigator Mauro Kaufmann, RN as Oncology Nurse Navigator Erroll Luna, MD as Consulting Physician (General Surgery) Nicholas Lose, MD as Consulting Physician (Hematology and Oncology) Gery Pray, MD as Consulting Physician (Radiation Oncology)  DIAGNOSIS:    ICD-10-CM   1. Malignant neoplasm of upper-outer quadrant of right breast in female, estrogen receptor positive ([place])  C50.411    Z17.0     SUMMARY OF ONCOLOGIC HISTORY: Oncology History  Malignant neoplasm of upper-outer quadrant of right breast in female, estrogen receptor positive ([place])  12/16/2019 Initial Diagnosis   Screening mammogram  showed a right breast asymmetry. Diagnostic mammogram showed a 2.9cm mass at the 9 o'clock position with 2 adjacent masses at the 10 and 8 o'clock positions, 0.9cm and 1.4cm respectively, and an enlarged lymph node, 1.3cm.  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 The report date is 12/31/2019.   The Multi-Cancer Panel offered by [facility] includes sequencing and/or deletion duplication testing of the following 85 genes: AIP, ALK, APC, ATM, AXIN2,BAP1,  BARD1, BLM, BMPR1A, BRCA1, BRCA2, BRIP1, CASR, CDC73, CDH1, CDK4, CDKN1B, CDKN1C, CDKN2A (p14ARF), CDKN2A (p16INK4a), CEBPA, CHEK2, CTNNA1, DICER1, DIS3L2, EGFR (c.2369C>T, p.Thr790Met variant only), EPCAM (Deletion/duplication testing only), FH, FLCN, GATA2, GPC3, GREM1 (Promoter region deletion/duplication testing only), HOXB13 (c.251G>A, p.Gly84Glu), HRAS, KIT, MAX, MEN1, MET, MITF (c.952G>A, p.Glu318Lys variant only), MLH1, MSH2, MSH3, MSH6, MUTYH, NBN, NF1,  NF2, NTHL1, PALB2, PDGFRA, PHOX2B, PMS2, POLD1, POLE, POT1, PRKAR1A, PTCH1, PTEN, RAD50, RAD51C, RAD51D, RB1, RECQL4, RET, RNF43, RUNX1, SDHAF2, SDHA (sequence changes only), SDHB, SDHC, SDHD, SMAD4, SMARCA4, SMARCB1, SMARCE1, STK11, SUFU, TERC, TERT, TMEM127, TP53, TSC1, TSC2, VHL, WRN and WT1.   01/06/2020 - 04/22/2020 Chemotherapy   Neoadjuvant chemo with TCH Perjeta x6 cycles       05/17/2020 Surgery   Right lumpectomy ([facility]): scattered foci of residual invasive lobular carcinoma, 0.6-1.0cm, grade 2, clear margins, 2 right axillary lymph nodes negative for carcinoma.   06/02/2020 -  Chemotherapy    Patient is on Treatment Plan: BREAST ADO-TRASTUZUMAB EMTANSINE (KADCYLA) Q21D        CHIEF COMPLIANT: Kadcyla maintenance  INTERVAL HISTORY: Michelle Anthony is a 55 y.o. with above-mentioned history of right breast cancer treated with neoadjuvant chemotherapy, lumpectomy,and is currently on Kadcyla maintenance. She presents to the clinictodayfor treatment.   She complains of peripheral neuropathy in the tips of her toes she grades the pain and discomfort as 3 out of 10.  This is been relatively stable.  Denies any nausea or vomiting or any other side effects from Kadcyla.  She is going to start radiation soon.  ALLERGIES:  has No Known Allergies.  MEDICATIONS:  Current Outpatient Medications  Medication Sig Dispense Refill  . COLLAGEN PO Apply topically daily.    [name] ibuprofen (ADVIL) 200 MG tablet Take 200 mg by mouth every 6 (six) hours as needed.    [name] LORazepam (ATIVAN) 0.5 MG tablet Take by mouth. (Patient not taking: Reported on 06/15/2020)    . Multiple Vitamin (MULTIVITAMIN) tablet Take 1 tablet by mouth daily.    . Tretinoin (RETIN-A EX) Apply topically.     No current  facility-administered medications for this visit.    PHYSICAL EXAMINATION: ECOG PERFORMANCE STATUS: 1 - Symptomatic but completely ambulatory  There were no vitals filed for this visit. There were no vitals  filed for this visit.  LABORATORY DATA:  I have reviewed the data as listed CMP Latest Ref Rng & Units 06/02/2020 05/11/2020 04/20/2020  Glucose 70 - 99 mg/dL 94 83 102(H)  BUN 6 - 20 mg/dL 17 15 14   Creatinine 0.44 - 1.00 mg/dL 0.72 0.74 0.67  Sodium 135 - 145 mmol/L 145 141 141  Potassium 3.5 - 5.1 mmol/L 3.8 3.6 3.9  Chloride 98 - 111 mmol/L 108 106 107  CO2 22 - 32 mmol/L 26 28 26   Calcium 8.9 - 10.3 mg/dL 9.0 9.0 9.5  Total Protein 6.5 - 8.1 g/dL 6.5 5.9(L) 7.0  Total Bilirubin 0.3 - 1.2 mg/dL 0.4 0.4 0.4  Alkaline Phos 38 - 126 U/L 66 55 68  AST 15 - 41 U/L 13(L) 19 19  ALT 0 - 44 U/L 16 25 35    Lab Results  Component Value Date   WBC 5.3 06/02/2020   HGB 11.4 (L) 06/02/2020   HCT 34.4 (L) 06/02/2020   MCV 97.5 06/02/2020   PLT 218 06/02/2020   NEUTROABS 3.3 06/02/2020    ASSESSMENT & PLAN:  Malignant neoplasm of upper-outer quadrant of right breast in female, estrogen receptor positive ([place]) 12/09/2019:Screening mammogram showed a right breast asymmetry. Diagnostic mammogram showed a 2.9cm mass at the 9 o'clock position with 2 adjacent masses at the 10 and 8 o'clock positions, 0.9cm and 1.4cm respectively, and an enlarged lymph node, 1.3cm. 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 Neoadjuvant chemotherapy with TCH Perjeta 6 cycles followed by Herceptinand Perjeta versus Kadcylamaintenance for 1 year(also could be randomized compass HER 2 trial) 2. Followed by breast conserving surgery if possible with sentinel lymph node study 3. Followed by adjuvant radiation therapy if patient had lumpectomy 4.Followed by adjuvant antiestrogen therapy SWOG S 1714 neuropathy clinical trial: No adverse effects from participating in the trial 12/31/2019: Breast MRI: Known malignancy 3.1 cm, with non-mass enhancement total measuring 4.2 cm.  Additional masses 6 mm and 3 and 4 mm MRI guided biopsy on the 2 additional masses:Invasive lobular cancer with LCIS, retroareolar: LCIS grade 2 --------------------------------------------------------------------------------------------------------------------------------------------- 05/17/20:Right lumpectomy ([facility]): scattered foci of residual invasive lobular carcinoma, 0.6-1.0cm, grade 2, clear margins, 2 right axillary lymph nodes negative for carcinoma.  Current treatment: Kadcyla Maintenance, today cycle 2 Today's labs are not available yet because of technical issues with the lab machine.  Peripheral neuropathy: Numbness of the tips of her toes: Grade 1-2, will add gabapentin 100 mg at bedtime. Return to clinic in 3 weeks for cycle 3    No orders of the defined types were placed in this encounter.  The patient has a good understanding of the overall plan. she agrees with it. she will call with any problems that may develop before the next visit here.  Total time spent: 30 mins including face to face time and time spent for planning, charting and coordination of care  Rulon Eisenmenger, MD, MPH 06/22/2020  I, Cloyde Reams Dorshimer, am acting as scribe for Dr. Nicholas Lose.  I have reviewed the above documentation for accuracy and completeness, and I agree with the above.

## 2020-06-22 ENCOUNTER — Inpatient Hospital Stay: Payer: BC Managed Care – PPO

## 2020-06-22 ENCOUNTER — Encounter: Payer: Self-pay | Admitting: *Deleted

## 2020-06-22 ENCOUNTER — Inpatient Hospital Stay: Payer: BC Managed Care – PPO | Attending: Hematology and Oncology

## 2020-06-22 ENCOUNTER — Inpatient Hospital Stay (HOSPITAL_BASED_OUTPATIENT_CLINIC_OR_DEPARTMENT_OTHER): Payer: BC Managed Care – PPO | Admitting: Hematology and Oncology

## 2020-06-22 ENCOUNTER — Other Ambulatory Visit: Payer: Self-pay

## 2020-06-22 ENCOUNTER — Ambulatory Visit: Payer: BC Managed Care – PPO | Admitting: Radiation Oncology

## 2020-06-22 VITALS — BP 102/55 | HR 74 | Temp 98.0°F | Resp 18

## 2020-06-22 DIAGNOSIS — C50411 Malignant neoplasm of upper-outer quadrant of right female breast: Secondary | ICD-10-CM

## 2020-06-22 DIAGNOSIS — Z923 Personal history of irradiation: Secondary | ICD-10-CM | POA: Diagnosis not present

## 2020-06-22 DIAGNOSIS — Z170 Estrogen receptor positive status [ER+]: Secondary | ICD-10-CM

## 2020-06-22 DIAGNOSIS — Z9221 Personal history of antineoplastic chemotherapy: Secondary | ICD-10-CM | POA: Insufficient documentation

## 2020-06-22 DIAGNOSIS — Z5112 Encounter for antineoplastic immunotherapy: Secondary | ICD-10-CM | POA: Insufficient documentation

## 2020-06-22 DIAGNOSIS — G629 Polyneuropathy, unspecified: Secondary | ICD-10-CM | POA: Insufficient documentation

## 2020-06-22 DIAGNOSIS — Z79899 Other long term (current) drug therapy: Secondary | ICD-10-CM | POA: Insufficient documentation

## 2020-06-22 LAB — COMPREHENSIVE METABOLIC PANEL
ALT: 46 U/L — ABNORMAL HIGH (ref 0–44)
AST: 25 U/L (ref 15–41)
Albumin: 4.1 g/dL (ref 3.5–5.0)
Alkaline Phosphatase: 69 U/L (ref 38–126)
Anion gap: 8 (ref 5–15)
BUN: 15 mg/dL (ref 6–20)
CO2: 28 mmol/L (ref 22–32)
Calcium: 9.5 mg/dL (ref 8.9–10.3)
Chloride: 105 mmol/L (ref 98–111)
Creatinine, Ser: 0.7 mg/dL (ref 0.44–1.00)
GFR, Estimated: >60 mL/min (ref >60)
Glucose, Bld: 106 mg/dL — ABNORMAL HIGH (ref 70–99)
Potassium: 3.8 mmol/L (ref 3.5–5.1)
Sodium: 141 mmol/L (ref 135–145)
Total Bilirubin: 0.3 mg/dL (ref 0.3–1.2)
Total Protein: 7.2 g/dL (ref 6.5–8.1)

## 2020-06-22 LAB — CBC WITH DIFFERENTIAL/PLATELET
Abs Immature Granulocytes: 0.01 10*3/uL (ref 0.00–0.07)
Basophils Absolute: 0.1 10*3/uL (ref 0.0–0.1)
Basophils Relative: 1 %
Eosinophils Absolute: 0.1 10*3/uL (ref 0.0–0.5)
Eosinophils Relative: 2 %
HCT: 38 % (ref 36.0–46.0)
Hemoglobin: 12.5 g/dL (ref 12.0–15.0)
Immature Granulocytes: 0 %
Lymphocytes Relative: 37 %
Lymphs Abs: 2 10*3/uL (ref 0.7–4.0)
MCH: 31.8 pg (ref 26.0–34.0)
MCHC: 32.9 g/dL (ref 30.0–36.0)
MCV: 96.7 fL (ref 80.0–100.0)
Monocytes Absolute: 0.4 10*3/uL (ref 0.1–1.0)
Monocytes Relative: 7 %
Neutro Abs: 2.8 10*3/uL (ref 1.7–7.7)
Neutrophils Relative %: 52 %
Platelets: 152 10*3/uL (ref 150–400)
RBC: 3.93 MIL/uL (ref 3.87–5.11)
RDW: 12.2 % (ref 11.5–15.5)
WBC: 5.3 10*3/uL (ref 4.0–10.5)
nRBC: 0 /100 WBC

## 2020-06-22 LAB — RESEARCH LABS

## 2020-06-22 MED ORDER — DIPHENHYDRAMINE HCL 25 MG PO CAPS
ORAL_CAPSULE | ORAL | Status: AC
  Filled 2020-06-22: qty 2

## 2020-06-22 MED ORDER — GABAPENTIN 100 MG PO CAPS: 100.0000 mg | ORAL_CAPSULE | Freq: Every day | ORAL | 3 refills | Status: DC

## 2020-06-22 MED ORDER — SODIUM CHLORIDE 0.9 % IV SOLN
3.6000 mg/kg | Freq: Once | INTRAVENOUS | Status: AC
  Administered 2020-06-22: 240 mg via INTRAVENOUS
  Filled 2020-06-22: qty 8

## 2020-06-22 MED ORDER — SODIUM CHLORIDE 0.9 % IV SOLN
Freq: Once | INTRAVENOUS | Status: AC
  Filled 2020-06-22: qty 250

## 2020-06-22 MED ORDER — HEPARIN SOD (PORK) LOCK FLUSH 100 UNIT/ML IV SOLN
500.0000 [IU] | Freq: Once | INTRAVENOUS | Status: AC | PRN
  Administered 2020-06-22: 500 [IU]
  Filled 2020-06-22: qty 5

## 2020-06-22 MED ORDER — ACETAMINOPHEN 325 MG PO TABS
650.0000 mg | ORAL_TABLET | Freq: Once | ORAL | Status: AC
  Administered 2020-06-22: 650 mg via ORAL

## 2020-06-22 MED ORDER — ACETAMINOPHEN 325 MG PO TABS
ORAL_TABLET | ORAL | Status: AC
  Filled 2020-06-22: qty 2

## 2020-06-22 MED ORDER — SODIUM CHLORIDE 0.9% FLUSH
10.0000 mL | INTRAVENOUS | Status: DC | PRN
  Administered 2020-06-22: 10 mL
  Filled 2020-06-22: qty 10

## 2020-06-22 MED ORDER — DIPHENHYDRAMINE HCL 25 MG PO CAPS
50.0000 mg | ORAL_CAPSULE | Freq: Once | ORAL | Status: AC
  Administered 2020-06-22: 50 mg via ORAL

## 2020-06-22 NOTE — Progress Notes (Signed)
Ok to proceed with treatment with labs from 2/17 per MD.

## 2020-06-22 NOTE — Research (Signed)
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 Michelle Anthony The patient presented to the clinic alone today for her routine MD follow up appt and maintenance Kadcyla (cycle 2 today). The pt stated she will begin RT this week.  Patient's concomitant medications were reviewed with the pt.The pt reported losing one nail since her last visit.  The pt was encouraged to discuss her ongoing nail issues with Dr. Lindi Adie. She reports that she developed neuropathy symptoms after her 5th chemo cycle.   PROs:Questionnaireswereprovidedtothe patientafter arrival to the [facility].Collected questionnaires and checked for completeness and accuracy.  Labs:Optionalresearch labswerecollected,per protocol,alongwith other standard of care labs ordered by providerthis morning. Treatment Update: The pt completed her Docetaxel/Carboplatin chemo on 04/20/20.  The pt received a total of 6 cycles.  She did receive a Docetaxel dose reduction for her 6th and final chemo.  Her Docetaxel was reduced from 75 mg/m2 to 65 mg/m2 on 04/20/20 due to neuropathy symptoms.  The pt's cumulative dose at the time of her treatment modification was (130 mg x 5 cycles= 650 mg).  The pt did receive one cycle at the reduced dose (110 mg).  Total cumulative dose of Docetaxel is 760 mg.   The pt did receive 1 cycle of trastuzumab and pertuzumab on 05/11/20 before her surgery on 05/17/20.  The research nurse contacted Gwendolyn Fill, SWOG data coordinator, on reporting her treatment data for the study.   Physician Assessments:CTCAE and Treatment Burden forms completed and signed by Dr. Lindi Adie.    Assessment for Interventions for CIPN:Reviewed with patient andCRFs completed.  She reports using Udder cream for dry skin to her hands and feet (just "1-3 times per week" per pt). She denies any other topical agents. Pt denies taking fish oil and  glutamine.The pt does report taking amultivitamin. The pt provided the amounts of Vitamin E (15 mg), B6 (1.7 mg), and B12 (4.8 mcg) in her multivitamin which she takes 2 pills/gummies daily. She reported taking narcotics (hydrocodone-acetaminophen 5/325mg ) following her breast surgery for surgical pain, not for neuropathy symptoms.  She said that she took 1 pill per day for about 2 weeks.  She said that she also took ibuprofen 800 mg after surgery for about 3 weeks. The pt specifically denied the following medications:  anti-depressants, gabapentin, tramadol, and duloxetine.  The pt denies using any complementary or alternative treatments since her last visit. Timed Get Up and Go Test: The pt completed this test in 8.1 seconds.  History of Falls:  She denies any history of falls in the past 6 months, but she did report a slip in the shower once.  Neuropen Assessment:Completed per protocol bythiscertified research RN,and recorded by Clabe Seal, Research Coordinator. [place] Assessment:Completed per protocol bythiscertified research RN,and recorded by Clabe Seal, Research officer, political party. Plan:Informed patient ofweek 52assessments scheduled to be done around 12/29/20.Patient denied having any questions at this time.Patientwasthanked for her time and contribution to study and wasencouraged to call clinicforany questions or concernsshe may haveprior tohernextappointment. Brion Aliment RN, BSN, CCRP  Clinical Research Nurse Lead 06/22/2020 11:40 AM

## 2020-06-22 NOTE — Assessment & Plan Note (Signed)
12/09/2019:Screening mammogram showed a right breast asymmetry. Diagnostic mammogram showed a 2.9cm mass at the 9 o'clock position with 2 adjacent masses at the 10 and 8 o'clock positions, 0.9cm and 1.4cm respectively, and an enlarged lymph node, 1.3cm. 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 Neoadjuvant chemotherapy with TCH Perjeta 6 cycles followed by Herceptinand Perjeta versus Kadcylamaintenance for 1 year(also could be randomized compass HER 2 trial) 2. Followed by breast conserving surgery if possible with sentinel lymph node study 3. Followed by adjuvant radiation therapy if patient had lumpectomy 4.Followed by adjuvant antiestrogen therapy SWOG S 1714 neuropathy clinical trial: No adverse effects from participating in the trial 12/31/2019: Breast MRI: Known malignancy 3.1 cm, with non-mass enhancement total measuring 4.2 cm. Additional masses 6 mm and 3 and 4 mm MRI guided biopsy on the 2 additional masses:Invasive lobular cancer with LCIS, retroareolar: LCIS grade 2 --------------------------------------------------------------------------------------------------------------------------------------------- 05/17/20:Right lumpectomy ([facility]): scattered foci of residual invasive lobular carcinoma, 0.6-1.0cm, grade 2, clear margins, 2 right axillary lymph nodes negative for carcinoma.  Current treatment: Kadcyla Maintenance, today cycle 2 Labs reviewed  Peripheral neuropathy: Numbness of the tips of her toes and tips of the fingers. We will need to monitor this closely. Return to clinic in 3 weeks for cycle 3

## 2020-06-22 NOTE — Progress Notes (Signed)
Per Dr. Lindi Adie OK to treat with today's labs

## 2020-06-22 NOTE — Patient Instructions (Signed)
Ado-Trastuzumab Emtansine for injection What is this medicine? ADO-TRASTUZUMAB EMTANSINE (ADD oh traz TOO zuh mab em TAN zine) is a monoclonal antibody combined with chemotherapy. It is used to treat breast cancer. This medicine may be used for other purposes; ask your health care provider or pharmacist if you have questions. COMMON BRAND NAME(S): Kadcyla What should I tell my health care provider before I take this medicine? They need to know if you have any of these conditions:  heart disease  heart failure  infection (especially a virus infection such as chickenpox, cold sores, or herpes)  liver disease  lung or breathing disease, like asthma  tingling of the fingers or toes, or other nerve disorder  an unusual or allergic reaction to ado-trastuzumab emtansine, other medications, foods, dyes, or preservatives  pregnant or trying to get pregnant  breast-feeding How should I use this medicine? This medicine is for infusion into a vein. It is given by a health care professional in a hospital or clinic setting. Talk to your pediatrician regarding the use of this medicine in children. Special care may be needed. Overdosage: If you think you have taken too much of this medicine contact a poison control center or emergency room at once. NOTE: This medicine is only for you. Do not share this medicine with others. What if I miss a dose? It is important not to miss your dose. Call your doctor or health care professional if you are unable to keep an appointment. What may interact with this medicine? This medicine may also interact with the following medications:  atazanavir  boceprevir  clarithromycin  delavirdine  indinavir  dalfopristin; quinupristin  isoniazid, INH  itraconazole  ketoconazole  nefazodone  nelfinavir  ritonavir  telaprevir  telithromycin  tipranavir  voriconazole This list may not describe all possible interactions. Give your health care  provider a list of all the medicines, herbs, non-prescription drugs, or dietary supplements you use. Also tell them if you smoke, drink alcohol, or use illegal drugs. Some items may interact with your medicine. What should I watch for while using this medicine? Visit your doctor for checks on your progress. This drug may make you feel generally unwell. This is not uncommon, as chemotherapy can affect healthy cells as well as cancer cells. Report any side effects. Continue your course of treatment even though you feel ill unless your doctor tells you to stop. You may need blood work done while you are taking this medicine. Call your doctor or health care professional for advice if you get a fever, chills or sore throat, or other symptoms of a cold or flu. Do not treat yourself. This drug decreases your body's ability to fight infections. Try to avoid being around people who are sick. Be careful brushing and flossing your teeth or using a toothpick because you may get an infection or bleed more easily. If you have any dental work done, tell your dentist you are receiving this medicine. Avoid taking products that contain aspirin, acetaminophen, ibuprofen, naproxen, or ketoprofen unless instructed by your doctor. These medicines may hide a fever. Do not become pregnant while taking this medicine or for 7 months after stopping it, men with female partners should use contraception during treatment and for 4 months after the last dose. Women should inform their doctor if they wish to become pregnant or think they might be pregnant. There is a potential for serious side effects to an unborn child. Do not breast-feed an infant while taking this medicine or   for 7 months after the last dose. Men who have a partner who is pregnant or who is capable of becoming pregnant should use a condom during sexual activity while taking this medicine and for 4 months after stopping it. Men should inform their doctors if they wish to  father a child. This medicine may lower sperm counts. Talk to your health care professional or pharmacist for more information. What side effects may I notice from receiving this medicine? Side effects that you should report to your doctor or health care professional as soon as possible:  allergic reactions like skin rash, itching or hives, swelling of the face, lips, or tongue  breathing problems  chest pain or palpitations  fever or chills, sore throat  general ill feeling or flu-like symptoms  light-colored stools  nausea, vomiting  pain, tingling, numbness in the hands or feet  signs and symptoms of bleeding such as bloody or black, tarry stools; red or dark-brown urine; spitting up blood or brown material that looks like coffee grounds; red spots on the skin; unusual bruising or bleeding from the eye, gums, or nose  swelling of the legs or ankles  yellowing of the eyes or skin Side effects that usually do not require medical attention (report to your doctor or health care professional if they continue or are bothersome):  changes in taste  constipation  dizziness  headache  joint pain  muscle pain  trouble sleeping  unusually weak or tired This list may not describe all possible side effects. Call your doctor for medical advice about side effects. You may report side effects to FDA at 1-800-FDA-1088. Where should I keep my medicine? This drug is given in a hospital or clinic and will not be stored at home. NOTE: This sheet is a summary. It may not cover all possible information. If you have questions about this medicine, talk to your doctor, pharmacist, or health care provider.  2021 Elsevier/Gold Standard (2017-08-30 10:03:15)

## 2020-06-23 ENCOUNTER — Telehealth: Payer: Self-pay | Admitting: Hematology and Oncology

## 2020-06-23 ENCOUNTER — Ambulatory Visit
Admission: RE | Admit: 2020-06-23 | Discharge: 2020-06-23 | Disposition: A | Discharge status: Home / Self Care | Payer: BC Managed Care – PPO | Source: Ambulatory Visit | Attending: Radiation Oncology | Admitting: Radiation Oncology

## 2020-06-23 ENCOUNTER — Other Ambulatory Visit: Payer: Self-pay

## 2020-06-23 DIAGNOSIS — Z170 Estrogen receptor positive status [ER+]: Secondary | ICD-10-CM

## 2020-06-23 DIAGNOSIS — C50411 Malignant neoplasm of upper-outer quadrant of right female breast: Secondary | ICD-10-CM | POA: Diagnosis not present

## 2020-06-23 NOTE — Telephone Encounter (Signed)
Scheduled per 3/9 los. Pt will receive an updated appt calendar per next visit appt notes

## 2020-06-24 ENCOUNTER — Ambulatory Visit
Admission: RE | Admit: 2020-06-24 | Discharge: 2020-06-24 | Disposition: A | Discharge status: Home / Self Care | Payer: BC Managed Care – PPO | Source: Ambulatory Visit | Attending: Radiation Oncology | Admitting: Radiation Oncology

## 2020-06-24 ENCOUNTER — Other Ambulatory Visit: Payer: Self-pay

## 2020-06-24 DIAGNOSIS — C50411 Malignant neoplasm of upper-outer quadrant of right female breast: Secondary | ICD-10-CM | POA: Diagnosis not present

## 2020-06-27 ENCOUNTER — Other Ambulatory Visit: Payer: Self-pay

## 2020-06-27 ENCOUNTER — Ambulatory Visit
Admission: RE | Admit: 2020-06-27 | Discharge: 2020-06-27 | Disposition: A | Discharge status: Home / Self Care | Payer: BC Managed Care – PPO | Source: Ambulatory Visit | Attending: Radiation Oncology | Admitting: Radiation Oncology

## 2020-06-27 DIAGNOSIS — C50411 Malignant neoplasm of upper-outer quadrant of right female breast: Secondary | ICD-10-CM | POA: Diagnosis not present

## 2020-06-28 ENCOUNTER — Other Ambulatory Visit: Payer: Self-pay

## 2020-06-28 ENCOUNTER — Ambulatory Visit
Admission: RE | Admit: 2020-06-28 | Discharge: 2020-06-28 | Disposition: A | Discharge status: Home / Self Care | Payer: BC Managed Care – PPO | Source: Ambulatory Visit | Attending: Radiation Oncology | Admitting: Radiation Oncology

## 2020-06-28 DIAGNOSIS — C50411 Malignant neoplasm of upper-outer quadrant of right female breast: Secondary | ICD-10-CM | POA: Diagnosis not present

## 2020-06-28 DIAGNOSIS — Z170 Estrogen receptor positive status [ER+]: Secondary | ICD-10-CM

## 2020-06-28 MED ORDER — ALRA NON-METALLIC DEODORANT (RAD-ONC)
1.0000 "application " | Freq: Once | TOPICAL | Status: AC
  Administered 2020-06-28: 1 via TOPICAL

## 2020-06-28 MED ORDER — RADIAPLEXRX EX GEL: Freq: Once | CUTANEOUS | Status: AC

## 2020-06-28 NOTE — Progress Notes (Signed)
Pt here for patient teaching.  Pt given Radiation and You booklet, skin care instructions, Alra deodorant and Radiaplex gel.  Reviewed areas of pertinence such as fatigue, hair loss, skin changes, breast tenderness and breast swelling . Pt able to give teach back of to pat skin and use unscented/gentle soap,apply Radiaplex bid, avoid applying anything to skin within 4 hours of treatment, avoid wearing an under wire bra and to use an electric razor if they must shave. Pt verbalizes understanding of information given and will contact nursing with any questions or concerns.     Michelle Tolliver M. Sabella Traore RN, BSN      

## 2020-06-29 ENCOUNTER — Ambulatory Visit
Admission: RE | Admit: 2020-06-29 | Discharge: 2020-06-29 | Disposition: A | Discharge status: Home / Self Care | Payer: BC Managed Care – PPO | Source: Ambulatory Visit | Attending: Radiation Oncology | Admitting: Radiation Oncology

## 2020-06-29 DIAGNOSIS — C50411 Malignant neoplasm of upper-outer quadrant of right female breast: Secondary | ICD-10-CM | POA: Diagnosis not present

## 2020-06-30 ENCOUNTER — Ambulatory Visit
Admission: RE | Admit: 2020-06-30 | Discharge: 2020-06-30 | Disposition: A | Discharge status: Home / Self Care | Payer: BC Managed Care – PPO | Source: Ambulatory Visit | Attending: Radiation Oncology | Admitting: Radiation Oncology

## 2020-06-30 DIAGNOSIS — C50411 Malignant neoplasm of upper-outer quadrant of right female breast: Secondary | ICD-10-CM | POA: Diagnosis not present

## 2020-07-01 ENCOUNTER — Ambulatory Visit
Admission: RE | Admit: 2020-07-01 | Discharge: 2020-07-01 | Disposition: A | Discharge status: Home / Self Care | Payer: BC Managed Care – PPO | Source: Ambulatory Visit | Attending: Radiation Oncology | Admitting: Radiation Oncology

## 2020-07-01 DIAGNOSIS — C50411 Malignant neoplasm of upper-outer quadrant of right female breast: Secondary | ICD-10-CM | POA: Diagnosis not present

## 2020-07-04 ENCOUNTER — Other Ambulatory Visit: Payer: Self-pay

## 2020-07-04 ENCOUNTER — Ambulatory Visit
Admission: RE | Admit: 2020-07-04 | Discharge: 2020-07-04 | Disposition: A | Discharge status: Home / Self Care | Payer: BC Managed Care – PPO | Source: Ambulatory Visit | Attending: Radiation Oncology | Admitting: Radiation Oncology

## 2020-07-04 DIAGNOSIS — C50411 Malignant neoplasm of upper-outer quadrant of right female breast: Secondary | ICD-10-CM | POA: Diagnosis not present

## 2020-07-05 ENCOUNTER — Ambulatory Visit: Payer: BC Managed Care – PPO | Admitting: Radiation Oncology

## 2020-07-05 ENCOUNTER — Ambulatory Visit
Admission: RE | Admit: 2020-07-05 | Payer: BC Managed Care – PPO | Source: Ambulatory Visit | Admitting: Radiation Oncology

## 2020-07-05 ENCOUNTER — Ambulatory Visit
Admission: RE | Admit: 2020-07-05 | Discharge: 2020-07-05 | Disposition: A | Discharge status: Home / Self Care | Payer: BC Managed Care – PPO | Source: Ambulatory Visit | Attending: Radiation Oncology | Admitting: Radiation Oncology

## 2020-07-05 DIAGNOSIS — C50411 Malignant neoplasm of upper-outer quadrant of right female breast: Secondary | ICD-10-CM | POA: Diagnosis not present

## 2020-07-06 ENCOUNTER — Other Ambulatory Visit: Payer: Self-pay

## 2020-07-06 ENCOUNTER — Ambulatory Visit
Admission: RE | Admit: 2020-07-06 | Discharge: 2020-07-06 | Disposition: A | Discharge status: Home / Self Care | Payer: BC Managed Care – PPO | Source: Ambulatory Visit | Attending: Radiation Oncology | Admitting: Radiation Oncology

## 2020-07-06 ENCOUNTER — Ambulatory Visit: Payer: BC Managed Care – PPO | Admitting: Radiation Oncology

## 2020-07-06 DIAGNOSIS — C50411 Malignant neoplasm of upper-outer quadrant of right female breast: Secondary | ICD-10-CM | POA: Diagnosis not present

## 2020-07-07 ENCOUNTER — Ambulatory Visit: Payer: BC Managed Care – PPO

## 2020-07-08 ENCOUNTER — Other Ambulatory Visit: Payer: Self-pay

## 2020-07-08 ENCOUNTER — Ambulatory Visit
Admission: RE | Admit: 2020-07-08 | Discharge: 2020-07-08 | Disposition: A | Discharge status: Home / Self Care | Payer: BC Managed Care – PPO | Source: Ambulatory Visit | Attending: Radiation Oncology | Admitting: Radiation Oncology

## 2020-07-08 DIAGNOSIS — C50411 Malignant neoplasm of upper-outer quadrant of right female breast: Secondary | ICD-10-CM | POA: Diagnosis not present

## 2020-07-11 ENCOUNTER — Ambulatory Visit
Admission: RE | Admit: 2020-07-11 | Discharge: 2020-07-11 | Disposition: A | Discharge status: Home / Self Care | Payer: BC Managed Care – PPO | Source: Ambulatory Visit | Attending: Radiation Oncology | Admitting: Radiation Oncology

## 2020-07-11 ENCOUNTER — Other Ambulatory Visit: Payer: Self-pay

## 2020-07-11 DIAGNOSIS — C50411 Malignant neoplasm of upper-outer quadrant of right female breast: Secondary | ICD-10-CM | POA: Diagnosis not present

## 2020-07-12 ENCOUNTER — Ambulatory Visit
Admission: RE | Admit: 2020-07-12 | Discharge: 2020-07-12 | Disposition: A | Discharge status: Home / Self Care | Payer: BC Managed Care – PPO | Source: Ambulatory Visit | Attending: Radiation Oncology | Admitting: Radiation Oncology

## 2020-07-12 DIAGNOSIS — C50411 Malignant neoplasm of upper-outer quadrant of right female breast: Secondary | ICD-10-CM | POA: Diagnosis not present

## 2020-07-12 DIAGNOSIS — Z170 Estrogen receptor positive status [ER+]: Secondary | ICD-10-CM

## 2020-07-12 MED ORDER — RADIAPLEXRX EX GEL: Freq: Once | CUTANEOUS | Status: AC

## 2020-07-12 NOTE — Progress Notes (Signed)
Patient Care Team: Katherina Mires, MD as PCP - General (Family Medicine) Rockwell Germany, RN as Oncology Nurse Navigator Mauro Kaufmann, RN as Oncology Nurse Navigator Erroll Luna, MD as Consulting Physician (General Surgery) Nicholas Lose, MD as Consulting Physician (Hematology and Oncology) Gery Pray, MD as Consulting Physician (Radiation Oncology)  DIAGNOSIS:    ICD-10-CM   1. Malignant neoplasm of upper-outer quadrant of right breast in female, estrogen receptor positive ([place])  C50.411    Z17.0     SUMMARY OF ONCOLOGIC HISTORY: Oncology History  Malignant neoplasm of upper-outer quadrant of right breast in female, estrogen receptor positive ([place])  12/16/2019 Initial Diagnosis   Screening mammogram  showed a right breast asymmetry. Diagnostic mammogram showed a 2.9cm mass at the 9 o'clock position with 2 adjacent masses at the 10 and 8 o'clock positions, 0.9cm and 1.4cm respectively, and an enlarged lymph node, 1.3cm.  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 The report date is 12/31/2019.   The Multi-Cancer Panel offered by [facility] includes sequencing and/or deletion duplication testing of the following 85 genes: AIP, ALK, APC, ATM, AXIN2,BAP1,  BARD1, BLM, BMPR1A, BRCA1, BRCA2, BRIP1, CASR, CDC73, CDH1, CDK4, CDKN1B, CDKN1C, CDKN2A (p14ARF), CDKN2A (p16INK4a), CEBPA, CHEK2, CTNNA1, DICER1, DIS3L2, EGFR (c.2369C>T, p.Thr790Met variant only), EPCAM (Deletion/duplication testing only), FH, FLCN, GATA2, GPC3, GREM1 (Promoter region deletion/duplication testing only), HOXB13 (c.251G>A, p.Gly84Glu), HRAS, KIT, MAX, MEN1, MET, MITF (c.952G>A, p.Glu318Lys variant only), MLH1, MSH2, MSH3, MSH6, MUTYH, NBN, NF1,  NF2, NTHL1, PALB2, PDGFRA, PHOX2B, PMS2, POLD1, POLE, POT1, PRKAR1A, PTCH1, PTEN, RAD50, RAD51C, RAD51D, RB1, RECQL4, RET, RNF43, RUNX1, SDHAF2, SDHA (sequence changes only), SDHB, SDHC, SDHD, SMAD4, SMARCA4, SMARCB1, SMARCE1, STK11, SUFU, TERC, TERT, TMEM127, TP53, TSC1, TSC2, VHL, WRN and WT1.   01/06/2020 - 04/22/2020 Chemotherapy   Neoadjuvant chemo with TCH Perjeta x6 cycles       05/17/2020 Surgery   Right lumpectomy ([facility]): scattered foci of residual invasive lobular carcinoma, 0.6-1.0cm, grade 2, clear margins, 2 right axillary lymph nodes negative for carcinoma.   06/02/2020 -  Chemotherapy    Patient is on Treatment Plan: BREAST ADO-TRASTUZUMAB EMTANSINE (KADCYLA) Q21D        CHIEF COMPLIANT: Kadcylamaintenance  INTERVAL HISTORY: Michelle Anthony is a 55 y.o. with above-mentioned history of right breast cancertreated with neoadjuvantchemotherapy, lumpectomy,and iscurrently on Kadcylamaintenance. She presents to the clinictodayfor treatment.  ALLERGIES:  has No Known Allergies.  MEDICATIONS:  Current Outpatient Medications  Medication Sig Dispense Refill  . COLLAGEN PO Apply topically daily.    [name] gabapentin (NEURONTIN) 100 MG capsule Take 1 capsule (100 mg total) by mouth at bedtime. 30 capsule 3  . Multiple Vitamin (MULTIVITAMIN) tablet Take 1 tablet by mouth daily.    . Tretinoin (RETIN-A EX) Apply topically.     No current facility-administered medications for this visit.    PHYSICAL EXAMINATION: ECOG PERFORMANCE STATUS: 1 - Symptomatic but completely ambulatory  There were no vitals filed for this visit. There were no vitals filed for this visit.  LABORATORY DATA:  I have reviewed the data as listed CMP Latest Ref Rng & Units 06/22/2020 06/02/2020 05/11/2020  Glucose 70 - 99 mg/dL 106(H) 94 83  BUN 6 - 20 mg/dL 15 17 15  Creatinine 0.44 - 1.00 mg/dL 0.70 0.72 0.74  Sodium 135 - 145 mmol/L 141 145 141  Potassium 3.5 - 5.1 mmol/L 3.8 3.8 3.6  Chloride 98  - 111 mmol/L 105 108 106  CO2 22 - 32 mmol/L 28 26 28   Calcium 8.9 - 10.3 mg/dL 9.5 9.0 9.0  Total Protein 6.5 - 8.1 g/dL 7.2 6.5 5.9(L)  Total Bilirubin 0.3 - 1.2 mg/dL 0.3 0.4 0.4  Alkaline Phos 38 - 126 U/L 69 66 55  AST 15 - 41 U/L 25 13(L) 19  ALT 0 - 44 U/L 46(H) 16 25    Lab Results  Component Value Date   WBC 5.3 06/22/2020   HGB 12.5 06/22/2020   HCT 38.0 06/22/2020   MCV 96.7 06/22/2020   PLT 152 06/22/2020   NEUTROABS 2.8 06/22/2020    ASSESSMENT & PLAN:  Malignant neoplasm of upper-outer quadrant of right breast in female, estrogen receptor positive ([place]) /25/2021:Screening mammogram showed a right breast asymmetry. Diagnostic mammogram showed a 2.9cm mass at the 9 o'clock position with 2 adjacent masses at the 10 and 8 o'clock positions, 0.9cm and 1.4cm respectively, and an enlarged lymph node, 1.3cm. 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 Neoadjuvant chemotherapy with TCH Perjeta 6 cycles followed by Herceptinand Perjeta versus Kadcylamaintenance for 1 year(also could be randomized compass HER 2 trial) 2. Followed by breast conserving surgery if possible with sentinel lymph node study 3. Followed by adjuvant radiation therapy if patient had lumpectomy 4.Followed by adjuvant antiestrogen therapy SWOG S 1714 neuropathy clinical trial: No adverse effects from participating in the trial 12/31/2019: Breast MRI: Known malignancy 3.1 cm, with non-mass enhancement total measuring 4.2 cm. Additional masses 6 mm and 3 and 4 mm MRI guided biopsy on the 2 additional masses:Invasive lobular cancer with LCIS, retroareolar: LCIS grade 2 --------------------------------------------------------------------------------------------------------------------------------------------- 05/17/20:Right lumpectomy ([facility]): scattered foci of  residual invasive lobular carcinoma, 0.6-1.0cm, grade 2, clear margins, 2 right axillary lymph nodes negative for carcinoma.  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  Her workplace is now requiring her to come in person to the office which is also causing her more challenges.  Peripheral neuropathy: Numbness of the tips of her toes: Grade 1-2, because she has not noticed any improvement, we will increase the dosage of gabapentin to 300 mg at bedtime  Return to clinic in 3weeksfor cycle 4    No orders of the defined types were placed in this encounter.  The patient has a good understanding of the overall plan. she agrees with it. she will call with any problems that may develop before the next visit here.  Total time spent: 30 mins including face to face time and time spent for planning, charting and coordination of care  Rulon Eisenmenger, MD, MPH 07/13/2020  I, Molly Dorshimer, am acting as scribe for Dr. Nicholas Lose.  I have reviewed the above documentation for accuracy and completeness, and I agree with the above.

## 2020-07-12 NOTE — Assessment & Plan Note (Signed)
/  25/2021:Screening mammogram showed a right breast asymmetry. Diagnostic mammogram showed a 2.9cm mass at the 9 o'clock position with 2 adjacent masses at the 10 and 8 o'clock positions, 0.9cm and 1.4cm respectively, and an enlarged lymph node, 1.3cm. 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 Neoadjuvant chemotherapy with TCH Perjeta 6 cycles followed by Herceptinand Perjeta versus Kadcylamaintenance for 1 year(also could be randomized compass HER 2 trial) 2. Followed by breast conserving surgery if possible with sentinel lymph node study 3. Followed by adjuvant radiation therapy if patient had lumpectomy 4.Followed by adjuvant antiestrogen therapy SWOG S 1714 neuropathy clinical trial: No adverse effects from participating in the trial 12/31/2019: Breast MRI: Known malignancy 3.1 cm, with non-mass enhancement total measuring 4.2 cm. Additional masses 6 mm and 3 and 4 mm MRI guided biopsy on the 2 additional masses:Invasive lobular cancer with LCIS, retroareolar: LCIS grade 2 --------------------------------------------------------------------------------------------------------------------------------------------- 05/17/20:Right lumpectomy ([facility]): scattered foci of residual invasive lobular carcinoma, 0.6-1.0cm, grade 2, clear margins, 2 right axillary lymph nodes negative for carcinoma.  Current treatment: Kadcyla Maintenance, today cycle 3 Today's labs are not available yet because of technical issues with the lab machine.  Peripheral neuropathy: Numbness of the tips of her toes: Grade 1-2, will add gabapentin 100 mg at bedtime. Return to clinic in 3weeksfor cycle 4

## 2020-07-13 ENCOUNTER — Other Ambulatory Visit: Payer: Self-pay | Admitting: *Deleted

## 2020-07-13 ENCOUNTER — Other Ambulatory Visit: Payer: Self-pay

## 2020-07-13 ENCOUNTER — Ambulatory Visit
Admission: RE | Admit: 2020-07-13 | Discharge: 2020-07-13 | Disposition: A | Discharge status: Home / Self Care | Payer: BC Managed Care – PPO | Source: Ambulatory Visit | Attending: Radiation Oncology | Admitting: Radiation Oncology

## 2020-07-13 ENCOUNTER — Inpatient Hospital Stay (HOSPITAL_BASED_OUTPATIENT_CLINIC_OR_DEPARTMENT_OTHER): Payer: BC Managed Care – PPO | Admitting: Hematology and Oncology

## 2020-07-13 ENCOUNTER — Ambulatory Visit: Payer: BC Managed Care – PPO

## 2020-07-13 ENCOUNTER — Inpatient Hospital Stay: Payer: BC Managed Care – PPO

## 2020-07-13 VITALS — BP 108/52 | HR 75 | Temp 98.0°F | Resp 18

## 2020-07-13 DIAGNOSIS — Z170 Estrogen receptor positive status [ER+]: Secondary | ICD-10-CM

## 2020-07-13 DIAGNOSIS — Z5181 Encounter for therapeutic drug level monitoring: Secondary | ICD-10-CM

## 2020-07-13 DIAGNOSIS — C50411 Malignant neoplasm of upper-outer quadrant of right female breast: Secondary | ICD-10-CM

## 2020-07-13 DIAGNOSIS — Z79899 Other long term (current) drug therapy: Secondary | ICD-10-CM

## 2020-07-13 DIAGNOSIS — Z95828 Presence of other vascular implants and grafts: Secondary | ICD-10-CM

## 2020-07-13 LAB — CBC WITH DIFFERENTIAL/PLATELET
Abs Immature Granulocytes: 0.01 10*3/uL (ref 0.00–0.07)
Basophils Absolute: 0 10*3/uL (ref 0.0–0.1)
Basophils Relative: 1 %
Eosinophils Absolute: 0.1 10*3/uL (ref 0.0–0.5)
Eosinophils Relative: 2 %
HCT: 38.2 % (ref 36.0–46.0)
Hemoglobin: 12.8 g/dL (ref 12.0–15.0)
Immature Granulocytes: 0 %
Lymphocytes Relative: 25 %
Lymphs Abs: 1.4 10*3/uL (ref 0.7–4.0)
MCH: 31.6 pg (ref 26.0–34.0)
MCHC: 33.5 g/dL (ref 30.0–36.0)
MCV: 94.3 fL (ref 80.0–100.0)
Monocytes Absolute: 0.4 10*3/uL (ref 0.1–1.0)
Monocytes Relative: 7 %
Neutro Abs: 3.8 10*3/uL (ref 1.7–7.7)
Neutrophils Relative %: 65 %
Platelets: 240 10*3/uL (ref 150–400)
RBC: 4.05 MIL/uL (ref 3.87–5.11)
RDW: 11.9 % (ref 11.5–15.5)
WBC: 5.9 10*3/uL (ref 4.0–10.5)
nRBC: 0 % (ref 0.0–0.2)

## 2020-07-13 LAB — COMPREHENSIVE METABOLIC PANEL
ALT: 35 U/L (ref 0–44)
AST: 28 U/L (ref 15–41)
Albumin: 3.9 g/dL (ref 3.5–5.0)
Alkaline Phosphatase: 62 U/L (ref 38–126)
Anion gap: 12 (ref 5–15)
BUN: 16 mg/dL (ref 6–20)
CO2: 26 mmol/L (ref 22–32)
Calcium: 9.4 mg/dL (ref 8.9–10.3)
Chloride: 105 mmol/L (ref 98–111)
Creatinine, Ser: 0.68 mg/dL (ref 0.44–1.00)
GFR, Estimated: >60 mL/min (ref >60)
Glucose, Bld: 94 mg/dL (ref 70–99)
Potassium: 3.6 mmol/L (ref 3.5–5.1)
Sodium: 143 mmol/L (ref 135–145)
Total Bilirubin: 0.4 mg/dL (ref 0.3–1.2)
Total Protein: 7.3 g/dL (ref 6.5–8.1)

## 2020-07-13 MED ORDER — SODIUM CHLORIDE 0.9 % IV SOLN
Freq: Once | INTRAVENOUS | Status: AC
  Filled 2020-07-13: qty 250

## 2020-07-13 MED ORDER — ACETAMINOPHEN 325 MG PO TABS
ORAL_TABLET | ORAL | Status: AC
  Filled 2020-07-13: qty 2

## 2020-07-13 MED ORDER — IBUPROFEN 800 MG PO TABS: 800.0000 mg | ORAL_TABLET | Freq: Three times a day (TID) | ORAL | 0 refills | Status: DC | PRN

## 2020-07-13 MED ORDER — GABAPENTIN 300 MG PO CAPS: 300.0000 mg | ORAL_CAPSULE | Freq: Every day | ORAL | 3 refills | Status: DC

## 2020-07-13 MED ORDER — HEPARIN SOD (PORK) LOCK FLUSH 100 UNIT/ML IV SOLN
500.0000 [IU] | Freq: Once | INTRAVENOUS | Status: AC | PRN
  Administered 2020-07-13: 500 [IU]
  Filled 2020-07-13: qty 5

## 2020-07-13 MED ORDER — SODIUM CHLORIDE 0.9% FLUSH
10.0000 mL | INTRAVENOUS | Status: DC | PRN
  Administered 2020-07-13: 10 mL
  Filled 2020-07-13: qty 10

## 2020-07-13 MED ORDER — ACETAMINOPHEN 325 MG PO TABS
650.0000 mg | ORAL_TABLET | Freq: Once | ORAL | Status: AC
  Administered 2020-07-13: 650 mg via ORAL

## 2020-07-13 MED ORDER — DIPHENHYDRAMINE HCL 25 MG PO CAPS
50.0000 mg | ORAL_CAPSULE | Freq: Once | ORAL | Status: AC
  Administered 2020-07-13: 50 mg via ORAL

## 2020-07-13 MED ORDER — SODIUM CHLORIDE 0.9 % IV SOLN
3.6000 mg/kg | Freq: Once | INTRAVENOUS | Status: AC
  Administered 2020-07-13: 240 mg via INTRAVENOUS
  Filled 2020-07-13: qty 8

## 2020-07-13 MED ORDER — DIPHENHYDRAMINE HCL 25 MG PO CAPS
ORAL_CAPSULE | ORAL | Status: AC
  Filled 2020-07-13: qty 2

## 2020-07-13 MED ORDER — IBUPROFEN 200 MG PO TABS: 600.0000 mg | ORAL_TABLET | Freq: Three times a day (TID) | ORAL | Status: AC | PRN

## 2020-07-13 MED ORDER — SODIUM CHLORIDE 0.9% FLUSH
10.0000 mL | Freq: Once | INTRAVENOUS | Status: AC
  Administered 2020-07-13: 10 mL
  Filled 2020-07-13: qty 10

## 2020-07-13 NOTE — Progress Notes (Signed)
Per MD okay to treat with Echo from 04/13/20 today.  Verbal orders received for Echo to be completed prior to next tx.  Orders placed, apt scheduled and pt notified of date and time.

## 2020-07-13 NOTE — Patient Instructions (Signed)
Implanted Port Insertion, Care After This sheet gives you information about how to care for yourself after your procedure. Your health care provider may also give you more specific instructions. If you have problems or questions, contact your health care provider. What can I expect after the procedure? After the procedure, it is common to have:  Discomfort at the port insertion site.  Bruising on the skin over the port. This should improve over 3-4 days. Follow these instructions at home: Port care  After your port is placed, you will get a manufacturer's information card. The card has information about your port. Keep this card with you at all times.  Take care of the port as told by your health care provider. Ask your health care provider if you or a family member can get training for taking care of the port at home. A home health care nurse may also take care of the port.  Make sure to remember what type of port you have. Incision care  Follow instructions from your health care provider about how to take care of your port insertion site. Make sure you: ? Wash your hands with soap and water before and after you change your bandage (dressing). If soap and water are not available, use hand sanitizer. ? Change your dressing as told by your health care provider. ? Leave stitches (sutures), skin glue, or adhesive strips in place. These skin closures may need to stay in place for 2 weeks or longer. If adhesive strip edges start to loosen and curl up, you may trim the loose edges. Do not remove adhesive strips completely unless your health care provider tells you to do that.  Check your port insertion site every day for signs of infection. Check for: ? Redness, swelling, or pain. ? Fluid or blood. ? Warmth. ? Pus or a bad smell.      Activity  Return to your normal activities as told by your health care provider. Ask your health care provider what activities are safe for you.  Do not  lift anything that is heavier than 10 lb (4.5 kg), or the limit that you are told, until your health care provider says that it is safe. General instructions  Take over-the-counter and prescription medicines only as told by your health care provider.  Do not take baths, swim, or use a hot tub until your health care provider approves. Ask your health care provider if you may take showers. You may only be allowed to take sponge baths.  Do not drive for 24 hours if you were given a sedative during your procedure.  Wear a medical alert bracelet in case of an emergency. This will tell any health care providers that you have a port.  Keep all follow-up visits as told by your health care provider. This is important. Contact a health care provider if:  You cannot flush your port with saline as directed, or you cannot draw blood from the port.  You have a fever or chills.  You have redness, swelling, or pain around your port insertion site.  You have fluid or blood coming from your port insertion site.  Your port insertion site feels warm to the touch.  You have pus or a bad smell coming from the port insertion site. Get help right away if:  You have chest pain or shortness of breath.  You have bleeding from your port that you cannot control. Summary  Take care of the port as told by your   health care provider. Keep the manufacturer's information card with you at all times.  Change your dressing as told by your health care provider.  Contact a health care provider if you have a fever or chills or if you have redness, swelling, or pain around your port insertion site.  Keep all follow-up visits as told by your health care provider. This information is not intended to replace advice given to you by your health care provider. Make sure you discuss any questions you have with your health care provider. Document Revised: 10/29/2017 Document Reviewed: 10/29/2017 Elsevier Patient Education   2021 [employer]  

## 2020-07-13 NOTE — Patient Instructions (Signed)
Ado-Trastuzumab Emtansine for injection What is this medicine? ADO-TRASTUZUMAB EMTANSINE (ADD oh traz TOO zuh mab em TAN zine) is a monoclonal antibody combined with chemotherapy. It is used to treat breast cancer. This medicine may be used for other purposes; ask your health care provider or pharmacist if you have questions. COMMON BRAND NAME(S): Kadcyla What should I tell my health care provider before I take this medicine? They need to know if you have any of these conditions:  heart disease  heart failure  infection (especially a virus infection such as chickenpox, cold sores, or herpes)  liver disease  lung or breathing disease, like asthma  tingling of the fingers or toes, or other nerve disorder  an unusual or allergic reaction to ado-trastuzumab emtansine, other medications, foods, dyes, or preservatives  pregnant or trying to get pregnant  breast-feeding How should I use this medicine? This medicine is for infusion into a vein. It is given by a health care professional in a hospital or clinic setting. Talk to your pediatrician regarding the use of this medicine in children. Special care may be needed. Overdosage: If you think you have taken too much of this medicine contact a poison control center or emergency room at once. NOTE: This medicine is only for you. Do not share this medicine with others. What if I miss a dose? It is important not to miss your dose. Call your doctor or health care professional if you are unable to keep an appointment. What may interact with this medicine? This medicine may also interact with the following medications:  atazanavir  boceprevir  clarithromycin  delavirdine  indinavir  dalfopristin; quinupristin  isoniazid, INH  itraconazole  ketoconazole  nefazodone  nelfinavir  ritonavir  telaprevir  telithromycin  tipranavir  voriconazole This list may not describe all possible interactions. Give your health care  provider a list of all the medicines, herbs, non-prescription drugs, or dietary supplements you use. Also tell them if you smoke, drink alcohol, or use illegal drugs. Some items may interact with your medicine. What should I watch for while using this medicine? Visit your doctor for checks on your progress. This drug may make you feel generally unwell. This is not uncommon, as chemotherapy can affect healthy cells as well as cancer cells. Report any side effects. Continue your course of treatment even though you feel ill unless your doctor tells you to stop. You may need blood work done while you are taking this medicine. Call your doctor or health care professional for advice if you get a fever, chills or sore throat, or other symptoms of a cold or flu. Do not treat yourself. This drug decreases your body's ability to fight infections. Try to avoid being around people who are sick. Be careful brushing and flossing your teeth or using a toothpick because you may get an infection or bleed more easily. If you have any dental work done, tell your dentist you are receiving this medicine. Avoid taking products that contain aspirin, acetaminophen, ibuprofen, naproxen, or ketoprofen unless instructed by your doctor. These medicines may hide a fever. Do not become pregnant while taking this medicine or for 7 months after stopping it, men with female partners should use contraception during treatment and for 4 months after the last dose. Women should inform their doctor if they wish to become pregnant or think they might be pregnant. There is a potential for serious side effects to an unborn child. Do not breast-feed an infant while taking this medicine or   for 7 months after the last dose. Men who have a partner who is pregnant or who is capable of becoming pregnant should use a condom during sexual activity while taking this medicine and for 4 months after stopping it. Men should inform their doctors if they wish to  father a child. This medicine may lower sperm counts. Talk to your health care professional or pharmacist for more information. What side effects may I notice from receiving this medicine? Side effects that you should report to your doctor or health care professional as soon as possible:  allergic reactions like skin rash, itching or hives, swelling of the face, lips, or tongue  breathing problems  chest pain or palpitations  fever or chills, sore throat  general ill feeling or flu-like symptoms  light-colored stools  nausea, vomiting  pain, tingling, numbness in the hands or feet  signs and symptoms of bleeding such as bloody or black, tarry stools; red or dark-brown urine; spitting up blood or brown material that looks like coffee grounds; red spots on the skin; unusual bruising or bleeding from the eye, gums, or nose  swelling of the legs or ankles  yellowing of the eyes or skin Side effects that usually do not require medical attention (report to your doctor or health care professional if they continue or are bothersome):  changes in taste  constipation  dizziness  headache  joint pain  muscle pain  trouble sleeping  unusually weak or tired This list may not describe all possible side effects. Call your doctor for medical advice about side effects. You may report side effects to FDA at 1-800-FDA-1088. Where should I keep my medicine? This drug is given in a hospital or clinic and will not be stored at home. NOTE: This sheet is a summary. It may not cover all possible information. If you have questions about this medicine, talk to your doctor, pharmacist, or health care provider.  2021 Elsevier/Gold Standard (2017-08-30 10:03:15)

## 2020-07-14 ENCOUNTER — Ambulatory Visit: Payer: BC Managed Care – PPO

## 2020-07-14 ENCOUNTER — Ambulatory Visit
Admission: RE | Admit: 2020-07-14 | Discharge: 2020-07-14 | Disposition: A | Discharge status: Home / Self Care | Payer: BC Managed Care – PPO | Source: Ambulatory Visit | Attending: Radiation Oncology | Admitting: Radiation Oncology

## 2020-07-14 DIAGNOSIS — C50411 Malignant neoplasm of upper-outer quadrant of right female breast: Secondary | ICD-10-CM | POA: Diagnosis not present

## 2020-07-15 ENCOUNTER — Ambulatory Visit: Payer: BC Managed Care – PPO

## 2020-07-15 ENCOUNTER — Ambulatory Visit
Admission: RE | Admit: 2020-07-15 | Discharge: 2020-07-15 | Disposition: A | Discharge status: Home / Self Care | Payer: BC Managed Care – PPO | Source: Ambulatory Visit | Attending: Radiation Oncology | Admitting: Radiation Oncology

## 2020-07-15 ENCOUNTER — Other Ambulatory Visit: Payer: Self-pay

## 2020-07-15 ENCOUNTER — Telehealth: Payer: Self-pay | Admitting: Hematology and Oncology

## 2020-07-15 DIAGNOSIS — C50411 Malignant neoplasm of upper-outer quadrant of right female breast: Secondary | ICD-10-CM | POA: Insufficient documentation

## 2020-07-15 DIAGNOSIS — Z510 Encounter for antineoplastic radiation therapy: Secondary | ICD-10-CM | POA: Insufficient documentation

## 2020-07-15 DIAGNOSIS — Z170 Estrogen receptor positive status [ER+]: Secondary | ICD-10-CM | POA: Insufficient documentation

## 2020-07-15 NOTE — Telephone Encounter (Signed)
Scheduled per 3/30 los. Please give pt an updated appt calendar per next visit appt notes

## 2020-07-18 ENCOUNTER — Encounter: Payer: Self-pay | Admitting: *Deleted

## 2020-07-18 ENCOUNTER — Other Ambulatory Visit: Payer: Self-pay

## 2020-07-18 ENCOUNTER — Ambulatory Visit
Admission: RE | Admit: 2020-07-18 | Discharge: 2020-07-18 | Disposition: A | Discharge status: Home / Self Care | Payer: BC Managed Care – PPO | Source: Ambulatory Visit | Attending: Radiation Oncology | Admitting: Radiation Oncology

## 2020-07-18 DIAGNOSIS — C50411 Malignant neoplasm of upper-outer quadrant of right female breast: Secondary | ICD-10-CM | POA: Diagnosis not present

## 2020-07-19 ENCOUNTER — Ambulatory Visit
Admission: RE | Admit: 2020-07-19 | Discharge: 2020-07-19 | Disposition: A | Discharge status: Home / Self Care | Payer: BC Managed Care – PPO | Source: Ambulatory Visit | Attending: Radiation Oncology | Admitting: Radiation Oncology

## 2020-07-19 DIAGNOSIS — C50411 Malignant neoplasm of upper-outer quadrant of right female breast: Secondary | ICD-10-CM

## 2020-07-19 DIAGNOSIS — Z170 Estrogen receptor positive status [ER+]: Secondary | ICD-10-CM

## 2020-07-19 MED ORDER — RADIAPLEXRX EX GEL: Freq: Once | CUTANEOUS | Status: AC

## 2020-07-20 ENCOUNTER — Ambulatory Visit: Payer: BC Managed Care – PPO

## 2020-07-20 ENCOUNTER — Ambulatory Visit
Admission: RE | Admit: 2020-07-20 | Discharge: 2020-07-20 | Disposition: A | Discharge status: Home / Self Care | Payer: BC Managed Care – PPO | Source: Ambulatory Visit | Attending: Radiation Oncology | Admitting: Radiation Oncology

## 2020-07-20 ENCOUNTER — Other Ambulatory Visit: Payer: Self-pay

## 2020-07-20 DIAGNOSIS — C50411 Malignant neoplasm of upper-outer quadrant of right female breast: Secondary | ICD-10-CM | POA: Diagnosis not present

## 2020-07-21 ENCOUNTER — Ambulatory Visit: Payer: BC Managed Care – PPO

## 2020-07-21 ENCOUNTER — Ambulatory Visit
Admission: RE | Admit: 2020-07-21 | Discharge: 2020-07-21 | Disposition: A | Discharge status: Home / Self Care | Payer: BC Managed Care – PPO | Source: Ambulatory Visit | Attending: Radiation Oncology | Admitting: Radiation Oncology

## 2020-07-21 DIAGNOSIS — C50411 Malignant neoplasm of upper-outer quadrant of right female breast: Secondary | ICD-10-CM | POA: Diagnosis not present

## 2020-07-22 ENCOUNTER — Other Ambulatory Visit: Payer: Self-pay

## 2020-07-22 ENCOUNTER — Ambulatory Visit
Admission: RE | Admit: 2020-07-22 | Discharge: 2020-07-22 | Disposition: A | Discharge status: Home / Self Care | Payer: BC Managed Care – PPO | Source: Ambulatory Visit | Attending: Radiation Oncology | Admitting: Radiation Oncology

## 2020-07-22 ENCOUNTER — Encounter: Payer: Self-pay | Admitting: Radiation Oncology

## 2020-07-22 ENCOUNTER — Ambulatory Visit (HOSPITAL_COMMUNITY)
Admission: RE | Admit: 2020-07-22 | Discharge: 2020-07-22 | Disposition: A | Discharge status: Home / Self Care | Payer: BC Managed Care – PPO | Source: Ambulatory Visit | Attending: Hematology and Oncology | Admitting: Hematology and Oncology

## 2020-07-22 DIAGNOSIS — Z0189 Encounter for other specified special examinations: Secondary | ICD-10-CM | POA: Diagnosis not present

## 2020-07-22 DIAGNOSIS — Z79899 Other long term (current) drug therapy: Secondary | ICD-10-CM | POA: Diagnosis not present

## 2020-07-22 DIAGNOSIS — Z01818 Encounter for other preprocedural examination: Secondary | ICD-10-CM | POA: Diagnosis present

## 2020-07-22 DIAGNOSIS — Z5181 Encounter for therapeutic drug level monitoring: Secondary | ICD-10-CM | POA: Insufficient documentation

## 2020-07-22 DIAGNOSIS — C50411 Malignant neoplasm of upper-outer quadrant of right female breast: Secondary | ICD-10-CM | POA: Diagnosis not present

## 2020-07-22 LAB — ECHOCARDIOGRAM COMPLETE
Area-P 1/2: 2.42 cm2
S' Lateral: 3.07 cm

## 2020-07-22 NOTE — Progress Notes (Signed)
  Echocardiogram 2D Echocardiogram has been performed.  Michelle Anthony 07/22/2020, 10:08 AM

## 2020-07-27 ENCOUNTER — Encounter: Payer: Self-pay | Admitting: *Deleted

## 2020-08-03 ENCOUNTER — Other Ambulatory Visit: Payer: Self-pay

## 2020-08-03 ENCOUNTER — Inpatient Hospital Stay: Payer: BC Managed Care – PPO

## 2020-08-03 ENCOUNTER — Inpatient Hospital Stay (HOSPITAL_BASED_OUTPATIENT_CLINIC_OR_DEPARTMENT_OTHER): Payer: BC Managed Care – PPO | Admitting: Medical

## 2020-08-03 ENCOUNTER — Inpatient Hospital Stay: Payer: BC Managed Care – PPO | Attending: Medical

## 2020-08-03 VITALS — BP 119/77 | HR 96 | Temp 97.4°F | Resp 18 | Ht 65.0 in | Wt 141.9 lb

## 2020-08-03 DIAGNOSIS — Z87891 Personal history of nicotine dependence: Secondary | ICD-10-CM | POA: Insufficient documentation

## 2020-08-03 DIAGNOSIS — Z170 Estrogen receptor positive status [ER+]: Secondary | ICD-10-CM

## 2020-08-03 DIAGNOSIS — Z923 Personal history of irradiation: Secondary | ICD-10-CM | POA: Insufficient documentation

## 2020-08-03 DIAGNOSIS — Z808 Family history of malignant neoplasm of other organs or systems: Secondary | ICD-10-CM | POA: Diagnosis not present

## 2020-08-03 DIAGNOSIS — C50411 Malignant neoplasm of upper-outer quadrant of right female breast: Secondary | ICD-10-CM | POA: Insufficient documentation

## 2020-08-03 DIAGNOSIS — Z803 Family history of malignant neoplasm of breast: Secondary | ICD-10-CM | POA: Diagnosis not present

## 2020-08-03 DIAGNOSIS — Z9221 Personal history of antineoplastic chemotherapy: Secondary | ICD-10-CM | POA: Insufficient documentation

## 2020-08-03 DIAGNOSIS — G620 Drug-induced polyneuropathy: Secondary | ICD-10-CM | POA: Insufficient documentation

## 2020-08-03 DIAGNOSIS — Z5112 Encounter for antineoplastic immunotherapy: Secondary | ICD-10-CM | POA: Diagnosis not present

## 2020-08-03 DIAGNOSIS — Z95828 Presence of other vascular implants and grafts: Secondary | ICD-10-CM

## 2020-08-03 LAB — CBC WITH DIFFERENTIAL/PLATELET
Abs Immature Granulocytes: 0.01 10*3/uL (ref 0.00–0.07)
Basophils Absolute: 0.1 10*3/uL (ref 0.0–0.1)
Basophils Relative: 1 %
Eosinophils Absolute: 0.2 10*3/uL (ref 0.0–0.5)
Eosinophils Relative: 4 %
HCT: 38.2 % (ref 36.0–46.0)
Hemoglobin: 13.1 g/dL (ref 12.0–15.0)
Immature Granulocytes: 0 %
Lymphocytes Relative: 30 %
Lymphs Abs: 1.6 10*3/uL (ref 0.7–4.0)
MCH: 31.6 pg (ref 26.0–34.0)
MCHC: 34.3 g/dL (ref 30.0–36.0)
MCV: 92.3 fL (ref 80.0–100.0)
Monocytes Absolute: 0.5 10*3/uL (ref 0.1–1.0)
Monocytes Relative: 10 %
Neutro Abs: 2.9 10*3/uL (ref 1.7–7.7)
Neutrophils Relative %: 55 %
Platelets: 179 10*3/uL (ref 150–400)
RBC: 4.14 MIL/uL (ref 3.87–5.11)
RDW: 12.2 % (ref 11.5–15.5)
WBC: 5.2 10*3/uL (ref 4.0–10.5)
nRBC: 0 % (ref 0.0–0.2)

## 2020-08-03 LAB — COMPREHENSIVE METABOLIC PANEL
ALT: 35 U/L (ref 0–44)
AST: 27 U/L (ref 15–41)
Albumin: 4.1 g/dL (ref 3.5–5.0)
Alkaline Phosphatase: 68 U/L (ref 38–126)
Anion gap: 10 (ref 5–15)
BUN: 18 mg/dL (ref 6–20)
CO2: 29 mmol/L (ref 22–32)
Calcium: 9.7 mg/dL (ref 8.9–10.3)
Chloride: 104 mmol/L (ref 98–111)
Creatinine, Ser: 0.78 mg/dL (ref 0.44–1.00)
GFR, Estimated: >60 mL/min (ref >60)
Glucose, Bld: 100 mg/dL — ABNORMAL HIGH (ref 70–99)
Potassium: 3.8 mmol/L (ref 3.5–5.1)
Sodium: 143 mmol/L (ref 135–145)
Total Bilirubin: 0.3 mg/dL (ref 0.3–1.2)
Total Protein: 7.3 g/dL (ref 6.5–8.1)

## 2020-08-03 MED ORDER — ACETAMINOPHEN 325 MG PO TABS
ORAL_TABLET | ORAL | Status: AC
  Filled 2020-08-03: qty 2

## 2020-08-03 MED ORDER — SODIUM CHLORIDE 0.9% FLUSH
10.0000 mL | INTRAVENOUS | Status: DC | PRN
  Administered 2020-08-03: 10 mL
  Filled 2020-08-03: qty 10

## 2020-08-03 MED ORDER — SODIUM CHLORIDE 0.9 % IV SOLN
3.6000 mg/kg | Freq: Once | INTRAVENOUS | Status: AC
  Administered 2020-08-03: 240 mg via INTRAVENOUS
  Filled 2020-08-03: qty 8

## 2020-08-03 MED ORDER — SODIUM CHLORIDE 0.9% FLUSH
10.0000 mL | Freq: Once | INTRAVENOUS | Status: AC
Start: 2020-08-03 — End: 2020-08-03
  Administered 2020-08-03: 10 mL
  Filled 2020-08-03: qty 10

## 2020-08-03 MED ORDER — DIPHENHYDRAMINE HCL 25 MG PO CAPS
ORAL_CAPSULE | ORAL | Status: AC
  Filled 2020-08-03: qty 1

## 2020-08-03 MED ORDER — SODIUM CHLORIDE 0.9 % IV SOLN
Freq: Once | INTRAVENOUS | Status: AC
  Filled 2020-08-03: qty 250

## 2020-08-03 MED ORDER — DIPHENHYDRAMINE HCL 25 MG PO CAPS
50.0000 mg | ORAL_CAPSULE | Freq: Once | ORAL | Status: AC
  Administered 2020-08-03: 50 mg via ORAL

## 2020-08-03 MED ORDER — ACETAMINOPHEN 325 MG PO TABS
650.0000 mg | ORAL_TABLET | Freq: Once | ORAL | Status: AC
  Administered 2020-08-03: 650 mg via ORAL

## 2020-08-03 MED ORDER — HEPARIN SOD (PORK) LOCK FLUSH 100 UNIT/ML IV SOLN
500.0000 [IU] | Freq: Once | INTRAVENOUS | Status: AC | PRN
  Administered 2020-08-03: 500 [IU]
  Filled 2020-08-03: qty 5

## 2020-08-03 NOTE — Progress Notes (Signed)
[facility] Progress Note   Michelle Anthony [id number] 1966/01/17 55 y.o.  Michelle Anthony is managed by Dr. Nicholas Lose  Actively treated with chemotherapy/immunotherapy/hormonal therapy: yes  Current therapy: Kadcyla  Last treated: 07/13/2020 (cycle 3, day 1)  Next scheduled appointment with provider: 08/25/2020  Assessment: Plan:    Malignant neoplasm of upper-outer quadrant of right breast in female, estrogen receptor positive ([place])  Drug-induced polyneuropathy ([place])   ER positive malignant neoplasm of the right breast: The patient is status post radiation therapy which she just completed.  We will proceed with cycle 4, day 1 of Kadcyla and will have her return as scheduled on 08/25/2020.  Peripheral neuropathy: The patient continues on gabapentin 300 mg p.o. nightly.  I offered to increase this.  She declines a dose increase at this time.  Please see After Visit Summary for patient specific instructions.  Future Appointments  Date Time Provider [place]  08/15/2020 11:45 AM Toribio Harbour, PT [facility] None  08/22/2020 10:30 AM Gery Pray, MD [facility] None  08/25/2020  9:30 AM [facility] None  08/25/2020 10:00 AM Nicholas Lose, MD [facility] None  08/25/2020 11:00 AM [facility] INFUSION [facility] None  09/15/2020  9:30 AM [facility] FLUSH [facility] None  09/15/2020 10:00 AM Nicholas Lose, MD [facility] None  09/15/2020 11:00 AM [facility] INFUSION [facility] None  12/22/2020  3:30 PM Salvadore Dom, MD [facility] None    No orders of the defined types were placed in this encounter.      Subjective:   Patient ID:  Michelle Anthony is a 55 y.o. (DOB 01-Apr-1966) female.  Chief Complaint: No chief complaint on file.   HPI Michelle Anthony  is a 55 y.o. female with a diagnosis of an ER positive malignant neoplasm of the right breast.  She is followed by Dr. Nicholas Lose and presents for cycle 4, day 1 of  Kadcyla and will have her return as scheduled on 08/25/2020.  She was recently started on gabapentin 3 weeks ago at 300 mg p.o. nightly for bilateral lower extremity neuropathy.  She reports that her neuropathy may be slightly better at best.  She just completed radiation.  She denies any other issues of concern today.  She denies fevers, chills, sweats, nausea, vomiting, constipation, or diarrhea.    Medications: I have reviewed the patient's current medications.  Allergies: No Known Allergies  Past Medical History:  Diagnosis Date  . Erythema nodosum   . Family history of breast cancer   . Family history of melanoma   . Frequent UTI   . Frozen shoulder   . STD (sexually transmitted disease)    H/O HSV II    Past Surgical History:  Procedure Laterality Date  . BOTOX INJECTION    . BREAST LUMPECTOMY WITH RADIOACTIVE SEED AND SENTINEL LYMPH NODE BIOPSY Right 05/17/2020   Procedure: RIGHT BREAST LUMPECTOMY WITH RADIOACTIVE SEED x5 AND SENTINEL LYMPH NODE MAPPING;  Surgeon: Erroll Luna, MD;  Location: [place];  Service: General;  Laterality: Right;  . CLOSED MANIPULATION SHOULDER    . PORTACATH PLACEMENT Right 01/05/2020   Procedure: INSERTION PORT-A-CATH WITH ULTRASOUND GUIDANCE;  Surgeon: Erroll Luna, MD;  Location: [place];  Service: General;  Laterality: Right;    Family History  Problem Relation Age of Onset  . Thyroid disease Mother   . Osteopenia Mother   . Colon polyps Mother        unknown number  .  Thyroid disease Sister   . Cancer Father 5       appendiceal cancer--stage IV  . Diabetes Father 6       thin  . Thyroid disease Maternal Grandmother   . Breast cancer Maternal Aunt 70  . Breast cancer Paternal Aunt 75  . Diabetes Paternal Aunt   . Diabetes Paternal Uncle   . Liver cancer Paternal Grandmother 43       died of liver cancer at 59  . Breast cancer Cousin 3       M 1st cousin, died at 53  . Melanoma Maternal  Uncle 50       blonde/blue eyes  . Melanoma Cousin        dx <50, maternal 1st cousin    Social History   Socioeconomic History  . Marital status: Divorced    Spouse name: Not on file  . Number of children: Not on file  . Years of education: Not on file  . Highest education level: Not on file  Occupational History  . Not on file  Tobacco Use  . Smoking status: Former Smoker    Types: Cigarettes    Quit date: 08/26/1982    Years since quitting: 37.9  . Smokeless tobacco: Never Used  Substance and Sexual Activity  . Alcohol use: Yes    Comment: chemo changed taste buds, currently not drinking  . Drug use: No  . Sexual activity: Yes    Partners: Male    Birth control/protection: Other-see comments    Comment: vasectomy  Other Topics Concern  . Not on file  Social History Narrative  . Not on file   Social Determinants of Health   Financial Resource Strain: Low Risk   . Difficulty of Paying Living Expenses: Not hard at all  Food Insecurity: No Food Insecurity  . Worried About Charity fundraiser in the Last Year: Never true  . Ran Out of Food in the Last Year: Never true  Transportation Needs: No Transportation Needs  . Lack of Transportation (Medical): No  . Lack of Transportation (Non-Medical): No  Physical Activity: Not on file  Stress: Not on file  Social Connections: Not on file  Intimate Partner Violence: Not on file    Past Medical History, Surgical history, Social history, and Family history were reviewed and updated as appropriate.   Please see review of systems for further details on the patient's review from today.   Review of Systems:  Review of Systems  Constitutional: Negative for chills, diaphoresis and fever.  HENT: Negative for trouble swallowing and voice change.   Respiratory: Negative for cough, chest tightness, shortness of breath and wheezing.   Cardiovascular: Negative for chest pain and palpitations.  Gastrointestinal: Negative for  abdominal pain, constipation, diarrhea, nausea and vomiting.  Musculoskeletal: Negative for back pain and myalgias.  Neurological: Positive for numbness (Bilateral lower extremity neuropathy.). Negative for dizziness, light-headedness and headaches.    Objective:   Physical Exam:  BP 119/77 (BP Location: Left Arm, Patient Position: Sitting)   Pulse 96   Temp (!) 97.4 F (36.3 C) (Tympanic)   Resp 18   Ht 5\' 5"  (1.651 m)   Wt 141 lb 14.4 oz (64.4 kg)   LMP 03/11/2018   SpO2 98%   BMI 23.61 kg/m  ECOG: 0  Physical Exam Constitutional:      General: She is not in acute distress.    Appearance: She is not diaphoretic.  HENT:  Head: Normocephalic and atraumatic.  Eyes:     General: No scleral icterus.       Right eye: No discharge.        Left eye: No discharge.     Conjunctiva/sclera: Conjunctivae normal.  Cardiovascular:     Rate and Rhythm: Normal rate and regular rhythm.     Heart sounds: Normal heart sounds. No murmur heard. No friction rub. No gallop.   Pulmonary:     Effort: Pulmonary effort is normal. No respiratory distress.     Breath sounds: Normal breath sounds. No wheezing or rales.  Skin:    General: Skin is warm and dry.     Findings: No erythema or rash.     Comments: Pain right chest wall Port-A-Cath is noted.  Neurological:     Mental Status: She is alert.     Coordination: Coordination normal.     Gait: Gait normal.  Psychiatric:        Mood and Affect: Mood normal.        Behavior: Behavior normal.        Thought Content: Thought content normal.        Judgment: Judgment normal.     Lab Review:     Component Value Date/Time   NA 143 08/03/2020 0838   NA 141 12/16/2019 1610   K 3.8 08/03/2020 0838   CL 104 08/03/2020 0838   CO2 29 08/03/2020 0838   GLUCOSE 100 (H) 08/03/2020 0838   BUN 18 08/03/2020 0838   BUN 21 12/16/2019 1610   CREATININE 0.78 08/03/2020 0838   CREATININE 0.67 04/20/2020 0922   CREATININE 0.86 10/31/2015 1655    CALCIUM 9.7 08/03/2020 0838   PROT 7.3 08/03/2020 0838   PROT 7.1 12/16/2019 1610   ALBUMIN 4.1 08/03/2020 0838   ALBUMIN 4.7 12/16/2019 1610   AST 27 08/03/2020 0838   AST 19 04/20/2020 0922   ALT 35 08/03/2020 0838   ALT 35 04/20/2020 0922   ALKPHOS 68 08/03/2020 0838   BILITOT 0.3 08/03/2020 0838   BILITOT 0.4 04/20/2020 0922   GFRNONAA >60 08/03/2020 0838   GFRNONAA >60 04/20/2020 0922   GFRAA >60 01/13/2020 1031       Component Value Date/Time   WBC 5.2 08/03/2020 0838   RBC 4.14 08/03/2020 0838   HGB 13.1 08/03/2020 0838   HGB 11.3 (L) 04/20/2020 0922   HGB 13.9 12/16/2019 1610   HGB 12.4 10/11/2014 1519   HCT 38.2 08/03/2020 0838   HCT 41.9 12/16/2019 1610   PLT 179 08/03/2020 0838   PLT 240 04/20/2020 0922   PLT 261 12/16/2019 1610   MCV 92.3 08/03/2020 0838   MCV 94 12/16/2019 1610   MCH 31.6 08/03/2020 0838   MCHC 34.3 08/03/2020 0838   RDW 12.2 08/03/2020 0838   RDW 12.0 12/16/2019 1610   LYMPHSABS 1.6 08/03/2020 0838   MONOABS 0.5 08/03/2020 0838   EOSABS 0.2 08/03/2020 0838   BASOSABS 0.1 08/03/2020 0838   -------------------------------  Imaging from last 24 hours (if applicable):  Radiology interpretation: ECHOCARDIOGRAM COMPLETE  Result Date: 07/22/2020    ECHOCARDIOGRAM REPORT   Patient Name:   Michelle Anthony Date of Exam: 07/22/2020 Medical Rec #:  [id number]         Height:       65.0 in Accession #:    [id number]        Weight:       141.4 lb Date of Birth:  21-Sep-1965  BSA:          1.707 m Patient Age:    30 years          BP:           108/52 mmHg Patient Gender: F                 HR:           57 bpm. Exam Location:  Outpatient Procedure: 2D Echo, Color Doppler, Cardiac Doppler and Strain Analysis Indications:    Chemo Z09  History:        Patient has prior history of Echocardiogram examinations, most                 recent 04/13/2020.  Sonographer:    Bernadene Person RDCS Referring Phys: [id number] Nicholas Lose IMPRESSIONS  1. Left  ventricular ejection fraction, by estimation, is 55 to 60%. The left ventricle has normal function. The left ventricle has no regional wall motion abnormalities. Left ventricular diastolic parameters were normal. The average left ventricular global longitudinal strain is -22.0 %. The global longitudinal strain is normal.  2. Right ventricular systolic function is normal. The right ventricular size is normal. There is normal pulmonary artery systolic pressure.  3. The mitral valve is normal in structure. Trivial mitral valve regurgitation. No evidence of mitral stenosis.  4. The aortic valve is tricuspid. Aortic valve regurgitation is trivial. No aortic stenosis is present.  5. The inferior vena cava is normal in size with greater than 50% respiratory variability, suggesting right atrial pressure of 3 mmHg. Comparison(s): No significant change from prior study. Conclusion(s)/Recommendation(s): Normal biventricular function without evidence of hemodynamically significant valvular heart disease. FINDINGS  Left Ventricle: Left ventricular ejection fraction, by estimation, is 55 to 60%. The left ventricle has normal function. The left ventricle has no regional wall motion abnormalities. The average left ventricular global longitudinal strain is -22.0 %. The global longitudinal strain is normal. The left ventricular internal cavity size was normal in size. There is no left ventricular hypertrophy. Left ventricular diastolic parameters were normal. Right Ventricle: The right ventricular size is normal. No increase in right ventricular wall thickness. Right ventricular systolic function is normal. There is normal pulmonary artery systolic pressure. The tricuspid regurgitant velocity is 2.17 m/s, and  with an assumed right atrial pressure of 3 mmHg, the estimated right ventricular systolic pressure is 26.9 mmHg. Left Atrium: Left atrial size was normal in size. Right Atrium: Right atrial size was normal in size. Pericardium:  There is no evidence of pericardial effusion. Mitral Valve: The mitral valve is normal in structure. Trivial mitral valve regurgitation. No evidence of mitral valve stenosis. Tricuspid Valve: The tricuspid valve is normal in structure. Tricuspid valve regurgitation is mild . No evidence of tricuspid stenosis. Aortic Valve: The aortic valve is tricuspid. Aortic valve regurgitation is trivial. No aortic stenosis is present. Pulmonic Valve: The pulmonic valve was not well visualized. Pulmonic valve regurgitation is trivial. Aorta: The aortic root, ascending aorta, aortic arch and descending aorta are all structurally normal, with no evidence of dilitation or obstruction. Venous: The inferior vena cava is normal in size with greater than 50% respiratory variability, suggesting right atrial pressure of 3 mmHg. IAS/Shunts: The atrial septum is grossly normal.  LEFT VENTRICLE PLAX 2D LVIDd:         4.20 cm  Diastology LVIDs:         3.07 cm  LV e' medial:    9.46 cm/s LV PW:  0.70 cm  LV E/e' medial:  6.2 LV IVS:        0.60 cm  LV e' lateral:   14.70 cm/s LVOT diam:     2.00 cm  LV E/e' lateral: 4.0 LV SV:         70 LV SV Index:   41       2D Longitudinal Strain LVOT Area:     3.14 cm 2D Strain GLS (A2C):   -21.7 %                         2D Strain GLS (A3C):   -20.5 %                         2D Strain GLS (A4C):   -23.8 %                         2D Strain GLS Avg:     -22.0 % RIGHT VENTRICLE RV S prime:     12.10 cm/s TAPSE (M-mode): 2.2 cm LEFT ATRIUM             Index       RIGHT ATRIUM           Index LA diam:        2.30 cm 1.35 cm/m  RA Area:     13.40 cm LA Vol (A2C):   23.0 ml 13.47 ml/m RA Volume:   30.90 ml  18.10 ml/m LA Vol (A4C):   18.8 ml 11.01 ml/m LA Biplane Vol: 22.2 ml 13.00 ml/m  AORTIC VALVE LVOT Vmax:   108.00 cm/s LVOT Vmean:  76.300 cm/s LVOT VTI:    0.224 m  AORTA Ao Root diam: 3.20 cm Ao Asc diam:  3.40 cm MITRAL VALVE               TRICUSPID VALVE MV Area (PHT): 2.42 cm    TR Peak  grad:   18.8 mmHg MV Decel Time: 313 msec    TR Vmax:        217.00 cm/s MV E velocity: 59.00 cm/s MV A velocity: 60.70 cm/s  SHUNTS MV E/A ratio:  0.97        Systemic VTI:  0.22 m                            Systemic Diam: 2.00 cm Buford Dresser MD Electronically signed by Buford Dresser MD Signature Date/Time: 07/22/2020/8:17:36 PM    Final       OK to treat.  Sandi Mealy, MHS, PA-C Physician Assistant

## 2020-08-03 NOTE — Patient Instructions (Signed)
Ado-Trastuzumab Emtansine for injection What is this medicine? ADO-TRASTUZUMAB EMTANSINE (ADD oh traz TOO zuh mab em TAN zine) is a monoclonal antibody combined with chemotherapy. It is used to treat breast cancer. This medicine may be used for other purposes; ask your health care provider or pharmacist if you have questions. COMMON BRAND NAME(S): Kadcyla What should I tell my health care provider before I take this medicine? They need to know if you have any of these conditions:  heart disease  heart failure  infection (especially a virus infection such as chickenpox, cold sores, or herpes)  liver disease  lung or breathing disease, like asthma  tingling of the fingers or toes, or other nerve disorder  an unusual or allergic reaction to ado-trastuzumab emtansine, other medications, foods, dyes, or preservatives  pregnant or trying to get pregnant  breast-feeding How should I use this medicine? This medicine is for infusion into a vein. It is given by a health care professional in a hospital or clinic setting. Talk to your pediatrician regarding the use of this medicine in children. Special care may be needed. Overdosage: If you think you have taken too much of this medicine contact a poison control center or emergency room at once. NOTE: This medicine is only for you. Do not share this medicine with others. What if I miss a dose? It is important not to miss your dose. Call your doctor or health care professional if you are unable to keep an appointment. What may interact with this medicine? This medicine may also interact with the following medications:  atazanavir  boceprevir  clarithromycin  delavirdine  indinavir  dalfopristin; quinupristin  isoniazid, INH  itraconazole  ketoconazole  nefazodone  nelfinavir  ritonavir  telaprevir  telithromycin  tipranavir  voriconazole This list may not describe all possible interactions. Give your health care  provider a list of all the medicines, herbs, non-prescription drugs, or dietary supplements you use. Also tell them if you smoke, drink alcohol, or use illegal drugs. Some items may interact with your medicine. What should I watch for while using this medicine? Visit your doctor for checks on your progress. This drug may make you feel generally unwell. This is not uncommon, as chemotherapy can affect healthy cells as well as cancer cells. Report any side effects. Continue your course of treatment even though you feel ill unless your doctor tells you to stop. You may need blood work done while you are taking this medicine. Call your doctor or health care professional for advice if you get a fever, chills or sore throat, or other symptoms of a cold or flu. Do not treat yourself. This drug decreases your body's ability to fight infections. Try to avoid being around people who are sick. Be careful brushing and flossing your teeth or using a toothpick because you may get an infection or bleed more easily. If you have any dental work done, tell your dentist you are receiving this medicine. Avoid taking products that contain aspirin, acetaminophen, ibuprofen, naproxen, or ketoprofen unless instructed by your doctor. These medicines may hide a fever. Do not become pregnant while taking this medicine or for 7 months after stopping it, men with female partners should use contraception during treatment and for 4 months after the last dose. Women should inform their doctor if they wish to become pregnant or think they might be pregnant. There is a potential for serious side effects to an unborn child. Do not breast-feed an infant while taking this medicine or   for 7 months after the last dose. Men who have a partner who is pregnant or who is capable of becoming pregnant should use a condom during sexual activity while taking this medicine and for 4 months after stopping it. Men should inform their doctors if they wish to  father a child. This medicine may lower sperm counts. Talk to your health care professional or pharmacist for more information. What side effects may I notice from receiving this medicine? Side effects that you should report to your doctor or health care professional as soon as possible:  allergic reactions like skin rash, itching or hives, swelling of the face, lips, or tongue  breathing problems  chest pain or palpitations  fever or chills, sore throat  general ill feeling or flu-like symptoms  light-colored stools  nausea, vomiting  pain, tingling, numbness in the hands or feet  signs and symptoms of bleeding such as bloody or black, tarry stools; red or dark-brown urine; spitting up blood or brown material that looks like coffee grounds; red spots on the skin; unusual bruising or bleeding from the eye, gums, or nose  swelling of the legs or ankles  yellowing of the eyes or skin Side effects that usually do not require medical attention (report to your doctor or health care professional if they continue or are bothersome):  changes in taste  constipation  dizziness  headache  joint pain  muscle pain  trouble sleeping  unusually weak or tired This list may not describe all possible side effects. Call your doctor for medical advice about side effects. You may report side effects to FDA at 1-800-FDA-1088. Where should I keep my medicine? This drug is given in a hospital or clinic and will not be stored at home. NOTE: This sheet is a summary. It may not cover all possible information. If you have questions about this medicine, talk to your doctor, pharmacist, or health care provider.  2021 Elsevier/Gold Standard (2017-08-30 10:03:15)

## 2020-08-10 ENCOUNTER — Encounter: Payer: Self-pay | Admitting: Radiation Oncology

## 2020-08-15 ENCOUNTER — Ambulatory Visit: Payer: BC Managed Care – PPO | Attending: Surgery | Admitting: Physical Therapy

## 2020-08-15 ENCOUNTER — Other Ambulatory Visit: Payer: Self-pay

## 2020-08-15 DIAGNOSIS — Z483 Aftercare following surgery for neoplasm: Secondary | ICD-10-CM | POA: Insufficient documentation

## 2020-08-15 NOTE — Therapy (Signed)
[place], [place], [address] Phone: [phone]   Fax:  [phone]  Physical Therapy Treatment  Patient Details  Name: Michelle Anthony MRN: [id number] Date of Birth: 1966/02/10 Referring Provider (PT): Dr. Erroll Luna   Encounter Date: 08/15/2020   PT End of Session - 08/15/20 1218    Visit Number 2    Number of Visits 2    PT Start Time 1017    PT Stop Time 1212    PT Time Calculation (min) 17 min           Past Medical History:  Diagnosis Date  . Erythema nodosum   . Family history of breast cancer   . Family history of melanoma   . Frequent UTI   . Frozen shoulder   . History of radiation therapy 06/23/20-07/22/20   Right Breast, Dr. Gery Pray   . STD (sexually transmitted disease)    H/O HSV II    Past Surgical History:  Procedure Laterality Date  . BOTOX INJECTION    . BREAST LUMPECTOMY WITH RADIOACTIVE SEED AND SENTINEL LYMPH NODE BIOPSY Right 05/17/2020   Procedure: RIGHT BREAST LUMPECTOMY WITH RADIOACTIVE SEED x5 AND SENTINEL LYMPH NODE MAPPING;  Surgeon: Erroll Luna, MD;  Location: [facility];  Service: General;  Laterality: Right;  . CLOSED MANIPULATION SHOULDER    . PORTACATH PLACEMENT Right 01/05/2020   Procedure: INSERTION PORT-A-CATH WITH ULTRASOUND GUIDANCE;  Surgeon: Erroll Luna, MD;  Location: [facility];  Service: General;  Laterality: Right;    There were no vitals filed for this visit.   Subjective Assessment - 08/15/20 1216    Subjective Pt here for SOZO screen                  L-DEX FLOWSHEETS - 08/15/20 1200      L-DEX LYMPHEDEMA SCREENING   Measurement Type Unilateral    L-DEX MEASUREMENT EXTREMITY Upper Extremity    POSITION  Standing    DOMINANT SIDE Right    At Risk Side Right    BASELINE SCORE (UNILATERAL) -1.7    L-DEX SCORE (UNILATERAL) -0.7    VALUE CHANGE (UNILAT) 1                                   PT Long Term Goals - 06/13/20 1200      PT LONG TERM GOAL #1   Title Patient will demonstrate she has regained full shoulder ROM and function post operatively compared to baselines.    Time 6    Period Months    Status Achieved                 Plan - 08/15/20 1218    Clinical Impression Statement Pt measured within 1 point of baseline SOZO so no early sign of lymphedema detected.    PT Next Visit Plan SOZO every 3 months    Consulted and Agree with Plan of Care Patient           Patient will benefit from skilled therapeutic intervention in order to improve the following deficits and impairments:     Visit Diagnosis: Aftercare following surgery for neoplasm     Problem List Patient Active Problem List   Diagnosis Date Noted  . Port-A-Cath in place 01/13/2020  . Genetic testing 01/01/2020  . Family history of breast cancer   . Family history  of melanoma   . Malignant neoplasm of upper-outer quadrant of right breast in female, estrogen receptor positive ([place]) 12/16/2019  . Arthritis of carpometacarpal (CMC) joint of thumb 11/01/2017  . Fatigue 11/01/2017   Annia Friendly, PT 08/15/20 12:19 PM  [place], [place], [address] Phone: [phone]   Fax:  [phone]  Name: Michelle Anthony MRN: [id number] Date of Birth: [birth year]

## 2020-08-16 ENCOUNTER — Encounter: Payer: Self-pay | Admitting: Radiology

## 2020-08-21 NOTE — Progress Notes (Signed)
Radiation Oncology         [phone] ________________________________  Name: Michelle Anthony MRN: [id number]  Date: 08/22/2020  DOB: [birth year]  Follow-Up Visit Note  CC: Michelle Mires, MD  Michelle Mires, MD    ICD-10-CM   1. Malignant neoplasm of upper-outer quadrant of right breast in female, estrogen receptor positive ([place])  C50.411    Z17.0     Diagnosis: StageympT1b,ypN0,RightBreastUOQ,Invasive LobularCarcinoma with LCIS and ALH, ER+/ PR+/ Her2+, Grade2  Interval Since Last Radiation: One month and one day  Radiation Treatment Dates: 06/23/2020 through 07/22/2020  Site: Right breast Technique: 3D Total Dose (Gy): 40.05/40.05 Dose per Fx (Gy): 2.67 Completed Fx: 15/15 Beam Energies: 6X  Site: Right breast boost Technique: specialPort Total Dose (Gy): 12/12 Dose per Fx (Gy): 2 Completed Fx: 6/6 Beam Energies: 9E, 12E  Narrative:  The patient returns today for routine follow-up. No significant interval history since the end of treatment with the exception of post-surgery physical therapy.  On review of systems, she reports ongoing fatigue.  She is on short-term disability in light of this issue.. She denies nipple discharge or bleeding.  She reports some mild tenderness in the right breast and some numbness in the axillary area.            ALLERGIES:  has No Known Allergies.  Meds: Current Outpatient Medications  Medication Sig Dispense Refill  . COLLAGEN PO Apply topically daily.    [name] gabapentin (NEURONTIN) 300 MG capsule Take 1 capsule (300 mg total) by mouth at bedtime. 90 capsule 3  . ibuprofen (ADVIL) 200 MG tablet Take 3 tablets (600 mg total) by mouth every 8 (eight) hours as needed.    . Multiple Vitamin (MULTIVITAMIN) tablet Take 1 tablet by mouth daily.    . Tretinoin (RETIN-A EX) Apply topically.     No current facility-administered medications for this encounter.    Physical Findings: The patient is in no acute distress. Patient is  alert and oriented.  height is $RemoveB'5\' 5"'WUtJgwbH$  (1.651 m) and weight is 141 lb 12.8 oz (64.3 kg). Her temperature is 98.2 F (36.8 C). Her blood pressure is 106/75 and her pulse is 71. Her respiration is 18 and oxygen saturation is 99%.   Lungs are clear to auscultation bilaterally. Heart has regular rate and rhythm. No palpable cervical, supraclavicular, or axillary adenopathy. Abdomen soft, non-tender, normal bowel sounds. Left breast: No palpable mass, nipple discharge, or bleeding. Right breast: Skin is healed well.  Mild hyperpigmentation changes.  Minimal edema in the breast.  No dominant mass appreciated breast nipple discharge or bleeding.  Lab Findings: Lab Results  Component Value Date   WBC 5.2 08/03/2020   HGB 13.1 08/03/2020   HCT 38.2 08/03/2020   MCV 92.3 08/03/2020   PLT 179 08/03/2020    Radiographic Findings: No results found.  Impression: StageympT1b,ypN0,RightBreastUOQ,Invasive LobularCarcinoma with LCIS and ALH, ER+/ PR+/ Her2+, Grade2  The patient is recovering from the effects of radiation.  She continues to have some fatigue as above but no other issues.  Plan: The patient is scheduled to follow up with Dr. Lindi Anthony for her next cycle of Kadcyla on 08/25/2020. She will follow up with radiation oncology in as needed basis in light of her close follow-up with medical oncology.   ____________________________________   Michelle Promise, PhD, MD  This document serves as a record of services personally performed by Gery Pray, MD. It was created on his behalf by Clerance Lav, a trained medical  scribe. The creation of this record is based on the scribe's personal observations and the provider's statements to them. This document has been checked and approved by the attending provider.

## 2020-08-21 NOTE — Progress Notes (Incomplete)
  Patient Name: Michelle Anthony MRN: [id number] DOB: Dec 10, 1965 Referring Physician: Suzanna Obey (Profile Not Attached) Date of Service: 07/22/2020 [facility], [place]                                                        End Of Treatment Note  Diagnoses: C50.411-Malignant neoplasm of upper-outer quadrant of right female breast   Cancer Staging: Stage ympT1b, ypN0,RightBreastUOQ,Invasive LobularCarcinoma with LCIS and ALH, ER+/ PR+/ Her2+, Grade2  Intent: Curative  Radiation Treatment Dates: 06/23/2020 through 07/22/2020  Site: Right breast Technique: 3D Total Dose (Gy): 40.05/40.05 Dose per Fx (Gy): 2.67 Completed Fx: 15/15 Beam Energies: 6X  Site: Right breast boost Technique: specialPort Total Dose (Gy): 12/12 Dose per Fx (Gy): 2 Completed Fx: 6/6 Beam Energies: 9E, 12E  Narrative: The patient tolerated radiation therapy relatively well. She did report a mild rash on the mid sternum, some burning to the right breast, and increased fatigue. She denied right arm swelling. Towards the end of treatment, the right breast area showed diffuse hyperpigmentation changes without moist desquamation. There was also noted to be a small area near the axillary scar that was slightly opened and measured 5 mm.  Plan: The patient will follow-up with radiation oncology in one month.  ________________________________________________   Blair Promise, PhD, MD  This document serves as a record of services personally performed by Gery Pray, MD. It was created on his behalf by Clerance Lav, a trained medical scribe. The creation of this record is based on the scribe's personal observations and the provider's statements to them. This document has been checked and approved by the attending provider.

## 2020-08-22 ENCOUNTER — Ambulatory Visit
Admission: RE | Admit: 2020-08-22 | Discharge: 2020-08-22 | Disposition: A | Discharge status: Home / Self Care | Payer: BC Managed Care – PPO | Source: Ambulatory Visit | Attending: Radiation Oncology | Admitting: Radiation Oncology

## 2020-08-22 ENCOUNTER — Other Ambulatory Visit: Payer: Self-pay

## 2020-08-22 ENCOUNTER — Encounter: Payer: Self-pay | Admitting: Radiation Oncology

## 2020-08-22 VITALS — BP 106/75 | HR 71 | Temp 98.2°F | Resp 18 | Ht 65.0 in | Wt 141.8 lb

## 2020-08-22 DIAGNOSIS — Z170 Estrogen receptor positive status [ER+]: Secondary | ICD-10-CM

## 2020-08-22 DIAGNOSIS — R5383 Other fatigue: Secondary | ICD-10-CM | POA: Diagnosis not present

## 2020-08-22 DIAGNOSIS — Z923 Personal history of irradiation: Secondary | ICD-10-CM | POA: Insufficient documentation

## 2020-08-22 DIAGNOSIS — C50411 Malignant neoplasm of upper-outer quadrant of right female breast: Secondary | ICD-10-CM | POA: Insufficient documentation

## 2020-08-22 HISTORY — DX: Personal history of irradiation: Z92.3

## 2020-08-22 NOTE — Progress Notes (Signed)
Michelle Anthony is here today for follow up post radiation to the breast.   Breast Side:Right   They completed their radiation on: 07/22/20  Does the patient complain of any of the following: . Post radiation skin issues:  Patient states her skin has greatly improved.  . Breast Tenderness: Patient reports mild tenderness, patient reports having numbness under right axilla.  . Breast Swelling:Denies swelling . Lymphadema: no . Range of Motion limitations: Full  . Fatigue post radiation: Patient reports energy level is improving.  [name] Appetite good/fair/poor: Good  Additional comments if applicable:  Vitals:   88/91/69 1027  BP: 106/75  Pulse: 71  Resp: 18  Temp: 98.2 F (36.8 C)  SpO2: 99%  Weight: 141 lb 12.8 oz (64.3 kg)  Height: 5\' 5"  (1.651 m)

## 2020-08-24 ENCOUNTER — Encounter: Payer: Self-pay | Admitting: *Deleted

## 2020-08-24 NOTE — Progress Notes (Signed)
Patient Care Team: Katherina Mires, MD as PCP - General (Family Medicine) Rockwell Germany, RN as Oncology Nurse Navigator Mauro Kaufmann, RN as Oncology Nurse Navigator Erroll Luna, MD as Consulting Physician (General Surgery) Nicholas Lose, MD as Consulting Physician (Hematology and Oncology) Gery Pray, MD as Consulting Physician (Radiation Oncology)  DIAGNOSIS:    ICD-10-CM   1. Malignant neoplasm of upper-outer quadrant of right breast in female, estrogen receptor positive ([place])  C50.411    Z17.0     SUMMARY OF ONCOLOGIC HISTORY: Oncology History  Malignant neoplasm of upper-outer quadrant of right breast in female, estrogen receptor positive ([place])  12/16/2019 Initial Diagnosis   Screening mammogram  showed a right breast asymmetry. Diagnostic mammogram showed a 2.9cm mass at the 9 o'clock position with 2 adjacent masses at the 10 and 8 o'clock positions, 0.9cm and 1.4cm respectively, and an enlarged lymph node, 1.3cm.  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 The report date is 12/31/2019.   The Multi-Cancer Panel offered by [facility] includes sequencing and/or deletion duplication testing of the following 85 genes: AIP, ALK, APC, ATM, AXIN2,BAP1,  BARD1, BLM, BMPR1A, BRCA1, BRCA2, BRIP1, CASR, CDC73, CDH1, CDK4, CDKN1B, CDKN1C, CDKN2A (p14ARF), CDKN2A (p16INK4a), CEBPA, CHEK2, CTNNA1, DICER1, DIS3L2, EGFR (c.2369C>T, p.Thr790Met variant only), EPCAM (Deletion/duplication testing only), FH, FLCN, GATA2, GPC3, GREM1 (Promoter region deletion/duplication testing only), HOXB13 (c.251G>A, p.Gly84Glu), HRAS, KIT, MAX, MEN1, MET, MITF (c.952G>A, p.Glu318Lys variant only), MLH1, MSH2, MSH3, MSH6, MUTYH, NBN, NF1,  NF2, NTHL1, PALB2, PDGFRA, PHOX2B, PMS2, POLD1, POLE, POT1, PRKAR1A, PTCH1, PTEN, RAD50, RAD51C, RAD51D, RB1, RECQL4, RET, RNF43, RUNX1, SDHAF2, SDHA (sequence changes only), SDHB, SDHC, SDHD, SMAD4, SMARCA4, SMARCB1, SMARCE1, STK11, SUFU, TERC, TERT, TMEM127, TP53, TSC1, TSC2, VHL, WRN and WT1.   01/06/2020 - 04/22/2020 Chemotherapy   Neoadjuvant chemo with TCH Perjeta x6 cycles       05/17/2020 Surgery   Right lumpectomy ([facility]): scattered foci of residual invasive lobular carcinoma, 0.6-1.0cm, grade 2, clear margins, 2 right axillary lymph nodes negative for carcinoma.   06/02/2020 -  Chemotherapy    Patient is on Treatment Plan: BREAST ADO-TRASTUZUMAB EMTANSINE (KADCYLA) Q21D        CHIEF COMPLIANT: Kadcylamaintenance  INTERVAL HISTORY: Michelle Anthony is a 55 y.o. with above-mentioned history of right breast cancertreated with neoadjuvantchemotherapy, lumpectomy,and iscurrently onKadcylamaintenance. Echo on 07/22/20 showed an ejection fraction of 55-60%. She presents to the clinictodayfor treatment. Fatigue and peripheral neuropathy of her symptoms.  She is having a hard time at work.  ALLERGIES:  has No Known Allergies.  MEDICATIONS:  Current Outpatient Medications  Medication Sig Dispense Refill  . COLLAGEN PO Apply topically daily.    [name] gabapentin (NEURONTIN) 300 MG capsule Take 1 capsule (300 mg total) by mouth at bedtime. 90 capsule 3  . ibuprofen (ADVIL) 200 MG tablet Take 3 tablets (600 mg total) by mouth every 8 (eight) hours as needed.    . Multiple Vitamin (MULTIVITAMIN) tablet Take 1 tablet by mouth daily.    . Tretinoin (RETIN-A EX) Apply topically.     No current facility-administered medications for this visit.    PHYSICAL EXAMINATION: ECOG PERFORMANCE STATUS: 1 - Symptomatic but completely ambulatory  Vitals:   08/25/20 1017  BP:  116/62  Pulse: 73  Resp: 16  Temp: 97.7 F (36.5 C)  SpO2: 100%   Filed Weights   08/25/20 1017  Weight: 142 lb  4.8 oz (64.5 kg)    LABORATORY DATA:  I have reviewed the data as listed CMP Latest Ref Rng & Units 08/25/2020 08/03/2020 07/13/2020  Glucose 70 - 99 mg/dL 111(H) 100(H) 94  BUN 6 - 20 mg/dL 19 18 16   Creatinine 0.44 - 1.00 mg/dL 0.71 0.78 0.68  Sodium 135 - 145 mmol/L 143 143 143  Potassium 3.5 - 5.1 mmol/L 3.5 3.8 3.6  Chloride 98 - 111 mmol/L 105 104 105  CO2 22 - 32 mmol/L 27 29 26   Calcium 8.9 - 10.3 mg/dL 9.6 9.7 9.4  Total Protein 6.5 - 8.1 g/dL 7.3 7.3 7.3  Total Bilirubin 0.3 - 1.2 mg/dL 0.5 0.3 0.4  Alkaline Phos 38 - 126 U/L 71 68 62  AST 15 - 41 U/L 25 27 28   ALT 0 - 44 U/L 29 35 35    Lab Results  Component Value Date   WBC 5.2 08/25/2020   HGB 12.6 08/25/2020   HCT 38.4 08/25/2020   MCV 93.0 08/25/2020   PLT 236 08/25/2020   NEUTROABS 3.0 08/25/2020    ASSESSMENT & PLAN:  Malignant neoplasm of upper-outer quadrant of right breast in female, estrogen receptor positive ([place]) /25/2021:Screening mammogram showed a right breast asymmetry. Diagnostic mammogram showed a 2.9cm mass at the 9 o'clock position with 2 adjacent masses at the 10 and 8 o'clock positions, 0.9cm and 1.4cm respectively, and an enlarged lymph node, 1.3cm. 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 Neoadjuvant chemotherapy with TCH Perjeta 6 cycles followed by Herceptinand Perjeta versus Kadcylamaintenance for 1 year(also could be randomized compass HER 2 trial) 2. Followed by breast conserving surgery if possible with sentinel lymph node study 3. Followed by adjuvant radiation therapy if patient had lumpectomy 4.Followed by adjuvant antiestrogen therapy SWOG S 1714 neuropathy clinical trial: No adverse effects from participating in the trial 12/31/2019: Breast MRI: Known malignancy 3.1 cm, with non-mass enhancement total measuring 4.2 cm. Additional  masses 6 mm and 3 and 4 mm MRI guided biopsy on the 2 additional masses:Invasive lobular cancer with LCIS, retroareolar: LCIS grade 2 --------------------------------------------------------------------------------------------------------------------------------------------- 05/17/20:Right lumpectomy ([facility]): scattered foci of residual invasive lobular carcinoma, 0.6-1.0cm, grade 2, clear margins, 2 right axillary lymph nodes negative for carcinoma.  Current treatment: Kadcyla Maintenance, today is cycle5 (until September 2022) 1.  Fatigue 2. based on how fatigued she is and the neuropathy symptoms I recommended short-term disability for her.  Peripheral neuropathy: Numbness of the tips of her toes: She is forgetting to take gabapentin at bedtime.  Return to clinic in 3weeksfor cycle6    No orders of the defined types were placed in this encounter.  The patient has a good understanding of the overall plan. she agrees with it. she will call with any problems that may develop before the next visit here.  Total time spent: 30 mins including face to face time and time spent for planning, charting and coordination of care  Rulon Eisenmenger, MD, MPH 08/25/2020  I, Molly Dorshimer, am acting as scribe for Dr. Nicholas Lose.  I have reviewed the above documentation for accuracy and completeness, and I agree with the above.

## 2020-08-25 ENCOUNTER — Other Ambulatory Visit: Payer: BC Managed Care – PPO

## 2020-08-25 ENCOUNTER — Inpatient Hospital Stay (HOSPITAL_BASED_OUTPATIENT_CLINIC_OR_DEPARTMENT_OTHER): Payer: BC Managed Care – PPO | Admitting: Hematology and Oncology

## 2020-08-25 ENCOUNTER — Other Ambulatory Visit: Payer: Self-pay

## 2020-08-25 ENCOUNTER — Inpatient Hospital Stay: Payer: BC Managed Care – PPO

## 2020-08-25 ENCOUNTER — Inpatient Hospital Stay: Payer: BC Managed Care – PPO | Attending: Hematology and Oncology

## 2020-08-25 DIAGNOSIS — R5383 Other fatigue: Secondary | ICD-10-CM | POA: Diagnosis not present

## 2020-08-25 DIAGNOSIS — Z9221 Personal history of antineoplastic chemotherapy: Secondary | ICD-10-CM | POA: Insufficient documentation

## 2020-08-25 DIAGNOSIS — C50411 Malignant neoplasm of upper-outer quadrant of right female breast: Secondary | ICD-10-CM | POA: Insufficient documentation

## 2020-08-25 DIAGNOSIS — Z5112 Encounter for antineoplastic immunotherapy: Secondary | ICD-10-CM | POA: Insufficient documentation

## 2020-08-25 DIAGNOSIS — Z95828 Presence of other vascular implants and grafts: Secondary | ICD-10-CM

## 2020-08-25 DIAGNOSIS — Z170 Estrogen receptor positive status [ER+]: Secondary | ICD-10-CM | POA: Insufficient documentation

## 2020-08-25 DIAGNOSIS — G629 Polyneuropathy, unspecified: Secondary | ICD-10-CM | POA: Insufficient documentation

## 2020-08-25 LAB — CBC WITH DIFFERENTIAL/PLATELET
Abs Immature Granulocytes: 0 10*3/uL (ref 0.00–0.07)
Basophils Absolute: 0 10*3/uL (ref 0.0–0.1)
Basophils Relative: 1 %
Eosinophils Absolute: 0.2 10*3/uL (ref 0.0–0.5)
Eosinophils Relative: 3 %
HCT: 38.4 % (ref 36.0–46.0)
Hemoglobin: 12.6 g/dL (ref 12.0–15.0)
Immature Granulocytes: 0 %
Lymphocytes Relative: 31 %
Lymphs Abs: 1.6 10*3/uL (ref 0.7–4.0)
MCH: 30.5 pg (ref 26.0–34.0)
MCHC: 32.8 g/dL (ref 30.0–36.0)
MCV: 93 fL (ref 80.0–100.0)
Monocytes Absolute: 0.4 10*3/uL (ref 0.1–1.0)
Monocytes Relative: 8 %
Neutro Abs: 3 10*3/uL (ref 1.7–7.7)
Neutrophils Relative %: 57 %
Platelets: 236 10*3/uL (ref 150–400)
RBC: 4.13 MIL/uL (ref 3.87–5.11)
RDW: 12.8 % (ref 11.5–15.5)
WBC: 5.2 10*3/uL (ref 4.0–10.5)
nRBC: 0 % (ref 0.0–0.2)

## 2020-08-25 LAB — COMPREHENSIVE METABOLIC PANEL
ALT: 29 U/L (ref 0–44)
AST: 25 U/L (ref 15–41)
Albumin: 4 g/dL (ref 3.5–5.0)
Alkaline Phosphatase: 71 U/L (ref 38–126)
Anion gap: 11 (ref 5–15)
BUN: 19 mg/dL (ref 6–20)
CO2: 27 mmol/L (ref 22–32)
Calcium: 9.6 mg/dL (ref 8.9–10.3)
Chloride: 105 mmol/L (ref 98–111)
Creatinine, Ser: 0.71 mg/dL (ref 0.44–1.00)
GFR, Estimated: >60 mL/min (ref >60)
Glucose, Bld: 111 mg/dL — ABNORMAL HIGH (ref 70–99)
Potassium: 3.5 mmol/L (ref 3.5–5.1)
Sodium: 143 mmol/L (ref 135–145)
Total Bilirubin: 0.5 mg/dL (ref 0.3–1.2)
Total Protein: 7.3 g/dL (ref 6.5–8.1)

## 2020-08-25 MED ORDER — DIPHENHYDRAMINE HCL 25 MG PO CAPS
ORAL_CAPSULE | ORAL | Status: AC
  Filled 2020-08-25: qty 2

## 2020-08-25 MED ORDER — SODIUM CHLORIDE 0.9 % IV SOLN
Freq: Once | INTRAVENOUS | Status: AC
  Filled 2020-08-25: qty 250

## 2020-08-25 MED ORDER — SODIUM CHLORIDE 0.9% FLUSH
10.0000 mL | Freq: Once | INTRAVENOUS | Status: AC
  Administered 2020-08-25: 10 mL
  Filled 2020-08-25: qty 10

## 2020-08-25 MED ORDER — ACETAMINOPHEN 325 MG PO TABS
650.0000 mg | ORAL_TABLET | Freq: Once | ORAL | Status: AC
  Administered 2020-08-25: 650 mg via ORAL

## 2020-08-25 MED ORDER — HEPARIN SOD (PORK) LOCK FLUSH 100 UNIT/ML IV SOLN
500.0000 [IU] | Freq: Once | INTRAVENOUS | Status: AC | PRN
  Administered 2020-08-25: 500 [IU]
  Filled 2020-08-25: qty 5

## 2020-08-25 MED ORDER — DIPHENHYDRAMINE HCL 25 MG PO CAPS
50.0000 mg | ORAL_CAPSULE | Freq: Once | ORAL | Status: AC
  Administered 2020-08-25: 50 mg via ORAL

## 2020-08-25 MED ORDER — SODIUM CHLORIDE 0.9 % IV SOLN
3.6000 mg/kg | Freq: Once | INTRAVENOUS | Status: AC
  Administered 2020-08-25: 240 mg via INTRAVENOUS
  Filled 2020-08-25: qty 8

## 2020-08-25 MED ORDER — SODIUM CHLORIDE 0.9% FLUSH
10.0000 mL | INTRAVENOUS | Status: DC | PRN
  Administered 2020-08-25: 10 mL
  Filled 2020-08-25: qty 10

## 2020-08-25 MED ORDER — ACETAMINOPHEN 325 MG PO TABS
ORAL_TABLET | ORAL | Status: AC
  Filled 2020-08-25: qty 2

## 2020-08-25 NOTE — Patient Instructions (Signed)
[facility] MEDICAL [facility]COL[facility]  Discharge Instructions: °Thank you for choosing [facility] to provide your oncology and hematology care.  ° °If you have a lab appointment with the Cancer Center, please go directly to the Cancer Center and check in at the registration area. °  °Wear comfortable clothing and clothing appropriate for easy access to any Portacath or PICC line.  ° °We strive to give you quality time with your provider. You may need to reschedule your appointment if you arrive late (15 or more minutes).  Arriving late affects you and other patients whose appointments are after yours.  Also, if you miss three or more appointments without notifying the office, you may be dismissed from the clinic at the provider’s discretion.    °  °For prescription refill requests, have your pharmacy contact our office and allow 72 hours for refills to be completed.   ° °Today you received the following chemotherapy and/or immunotherapy agents Kadcyla    °  °To help prevent nausea and vomiting after your treatment, we encourage you to take your nausea medication as directed. ° °BELOW ARE SYMPTOMS THAT SHOULD BE REPORTED IMMEDIATELY: °*FEVER GREATER THAN 100.4 F (38 °C) OR HIGHER °*CHILLS OR SWEATING °*NAUSEA AND VOMITING THAT IS NOT CONTROLLED WITH YOUR NAUSEA MEDICATION °*UNUSUAL SHORTNESS OF BREATH °*UNUSUAL BRUISING OR BLEEDING °*URINARY PROBLEMS (pain or burning when urinating, or frequent urination) °*BOWEL PROBLEMS (unusual diarrhea, constipation, pain near the anus) °TENDERNESS IN MOUTH AND THROAT WITH OR WITHOUT PRESENCE OF ULCERS (sore throat, sores in mouth, or a toothache) °UNUSUAL RASH, SWELLING OR PAIN  °UNUSUAL VAGINAL DISCHARGE OR ITCHING  ° °Items with * indicate a potential emergency and should be followed up as soon as possible or go to the Emergency Department if any problems should occur. ° °Please show the CHEMOTHERAPY ALERT CARD or IMMUNOTHERAPY ALERT CARD at check-in to the  Emergency Department and triage nurse. ° °Should you have questions after your visit or need to cancel or reschedule your appointment, please contact [facility]  Dept: [phone]  and follow the prompts.  Office hours are 8:00 a.m. to 4:30 p.m. Monday - Friday. Please note that voicemails left after 4:00 p.m. may not be returned until the following business day.  We are closed weekends and major holidays. You have access to a nurse at all times for urgent questions. Please call the main number to the clinic Dept: [phone] and follow the prompts. ° ° °For any non-urgent questions, you may also contact your provider using MyChart. We now offer e-Visits for anyone 18 and older to request care online for non-urgent symptoms. For details visit [email or web address]. °  °Also download the MyChart app! Go to the app store, search "MyChart", open the app, select [facility], and log in with your MyChart username and password. ° °Due to Covid, a mask is required upon entering the hospital/clinic. If you do not have a mask, one will be given to you upon arrival. For doctor visits, patients may have 1 support person aged 18 or older with them. For treatment visits, patients cannot have anyone with them due to current Covid guidelines and our immunocompromised population.  ° °

## 2020-08-25 NOTE — Assessment & Plan Note (Signed)
/  25/2021:Screening mammogram showed a right breast asymmetry. Diagnostic mammogram showed a 2.9cm mass at the 9 o'clock position with 2 adjacent masses at the 10 and 8 o'clock positions, 0.9cm and 1.4cm respectively, and an enlarged lymph node, 1.3cm. 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 Neoadjuvant chemotherapy with TCH Perjeta 6 cycles followed by Herceptinand Perjeta versus Kadcylamaintenance for 1 year(also could be randomized compass HER 2 trial) 2. Followed by breast conserving surgery if possible with sentinel lymph node study 3. Followed by adjuvant radiation therapy if patient had lumpectomy 4.Followed by adjuvant antiestrogen therapy SWOG S 1714 neuropathy clinical trial: No adverse effects from participating in the trial 12/31/2019: Breast MRI: Known malignancy 3.1 cm, with non-mass enhancement total measuring 4.2 cm. Additional masses 6 mm and 3 and 4 mm MRI guided biopsy on the 2 additional masses:Invasive lobular cancer with LCIS, retroareolar: LCIS grade 2 --------------------------------------------------------------------------------------------------------------------------------------------- 05/17/20:Right lumpectomy ([facility]): scattered foci of residual invasive lobular carcinoma, 0.6-1.0cm, grade 2, clear margins, 2 right axillary lymph nodes negative for carcinoma.  Current treatment: Kadcyla Maintenance, today is cycle5 (until September 2022) 1.  Fatigue  Stressful workplace  Peripheral neuropathy: Numbness of the tips of her toes: Grade 1-2, because she has not noticed any improvement, we will increase the dosage of gabapentin to 300 mg at bedtime  Return to clinic in 3weeksfor cycle6

## 2020-08-29 ENCOUNTER — Telehealth: Payer: Self-pay | Admitting: Hematology and Oncology

## 2020-08-29 NOTE — Telephone Encounter (Signed)
Sch per 5/12 los, Pt aware

## 2020-08-30 DIAGNOSIS — Z1211 Encounter for screening for malignant neoplasm of colon: Secondary | ICD-10-CM | POA: Insufficient documentation

## 2020-08-30 DIAGNOSIS — Z800 Family history of malignant neoplasm of digestive organs: Secondary | ICD-10-CM | POA: Insufficient documentation

## 2020-09-01 ENCOUNTER — Encounter: Payer: Self-pay | Admitting: *Deleted

## 2020-09-01 NOTE — Progress Notes (Signed)
RN successfully faxed completed and signed FMLA/disability paperwork to [facility] [phone].

## 2020-09-14 NOTE — Progress Notes (Signed)
Patient Care Team: Katherina Mires, MD as PCP - General (Family Medicine) Rockwell Germany, RN as Oncology Nurse Navigator Mauro Kaufmann, RN as Oncology Nurse Navigator Erroll Luna, MD as Consulting Physician (General Surgery) Nicholas Lose, MD as Consulting Physician (Hematology and Oncology) Gery Pray, MD as Consulting Physician (Radiation Oncology)  DIAGNOSIS:    ICD-10-CM   1. Malignant neoplasm of upper-outer quadrant of right breast in female, estrogen receptor positive ([place])  C50.411    Z17.0     SUMMARY OF ONCOLOGIC HISTORY: Oncology History  Malignant neoplasm of upper-outer quadrant of right breast in female, estrogen receptor positive ([place])  12/16/2019 Initial Diagnosis   Screening mammogram  showed a right breast asymmetry. Diagnostic mammogram showed a 2.9cm mass at the 9 o'clock position with 2 adjacent masses at the 10 and 8 o'clock positions, 0.9cm and 1.4cm respectively, and an enlarged lymph node, 1.3cm.  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 The report date is 12/31/2019.   The Multi-Cancer Panel offered by [facility] includes sequencing and/or deletion duplication testing of the following 85 genes: AIP, ALK, APC, ATM, AXIN2,BAP1,  BARD1, BLM, BMPR1A, BRCA1, BRCA2, BRIP1, CASR, CDC73, CDH1, CDK4, CDKN1B, CDKN1C, CDKN2A (p14ARF), CDKN2A (p16INK4a), CEBPA, CHEK2, CTNNA1, DICER1, DIS3L2, EGFR (c.2369C>T, p.Thr790Met variant only), EPCAM (Deletion/duplication testing only), FH, FLCN, GATA2, GPC3, GREM1 (Promoter region deletion/duplication testing only), HOXB13 (c.251G>A, p.Gly84Glu), HRAS, KIT, MAX, MEN1, MET, MITF (c.952G>A, p.Glu318Lys variant only), MLH1, MSH2, MSH3, MSH6, MUTYH, NBN, NF1,  NF2, NTHL1, PALB2, PDGFRA, PHOX2B, PMS2, POLD1, POLE, POT1, PRKAR1A, PTCH1, PTEN, RAD50, RAD51C, RAD51D, RB1, RECQL4, RET, RNF43, RUNX1, SDHAF2, SDHA (sequence changes only), SDHB, SDHC, SDHD, SMAD4, SMARCA4, SMARCB1, SMARCE1, STK11, SUFU, TERC, TERT, TMEM127, TP53, TSC1, TSC2, VHL, WRN and WT1.   01/06/2020 - 04/22/2020 Chemotherapy   Neoadjuvant chemo with TCH Perjeta x6 cycles       05/17/2020 Surgery   Right lumpectomy ([facility]): scattered foci of residual invasive lobular carcinoma, 0.6-1.0cm, grade 2, clear margins, 2 right axillary lymph nodes negative for carcinoma.   06/02/2020 -  Chemotherapy    Patient is on Treatment Plan: BREAST ADO-TRASTUZUMAB EMTANSINE (KADCYLA) Q21D        CHIEF COMPLIANT: Kadcylamaintenance  INTERVAL HISTORY: Michelle Anthony is a 55 y.o. with above-mentioned history of right breast cancertreated with neoadjuvantchemotherapy, lumpectomy,and iscurrently onKadcylamaintenance. She presents to the clinictodayfor treatment. She has mild to moderate peripheral neuropathy symptoms.  She tells me that the gabapentin is not doing much.  Denies any major trouble with Kadcyla.  Denies any nausea or vomiting.  She does feel mildly fatigued.  She is excited that her hair is growing back.  ALLERGIES:  has No Known Allergies.  MEDICATIONS:  Current Outpatient Medications  Medication Sig Dispense Refill  . COLLAGEN PO Apply topically daily.    [name] gabapentin (NEURONTIN) 300 MG capsule Take 1 capsule (300 mg total) by mouth at bedtime. 90 capsule 3  . ibuprofen (ADVIL) 200 MG tablet Take 3 tablets (600 mg total) by mouth every 8 (eight) hours as needed.    . Multiple Vitamin (MULTIVITAMIN) tablet Take 1 tablet by mouth daily.    . Tretinoin (RETIN-A EX) Apply topically.     No current facility-administered medications for this  visit.    PHYSICAL EXAMINATION: ECOG PERFORMANCE STATUS: 1 - Symptomatic but completely ambulatory  Vitals:   09/15/20 1000  BP:  107/68  Pulse: (!) 109  Resp: 17  Temp: 97.7 F (36.5 C)  SpO2: 100%   Filed Weights   09/15/20 1000  Weight: 142 lb (64.4 kg)    LABORATORY DATA:  I have reviewed the data as listed CMP Latest Ref Rng & Units 08/25/2020 08/03/2020 07/13/2020  Glucose 70 - 99 mg/dL 111(H) 100(H) 94  BUN 6 - 20 mg/dL 19 18 16   Creatinine 0.44 - 1.00 mg/dL 0.71 0.78 0.68  Sodium 135 - 145 mmol/L 143 143 143  Potassium 3.5 - 5.1 mmol/L 3.5 3.8 3.6  Chloride 98 - 111 mmol/L 105 104 105  CO2 22 - 32 mmol/L 27 29 26   Calcium 8.9 - 10.3 mg/dL 9.6 9.7 9.4  Total Protein 6.5 - 8.1 g/dL 7.3 7.3 7.3  Total Bilirubin 0.3 - 1.2 mg/dL 0.5 0.3 0.4  Alkaline Phos 38 - 126 U/L 71 68 62  AST 15 - 41 U/L 25 27 28   ALT 0 - 44 U/L 29 35 35    Lab Results  Component Value Date   WBC 5.3 09/15/2020   HGB 13.7 09/15/2020   HCT 40.0 09/15/2020   MCV 91.1 09/15/2020   PLT 266 09/15/2020   NEUTROABS 2.8 09/15/2020    ASSESSMENT & PLAN:  Malignant neoplasm of upper-outer quadrant of right breast in female, estrogen receptor positive ([place]) /25/2021:Screening mammogram showed a right breast asymmetry. Diagnostic mammogram showed a 2.9cm mass at the 9 o'clock position with 2 adjacent masses at the 10 and 8 o'clock positions, 0.9cm and 1.4cm respectively, and an enlarged lymph node, 1.3cm. 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 Neoadjuvant chemotherapy with TCH Perjeta 6 cycles followed by Herceptinand Perjeta versus Kadcylamaintenance for 1 year(also could be randomized compass HER 2 trial) 2. Followed by breast conserving surgery if possible with sentinel lymph node study 3. Followed by adjuvant radiation therapy if patient had lumpectomy 4.Followed by adjuvant antiestrogen therapy SWOG S 1714 neuropathy clinical trial: No adverse effects from participating in  the trial 12/31/2019: Breast MRI: Known malignancy 3.1 cm, with non-mass enhancement total measuring 4.2 cm. Additional masses 6 mm and 3 and 4 mm MRI guided biopsy on the 2 additional masses:Invasive lobular cancer with LCIS, retroareolar: LCIS grade 2 --------------------------------------------------------------------------------------------------------------------------------------------- 05/17/20:Right lumpectomy ([facility]): scattered foci of residual invasive lobular carcinoma, 0.6-1.0cm, grade 2, clear margins, 2 right axillary lymph nodes negative for carcinoma.  Current treatment: Kadcyla Maintenance, today is cycle6(until September 2022) 1.  Fatigue 2. based on how fatigued she is and the neuropathy symptoms I recommended short-term disability for her.  Peripheral neuropathy: Numbness of the tips of her toes: I discussed with her about participating in the clinical trial [facility] [place] 2102 N-Palmitoylethanolamide, a Cannabimimetic Nutraceutical: A Randomized Double-Blind Phase II Pilot Trial  Return to clinic in 3weeksfor cycle7    No orders of the defined types were placed in this encounter.  The patient has a good understanding of the overall plan. she agrees with it. she will call with any problems that may develop before the next visit here.  Total time spent: 30 mins including face to face time and time spent for planning, charting and coordination of care  Rulon Eisenmenger, MD, MPH  09/15/2020  I, Molly Dorshimer, am acting as scribe for Dr. Nicholas Lose.  I have reviewed the above documentation for accuracy and completeness, and I agree with the above.

## 2020-09-15 ENCOUNTER — Other Ambulatory Visit: Payer: BC Managed Care – PPO

## 2020-09-15 ENCOUNTER — Other Ambulatory Visit: Payer: Self-pay

## 2020-09-15 ENCOUNTER — Inpatient Hospital Stay: Payer: BC Managed Care – PPO

## 2020-09-15 ENCOUNTER — Encounter: Payer: Self-pay | Admitting: *Deleted

## 2020-09-15 ENCOUNTER — Inpatient Hospital Stay (HOSPITAL_BASED_OUTPATIENT_CLINIC_OR_DEPARTMENT_OTHER): Payer: BC Managed Care – PPO | Admitting: Hematology and Oncology

## 2020-09-15 ENCOUNTER — Inpatient Hospital Stay: Payer: BC Managed Care – PPO | Attending: Hematology and Oncology

## 2020-09-15 VITALS — BP 114/56 | HR 70 | Temp 98.2°F | Resp 18

## 2020-09-15 DIAGNOSIS — C50411 Malignant neoplasm of upper-outer quadrant of right female breast: Secondary | ICD-10-CM | POA: Insufficient documentation

## 2020-09-15 DIAGNOSIS — R5383 Other fatigue: Secondary | ICD-10-CM | POA: Diagnosis not present

## 2020-09-15 DIAGNOSIS — G629 Polyneuropathy, unspecified: Secondary | ICD-10-CM | POA: Insufficient documentation

## 2020-09-15 DIAGNOSIS — Z9221 Personal history of antineoplastic chemotherapy: Secondary | ICD-10-CM | POA: Diagnosis not present

## 2020-09-15 DIAGNOSIS — Z170 Estrogen receptor positive status [ER+]: Secondary | ICD-10-CM

## 2020-09-15 DIAGNOSIS — Z95828 Presence of other vascular implants and grafts: Secondary | ICD-10-CM

## 2020-09-15 DIAGNOSIS — Z5111 Encounter for antineoplastic chemotherapy: Secondary | ICD-10-CM | POA: Insufficient documentation

## 2020-09-15 DIAGNOSIS — Z923 Personal history of irradiation: Secondary | ICD-10-CM | POA: Insufficient documentation

## 2020-09-15 LAB — CBC WITH DIFFERENTIAL (CANCER CENTER ONLY)
Abs Immature Granulocytes: 0.01 10*3/uL (ref 0.00–0.07)
Basophils Absolute: 0 10*3/uL (ref 0.0–0.1)
Basophils Relative: 1 %
Eosinophils Absolute: 0.1 10*3/uL (ref 0.0–0.5)
Eosinophils Relative: 3 %
HCT: 40 % (ref 36.0–46.0)
Hemoglobin: 13.7 g/dL (ref 12.0–15.0)
Immature Granulocytes: 0 %
Lymphocytes Relative: 35 %
Lymphs Abs: 1.9 10*3/uL (ref 0.7–4.0)
MCH: 31.2 pg (ref 26.0–34.0)
MCHC: 34.3 g/dL (ref 30.0–36.0)
MCV: 91.1 fL (ref 80.0–100.0)
Monocytes Absolute: 0.4 10*3/uL (ref 0.1–1.0)
Monocytes Relative: 8 %
Neutro Abs: 2.8 10*3/uL (ref 1.7–7.7)
Neutrophils Relative %: 53 %
Platelet Count: 266 10*3/uL (ref 150–400)
RBC: 4.39 MIL/uL (ref 3.87–5.11)
RDW: 13.5 % (ref 11.5–15.5)
WBC Count: 5.3 10*3/uL (ref 4.0–10.5)
nRBC: 0 % (ref 0.0–0.2)

## 2020-09-15 LAB — CMP (CANCER CENTER ONLY)
ALT: 28 U/L (ref 0–44)
AST: 25 U/L (ref 15–41)
Albumin: 3.8 g/dL (ref 3.5–5.0)
Alkaline Phosphatase: 72 U/L (ref 38–126)
Anion gap: 12 (ref 5–15)
BUN: 14 mg/dL (ref 6–20)
CO2: 24 mmol/L (ref 22–32)
Calcium: 9.5 mg/dL (ref 8.9–10.3)
Chloride: 105 mmol/L (ref 98–111)
Creatinine: 0.73 mg/dL (ref 0.44–1.00)
GFR, Estimated: >60 mL/min (ref >60)
Glucose, Bld: 108 mg/dL — ABNORMAL HIGH (ref 70–99)
Potassium: 3.7 mmol/L (ref 3.5–5.1)
Sodium: 141 mmol/L (ref 135–145)
Total Bilirubin: 0.4 mg/dL (ref 0.3–1.2)
Total Protein: 7.5 g/dL (ref 6.5–8.1)

## 2020-09-15 MED ORDER — SODIUM CHLORIDE 0.9 % IV SOLN
Freq: Once | INTRAVENOUS | Status: AC
  Filled 2020-09-15: qty 250

## 2020-09-15 MED ORDER — DIPHENHYDRAMINE HCL 25 MG PO CAPS
50.0000 mg | ORAL_CAPSULE | Freq: Once | ORAL | Status: AC
  Administered 2020-09-15: 50 mg via ORAL

## 2020-09-15 MED ORDER — ACETAMINOPHEN 325 MG PO TABS
ORAL_TABLET | ORAL | Status: AC
  Filled 2020-09-15: qty 2

## 2020-09-15 MED ORDER — ACETAMINOPHEN 325 MG PO TABS
650.0000 mg | ORAL_TABLET | Freq: Once | ORAL | Status: AC
  Administered 2020-09-15: 650 mg via ORAL

## 2020-09-15 MED ORDER — SODIUM CHLORIDE 0.9 % IV SOLN
3.6000 mg/kg | Freq: Once | INTRAVENOUS | Status: AC
  Administered 2020-09-15: 240 mg via INTRAVENOUS
  Filled 2020-09-15: qty 8

## 2020-09-15 MED ORDER — DIPHENHYDRAMINE HCL 25 MG PO CAPS
ORAL_CAPSULE | ORAL | Status: AC
  Filled 2020-09-15: qty 2

## 2020-09-15 MED ORDER — SODIUM CHLORIDE 0.9% FLUSH
10.0000 mL | INTRAVENOUS | Status: DC | PRN
  Administered 2020-09-15: 10 mL
  Filled 2020-09-15: qty 10

## 2020-09-15 MED ORDER — HEPARIN SOD (PORK) LOCK FLUSH 100 UNIT/ML IV SOLN
500.0000 [IU] | Freq: Once | INTRAVENOUS | Status: AC | PRN
  Administered 2020-09-15: 500 [IU]
  Filled 2020-09-15: qty 5

## 2020-09-15 MED ORDER — SODIUM CHLORIDE 0.9% FLUSH
10.0000 mL | Freq: Once | INTRAVENOUS | Status: AC
  Administered 2020-09-15: 10 mL
  Filled 2020-09-15: qty 10

## 2020-09-15 NOTE — Patient Instructions (Signed)
[facility]  Discharge Instructions: °Thank you for choosing [facility] to provide your oncology and hematology care.  ° °If you have a lab appointment with the Cancer Center, please go directly to the Cancer Center and check in at the registration area. °  °Wear comfortable clothing and clothing appropriate for easy access to any Portacath or PICC line.  ° °We strive to give you quality time with your provider. You may need to reschedule your appointment if you arrive late (15 or more minutes).  Arriving late affects you and other patients whose appointments are after yours.  Also, if you miss three or more appointments without notifying the office, you may be dismissed from the clinic at the provider’s discretion.    °  °For prescription refill requests, have your pharmacy contact our office and allow 72 hours for refills to be completed.   ° °Today you received the following chemotherapy and/or immunotherapy agents Kadcyla    °  °To help prevent nausea and vomiting after your treatment, we encourage you to take your nausea medication as directed. ° °BELOW ARE SYMPTOMS THAT SHOULD BE REPORTED IMMEDIATELY: °*FEVER GREATER THAN 100.4 F (38 °C) OR HIGHER °*CHILLS OR SWEATING °*NAUSEA AND VOMITING THAT IS NOT CONTROLLED WITH YOUR NAUSEA MEDICATION °*UNUSUAL SHORTNESS OF BREATH °*UNUSUAL BRUISING OR BLEEDING °*URINARY PROBLEMS (pain or burning when urinating, or frequent urination) °*BOWEL PROBLEMS (unusual diarrhea, constipation, pain near the anus) °TENDERNESS IN MOUTH AND THROAT WITH OR WITHOUT PRESENCE OF ULCERS (sore throat, sores in mouth, or a toothache) °UNUSUAL RASH, SWELLING OR PAIN  °UNUSUAL VAGINAL DISCHARGE OR ITCHING  ° °Items with * indicate a potential emergency and should be followed up as soon as possible or go to the Emergency Department if any problems should occur. ° °Please show the CHEMOTHERAPY ALERT CARD or IMMUNOTHERAPY ALERT CARD at check-in to the  Emergency Department and triage nurse. ° °Should you have questions after your visit or need to cancel or reschedule your appointment, please contact [facility]  Dept: [phone]  and follow the prompts.  Office hours are 8:00 a.m. to 4:30 p.m. Monday - Friday. Please note that voicemails left after 4:00 p.m. may not be returned until the following business day.  We are closed weekends and major holidays. You have access to a nurse at all times for urgent questions. Please call the main number to the clinic Dept: [phone] and follow the prompts. ° ° °For any non-urgent questions, you may also contact your provider using MyChart. We now offer e-Visits for anyone 18 and older to request care online for non-urgent symptoms. For details visit [email or web address]. °  °Also download the MyChart app! Go to the app store, search "MyChart", open the app, select [facility], and log in with your MyChart username and password. ° °Due to Covid, a mask is required upon entering the hospital/clinic. If you do not have a mask, one will be given to you upon arrival. For doctor visits, patients may have 1 support person aged 18 or older with them. For treatment visits, patients cannot have anyone with them due to current Covid guidelines and our immunocompromised population.  ° °

## 2020-09-15 NOTE — Assessment & Plan Note (Signed)
/  25/2021:Screening mammogram showed a right breast asymmetry. Diagnostic mammogram showed a 2.9cm mass at the 9 o'clock position with 2 adjacent masses at the 10 and 8 o'clock positions, 0.9cm and 1.4cm respectively, and an enlarged lymph node, 1.3cm. 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 Neoadjuvant chemotherapy with TCH Perjeta 6 cycles followed by Herceptinand Perjeta versus Kadcylamaintenance for 1 year(also could be randomized compass HER 2 trial) 2. Followed by breast conserving surgery if possible with sentinel lymph node study 3. Followed by adjuvant radiation therapy if patient had lumpectomy 4.Followed by adjuvant antiestrogen therapy SWOG S 1714 neuropathy clinical trial: No adverse effects from participating in the trial 12/31/2019: Breast MRI: Known malignancy 3.1 cm, with non-mass enhancement total measuring 4.2 cm. Additional masses 6 mm and 3 and 4 mm MRI guided biopsy on the 2 additional masses:Invasive lobular cancer with LCIS, retroareolar: LCIS grade 2 --------------------------------------------------------------------------------------------------------------------------------------------- 05/17/20:Right lumpectomy ([facility]): scattered foci of residual invasive lobular carcinoma, 0.6-1.0cm, grade 2, clear margins, 2 right axillary lymph nodes negative for carcinoma.  Current treatment: Kadcyla Maintenance, today is cycle6(until September 2022) 1.  Fatigue 2. based on how fatigued she is and the neuropathy symptoms I recommended short-term disability for her.  Peripheral neuropathy: Numbness of the tips of her toes: She is forgetting to take gabapentin at bedtime.  Return to clinic in 3weeksfor cycle7

## 2020-09-15 NOTE — Research (Signed)
ACCRU-[place]-2102 - TREATMENT OF ESTABLISHED CHEMOTHERAPY-INDUCED NEUROPATHY WITH N-PALMITOYLETHANOLAMIDE, A CANNABIMIMETIC NUTRACEUTICAL: A RANDOMIZED DOUBLE-BLIND PHASE II PILOT TRIAL Patient Michelle Anthony was identified by the research nurse and Dr. Gudena as a potential candidate for the above listed study.  This Clinical Research Nurse met with [name], [id number], on 09/15/20 in a manner and location that ensures patient privacy to discuss participation in the above listed research study.  Patient is Unaccompanied.  A copy of the informed consent document with embedded HIPAA language was provided to the patient.  Patient reads, speaks, and understands English.   Patient was provided with the business card of this Nurse and encouraged to contact the research team with any questions.  Approximately 20 minutes was spent with the patient reviewing the informed consent documents.  Patient was provided the option of taking informed consent documents home to review and was encouraged to review at their convenience with their support network, including other care providers. Patient took the consent documents home to review.   The pt mentioned that she is currently taking gabapentin for neuropathy in her feet.  The pt was informed that she would have to be stop this medication for 1 week prior to enrolling in this study.  Dr. Gudena said that he is fine if the pt wants to stop her gabapentin because it is really not helping the patient's neuropathy.  The pt knows that her participation is completely voluntary. The pt described the neuropathy as a "5" on a 0-10 scale.  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  The pt first mentioned her chemo-induced neuropathy symptoms in Jan. 2022.  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  The pt is currently receiving Kadcyla as a maintenance treatment.  The nurse contacted  the study coordinators about the pt's eligibility status. The pt agreed to read the consent form, and the research nurse will call her next week to discuss her participation.   Nikki L. Eldreth RN, BSN, CCRP Clinical Research Nurse Lead 09/15/2020 11:34 AM   

## 2020-09-15 NOTE — Progress Notes (Signed)
Pt declined 30 min observation. VSS at discarge

## 2020-09-19 ENCOUNTER — Other Ambulatory Visit: Payer: Self-pay | Admitting: *Deleted

## 2020-09-19 ENCOUNTER — Telehealth: Payer: Self-pay | Admitting: *Deleted

## 2020-09-19 DIAGNOSIS — Z170 Estrogen receptor positive status [ER+]: Secondary | ICD-10-CM

## 2020-09-19 DIAGNOSIS — G620 Drug-induced polyneuropathy: Secondary | ICD-10-CM

## 2020-09-19 DIAGNOSIS — C50411 Malignant neoplasm of upper-outer quadrant of right female breast: Secondary | ICD-10-CM

## 2020-09-19 NOTE — Telephone Encounter (Signed)
ACCRU-[place]-2102 - TREATMENT OF ESTABLISHED CHEMOTHERAPY-INDUCED NEUROPATHY WITH N-PALMITOYLETHANOLAMIDE, A CANNABIMIMETIC NUTRACEUTICAL: A RANDOMIZED DOUBLE-BLIND PHASE II PILOT TRIAL  The study confirmed that this pt is not eligible for the above study because she is still receiving Kadcyla (T-DM1).  Since her treatment has chemo attached to trastuzumab, the study states that this chemo agent can cause neuropathy.  All neurotoxic chemotherapy must have been completed > 3 months (90 days) to meet the inclusion criteria for study enrollment.  Dr. Lindi Adie was notified that the pt does not meet the inclusion criteria for study entry.  He said that he will have his nurse, Merleen Nicely, call the pt about possibly increasing her gabapentin for her current neuropathy symptoms since she will not enter the study.  The pt was called and notified that she is not eligible for the study.  The research nurse answered all of her questions.  The pt was thanked for her interest in this trial.  Brion Aliment RN, BSN, CCRP Clinical Research Nurse Lead 09/19/2020 11:40 AM

## 2020-09-20 ENCOUNTER — Other Ambulatory Visit: Payer: Self-pay | Admitting: Hematology and Oncology

## 2020-09-20 MED ORDER — GABAPENTIN 300 MG PO CAPS: 600.0000 mg | ORAL_CAPSULE | Freq: Every day | ORAL | 3 refills | Status: DC

## 2020-10-05 NOTE — Assessment & Plan Note (Signed)
/  25/2021:Screening mammogram showed a right breast asymmetry. Diagnostic mammogram showed a 2.9cm mass at the 9 o'clock position with 2 adjacent masses at the 10 and 8 o'clock positions, 0.9cm and 1.4cm respectively, and an enlarged lymph node, 1.3cm. 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 Neoadjuvant chemotherapy with TCH Perjeta 6 cycles followed by Herceptinand Perjeta versus Kadcylamaintenance for 1 year(also could be randomized compass HER 2 trial) 2. Followed by breast conserving surgery if possible with sentinel lymph node study 3. Followed by adjuvant radiation therapy if patient had lumpectomy 4.Followed by adjuvant antiestrogen therapy SWOG S 1714 neuropathy clinical trial: No adverse effects from participating in the trial 12/31/2019: Breast MRI: Known malignancy 3.1 cm, with non-mass enhancement total measuring 4.2 cm. Additional masses 6 mm and 3 and 4 mm MRI guided biopsy on the 2 additional masses:Invasive lobular cancer with LCIS, retroareolar: LCIS grade 2 --------------------------------------------------------------------------------------------------------------------------------------------- 05/17/20:Right lumpectomy ([facility]): scattered foci of residual invasive lobular carcinoma, 0.6-1.0cm, grade 2, clear margins, 2 right axillary lymph nodes negative for carcinoma.  Current treatment: Kadcyla Maintenance, todayiscycle7(until September 2022) 1.Fatigue 2.based on how fatigued she is and the neuropathy symptoms I recommended short-term disability for her.  Peripheral neuropathy: Numbness of the tips of her toes: Return to clinic in 3weeksfor cycle8

## 2020-10-05 NOTE — Progress Notes (Signed)
Patient Care Team: Katherina Mires, MD as PCP - General (Family Medicine) Rockwell Germany, RN as Oncology Nurse Navigator Mauro Kaufmann, RN as Oncology Nurse Navigator Erroll Luna, MD as Consulting Physician (General Surgery) Nicholas Lose, MD as Consulting Physician (Hematology and Oncology) Gery Pray, MD as Consulting Physician (Radiation Oncology)  DIAGNOSIS:    ICD-10-CM   1. Malignant neoplasm of upper-outer quadrant of right breast in female, estrogen receptor positive ([place])  C50.411    Z17.0       SUMMARY OF ONCOLOGIC HISTORY: Oncology History  Malignant neoplasm of upper-outer quadrant of right breast in female, estrogen receptor positive ([place])  12/16/2019 Initial Diagnosis   Screening mammogram  showed a right breast asymmetry. Diagnostic mammogram showed a 2.9cm mass at the 9 o'clock position with 2 adjacent masses at the 10 and 8 o'clock positions, 0.9cm and 1.4cm respectively, and an enlarged lymph node, 1.3cm.  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 The report date is 12/31/2019.   The Multi-Cancer Panel offered by [facility] includes sequencing and/or deletion duplication testing of the following 85 genes: AIP, ALK, APC, ATM, AXIN2,BAP1,  BARD1, BLM, BMPR1A, BRCA1, BRCA2, BRIP1, CASR, CDC73, CDH1, CDK4, CDKN1B, CDKN1C, CDKN2A (p14ARF), CDKN2A (p16INK4a), CEBPA, CHEK2, CTNNA1, DICER1, DIS3L2, EGFR (c.2369C>T, p.Thr790Met variant only), EPCAM (Deletion/duplication testing only), FH, FLCN, GATA2, GPC3, GREM1 (Promoter region deletion/duplication testing only), HOXB13 (c.251G>A, p.Gly84Glu), HRAS, KIT, MAX, MEN1, MET, MITF (c.952G>A, p.Glu318Lys variant only), MLH1, MSH2, MSH3, MSH6, MUTYH, NBN, NF1,  NF2, NTHL1, PALB2, PDGFRA, PHOX2B, PMS2, POLD1, POLE, POT1, PRKAR1A, PTCH1, PTEN, RAD50, RAD51C, RAD51D, RB1, RECQL4, RET, RNF43, RUNX1, SDHAF2, SDHA (sequence changes only), SDHB, SDHC, SDHD, SMAD4, SMARCA4, SMARCB1, SMARCE1, STK11, SUFU, TERC, TERT, TMEM127, TP53, TSC1, TSC2, VHL, WRN and WT1.   01/06/2020 - 04/22/2020 Chemotherapy   Neoadjuvant chemo with TCH Perjeta x6 cycles        05/17/2020 Surgery   Right lumpectomy ([facility]): scattered foci of residual invasive lobular carcinoma, 0.6-1.0cm, grade 2, clear margins, 2 right axillary lymph nodes negative for carcinoma.   06/02/2020 -  Chemotherapy    Patient is on Treatment Plan: BREAST ADO-TRASTUZUMAB EMTANSINE (KADCYLA) Q21D         CHIEF COMPLIANT: Kadcyla maintenance  INTERVAL HISTORY: Michelle Anthony is a 55 y.o. with above-mentioned history of right breast cancer treated with neoadjuvant chemotherapy, lumpectomy, and is currently on Kadcyla maintenance. She presents to the clinic today for treatment.  She is tolerating Kadcyla extremely well without any problems or concerns.  She is looking forward to completing her treatment in September.  She is planning a trip to [place] world after that.  ALLERGIES:  has No Known Allergies.  MEDICATIONS:  Current Outpatient Medications  Medication Sig Dispense Refill   COLLAGEN PO Apply topically daily.     gabapentin (NEURONTIN) 300 MG capsule Take 2 capsules (600 mg total) by mouth at bedtime. 180 capsule 3   ibuprofen (ADVIL) 200 MG tablet Take 3 tablets (600 mg total) by mouth every 8 (eight) hours as needed.     Multiple Vitamin (MULTIVITAMIN) tablet Take 1 tablet by mouth daily.     Tretinoin (RETIN-A EX) Apply topically.     No current facility-administered medications for this visit.  PHYSICAL EXAMINATION: ECOG PERFORMANCE STATUS: 1 - Symptomatic but completely ambulatory  Vitals:   10/06/20 0938  BP: 117/76  Pulse: 90  Resp: 18  Temp: 97.8 F (36.6 C)  SpO2: 100%    Filed Weights   10/06/20 0938  Weight: 140 lb 8 oz (63.7 kg)    LABORATORY DATA:  I have reviewed the data as listed CMP Latest Ref Rng & Units 09/15/2020 08/25/2020 08/03/2020  Glucose 70 - 99 mg/dL 108(H) 111(H) 100(H)  BUN 6 - 20 mg/dL _0 Creatinine 0.44 - 1.00 mg/dL 0.73 0.71 0.78  Sodium 135 - 145 mmol/L 141 143 143  Potassium 3.5 - 5.1 mmol/L 3.7 3.5 3.8  Chloride 98 - 111 mmol/L 105 105 104  CO2 22 - 32 mmol/L _1 Calcium 8.9 - 10.3 mg/dL 9.5 9.6 9.7  Total Protein 6.5 - 8.1 g/dL 7.5 7.3 7.3  Total Bilirubin 0.3 - 1.2 mg/dL 0.4 0.5 0.3  Alkaline Phos 38 - 126 U/L 72 71 68  AST 15 - 41 U/L _2 ALT 0 - 44 U/L 28 29 35    Lab Results  Component Value Date   WBC 5.0 10/06/2020   HGB 13.5 10/06/2020   HCT 39.9 10/06/2020   MCV 91.7 10/06/2020   PLT 293 10/06/2020   NEUTROABS 2.5 10/06/2020    ASSESSMENT & PLAN:  Malignant neoplasm of upper-outer quadrant of right breast in female, estrogen receptor positive ([place]) /25/2021:Screening mammogram  showed a right breast asymmetry. Diagnostic mammogram showed a 2.9cm mass at the 9 o'clock position with 2 adjacent masses at the 10 and 8 o'clock positions, 0.9cm and 1.4cm respectively, and an enlarged lymph node, 1.3cm.  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 Neoadjuvant chemotherapy with TCH Perjeta 6 cycles followed by Herceptin and Perjeta versus Kadcyla maintenance for 1 year (also could be randomized compass HER 2 trial) 2. Followed by breast conserving surgery if possible with sentinel lymph node study 3. Followed by adjuvant radiation therapy if patient had lumpectomy 4.  Followed by adjuvant antiestrogen therapy SWOG S 1714 neuropathy clinical trial: No adverse effects from participating in the trial 12/31/2019: Breast MRI: Known malignancy 3.1 cm, with  non-mass enhancement total measuring 4.2 cm.  Additional masses 6 mm and 3 and 4 mm MRI guided biopsy on the 2 additional masses: Invasive lobular cancer with LCIS, retroareolar: LCIS grade 2 --------------------------------------------------------------------------------------------------------------------------------------------- 05/17/20: Right lumpectomy ([facility]): scattered foci of residual invasive lobular carcinoma, 0.6-1.0cm, grade 2, clear margins, 2 right axillary lymph nodes negative for carcinoma.   Current treatment: Kadcyla Maintenance, today is cycle 7 (until September 2022) 1.  Fatigue  2. Peripheral neuropathy: Numbness of the tips of her toes: We tried the 600 mg of gabapentin but it led to severe restlessness in the leg.  So she will go back to 300 mg at bedtime.  Return to clinic in 3 weeks for cycle 8   No orders of the defined types were placed in this encounter.  The patient has a good understanding of the overall plan. she agrees with it. she will call with any problems that may develop before the next visit here.  Total time spent: 30 mins including face to face time and time spent for planning, charting and coordination of care  Rulon Eisenmenger, MD, MPH  10/06/2020  I, Thana Ates, am acting as scribe for Dr. Nicholas Lose.  I have reviewed the above documentation for accuracy and completeness, and I agree with the above.

## 2020-10-06 ENCOUNTER — Other Ambulatory Visit: Payer: Self-pay

## 2020-10-06 ENCOUNTER — Inpatient Hospital Stay: Payer: BC Managed Care – PPO

## 2020-10-06 ENCOUNTER — Other Ambulatory Visit: Payer: Self-pay | Admitting: *Deleted

## 2020-10-06 ENCOUNTER — Inpatient Hospital Stay (HOSPITAL_BASED_OUTPATIENT_CLINIC_OR_DEPARTMENT_OTHER): Payer: BC Managed Care – PPO | Admitting: Hematology and Oncology

## 2020-10-06 VITALS — BP 114/71 | HR 66 | Temp 98.4°F | Resp 18

## 2020-10-06 DIAGNOSIS — Z170 Estrogen receptor positive status [ER+]: Secondary | ICD-10-CM | POA: Diagnosis not present

## 2020-10-06 DIAGNOSIS — Z95828 Presence of other vascular implants and grafts: Secondary | ICD-10-CM

## 2020-10-06 DIAGNOSIS — C50411 Malignant neoplasm of upper-outer quadrant of right female breast: Secondary | ICD-10-CM

## 2020-10-06 DIAGNOSIS — Z5181 Encounter for therapeutic drug level monitoring: Secondary | ICD-10-CM

## 2020-10-06 DIAGNOSIS — Z79899 Other long term (current) drug therapy: Secondary | ICD-10-CM

## 2020-10-06 LAB — CBC WITH DIFFERENTIAL/PLATELET
Abs Immature Granulocytes: 0 10*3/uL (ref 0.00–0.07)
Basophils Absolute: 0.1 10*3/uL (ref 0.0–0.1)
Basophils Relative: 1 %
Eosinophils Absolute: 0.2 10*3/uL (ref 0.0–0.5)
Eosinophils Relative: 4 %
HCT: 39.9 % (ref 36.0–46.0)
Hemoglobin: 13.5 g/dL (ref 12.0–15.0)
Immature Granulocytes: 0 %
Lymphocytes Relative: 37 %
Lymphs Abs: 1.8 10*3/uL (ref 0.7–4.0)
MCH: 31 pg (ref 26.0–34.0)
MCHC: 33.8 g/dL (ref 30.0–36.0)
MCV: 91.7 fL (ref 80.0–100.0)
Monocytes Absolute: 0.4 10*3/uL (ref 0.1–1.0)
Monocytes Relative: 9 %
Neutro Abs: 2.5 10*3/uL (ref 1.7–7.7)
Neutrophils Relative %: 49 %
Platelets: 293 10*3/uL (ref 150–400)
RBC: 4.35 MIL/uL (ref 3.87–5.11)
RDW: 13.3 % (ref 11.5–15.5)
WBC: 5 10*3/uL (ref 4.0–10.5)
nRBC: 0 % (ref 0.0–0.2)

## 2020-10-06 LAB — COMPREHENSIVE METABOLIC PANEL
ALT: 26 U/L (ref 0–44)
AST: 23 U/L (ref 15–41)
Albumin: 3.8 g/dL (ref 3.5–5.0)
Alkaline Phosphatase: 67 U/L (ref 38–126)
Anion gap: 10 (ref 5–15)
BUN: 16 mg/dL (ref 6–20)
CO2: 27 mmol/L (ref 22–32)
Calcium: 10.1 mg/dL (ref 8.9–10.3)
Chloride: 104 mmol/L (ref 98–111)
Creatinine, Ser: 0.7 mg/dL (ref 0.44–1.00)
GFR, Estimated: >60 mL/min (ref >60)
Glucose, Bld: 109 mg/dL — ABNORMAL HIGH (ref 70–99)
Potassium: 3.9 mmol/L (ref 3.5–5.1)
Sodium: 141 mmol/L (ref 135–145)
Total Bilirubin: 0.4 mg/dL (ref 0.3–1.2)
Total Protein: 7.6 g/dL (ref 6.5–8.1)

## 2020-10-06 MED ORDER — ACETAMINOPHEN 325 MG PO TABS
ORAL_TABLET | ORAL | Status: AC
  Filled 2020-10-06: qty 2

## 2020-10-06 MED ORDER — SODIUM CHLORIDE 0.9% FLUSH
10.0000 mL | INTRAVENOUS | Status: DC | PRN
  Administered 2020-10-06: 10 mL
  Filled 2020-10-06: qty 10

## 2020-10-06 MED ORDER — ACETAMINOPHEN 325 MG PO TABS
650.0000 mg | ORAL_TABLET | Freq: Once | ORAL | Status: AC
Start: 2020-10-06 — End: 2020-10-06
  Administered 2020-10-06: 650 mg via ORAL

## 2020-10-06 MED ORDER — SODIUM CHLORIDE 0.9% FLUSH
10.0000 mL | Freq: Once | INTRAVENOUS | Status: AC
  Administered 2020-10-06: 10 mL
  Filled 2020-10-06: qty 10

## 2020-10-06 MED ORDER — HEPARIN SOD (PORK) LOCK FLUSH 100 UNIT/ML IV SOLN
500.0000 [IU] | Freq: Once | INTRAVENOUS | Status: AC | PRN
  Administered 2020-10-06: 500 [IU]
  Filled 2020-10-06: qty 5

## 2020-10-06 MED ORDER — DIPHENHYDRAMINE HCL 25 MG PO CAPS
50.0000 mg | ORAL_CAPSULE | Freq: Once | ORAL | Status: AC
  Administered 2020-10-06: 50 mg via ORAL

## 2020-10-06 MED ORDER — SODIUM CHLORIDE 0.9 % IV SOLN
3.6000 mg/kg | Freq: Once | INTRAVENOUS | Status: AC
  Administered 2020-10-06: 240 mg via INTRAVENOUS
  Filled 2020-10-06: qty 8

## 2020-10-06 MED ORDER — SODIUM CHLORIDE 0.9 % IV SOLN
Freq: Once | INTRAVENOUS | Status: AC
Start: 2020-10-06 — End: 2020-10-06
  Filled 2020-10-06: qty 250

## 2020-10-06 MED ORDER — DIPHENHYDRAMINE HCL 25 MG PO CAPS
ORAL_CAPSULE | ORAL | Status: AC
  Filled 2020-10-06: qty 2

## 2020-10-06 NOTE — Patient Instructions (Signed)
[facility] MEDICAL [facility]COLOG[facility]  Discharge Instructions: °Thank you for choosing [facility] to provide your oncology and hematology care.  ° °If you have a lab appointment with the Cancer Center, please go directly to the Cancer Center and check in at the registration area. °  °Wear comfortable clothing and clothing appropriate for easy access to any Portacath or PICC line.  ° °We strive to give you quality time with your provider. You may need to reschedule your appointment if you arrive late (15 or more minutes).  Arriving late affects you and other patients whose appointments are after yours.  Also, if you miss three or more appointments without notifying the office, you may be dismissed from the clinic at the provider’s discretion.    °  °For prescription refill requests, have your pharmacy contact our office and allow 72 hours for refills to be completed.   ° °Today you received the following chemotherapy and/or immunotherapy agents Kadcyla    °  °To help prevent nausea and vomiting after your treatment, we encourage you to take your nausea medication as directed. ° °BELOW ARE SYMPTOMS THAT SHOULD BE REPORTED IMMEDIATELY: °*FEVER GREATER THAN 100.4 F (38 °C) OR HIGHER °*CHILLS OR SWEATING °*NAUSEA AND VOMITING THAT IS NOT CONTROLLED WITH YOUR NAUSEA MEDICATION °*UNUSUAL SHORTNESS OF BREATH °*UNUSUAL BRUISING OR BLEEDING °*URINARY PROBLEMS (pain or burning when urinating, or frequent urination) °*BOWEL PROBLEMS (unusual diarrhea, constipation, pain near the anus) °TENDERNESS IN MOUTH AND THROAT WITH OR WITHOUT PRESENCE OF ULCERS (sore throat, sores in mouth, or a toothache) °UNUSUAL RASH, SWELLING OR PAIN  °UNUSUAL VAGINAL DISCHARGE OR ITCHING  ° °Items with * indicate a potential emergency and should be followed up as soon as possible or go to the Emergency Department if any problems should occur. ° °Please show the CHEMOTHERAPY ALERT CARD or IMMUNOTHERAPY ALERT CARD at check-in to the  Emergency Department and triage nurse. ° °Should you have questions after your visit or need to cancel or reschedule your appointment, please contact [facility]  Dept: [phone]  and follow the prompts.  Office hours are 8:00 a.m. to 4:30 p.m. Monday - Friday. Please note that voicemails left after 4:00 p.m. may not be returned until the following business day.  We are closed weekends and major holidays. You have access to a nurse at all times for urgent questions. Please call the main number to the clinic Dept: [phone] and follow the prompts. ° ° °For any non-urgent questions, you may also contact your provider using MyChart. We now offer e-Visits for anyone 18 and older to request care online for non-urgent symptoms. For details visit [email or web address]. °  °Also download the MyChart app! Go to the app store, search "MyChart", open the app, select [facility], and log in with your MyChart username and password. ° °Due to Covid, a mask is required upon entering the hospital/clinic. If you do not have a mask, one will be given to you upon arrival. For doctor visits, patients may have 1 support person aged 18 or older with them. For treatment visits, patients cannot have anyone with them due to current Covid guidelines and our immunocompromised population.  ° °

## 2020-10-06 NOTE — Progress Notes (Signed)
Pt denies complaints after Kadcyla. Declined 30 post observation. VSS at discharge.

## 2020-10-25 ENCOUNTER — Other Ambulatory Visit: Payer: Self-pay

## 2020-10-25 ENCOUNTER — Ambulatory Visit (HOSPITAL_COMMUNITY)
Admission: RE | Admit: 2020-10-25 | Discharge: 2020-10-25 | Disposition: A | Discharge status: Home / Self Care | Payer: BC Managed Care – PPO | Source: Ambulatory Visit | Attending: Hematology and Oncology | Admitting: Hematology and Oncology

## 2020-10-25 DIAGNOSIS — Z01818 Encounter for other preprocedural examination: Secondary | ICD-10-CM | POA: Diagnosis not present

## 2020-10-25 DIAGNOSIS — Z79899 Other long term (current) drug therapy: Secondary | ICD-10-CM | POA: Insufficient documentation

## 2020-10-25 DIAGNOSIS — C50919 Malignant neoplasm of unspecified site of unspecified female breast: Secondary | ICD-10-CM | POA: Insufficient documentation

## 2020-10-25 DIAGNOSIS — Z0189 Encounter for other specified special examinations: Secondary | ICD-10-CM

## 2020-10-25 DIAGNOSIS — Z5181 Encounter for therapeutic drug level monitoring: Secondary | ICD-10-CM | POA: Insufficient documentation

## 2020-10-25 LAB — ECHOCARDIOGRAM COMPLETE
Area-P 1/2: 2.42 cm2
S' Lateral: 2.9 cm

## 2020-10-25 NOTE — Progress Notes (Signed)
  Echocardiogram 2D Echocardiogram has been performed.  Michelle Anthony 10/25/2020, 10:08 AM

## 2020-10-26 NOTE — Progress Notes (Signed)
Patient Care Team: Katherina Mires, MD as PCP - General (Family Medicine) Rockwell Germany, RN as Oncology Nurse Navigator Mauro Kaufmann, RN as Oncology Nurse Navigator Erroll Luna, MD as Consulting Physician (General Surgery) Nicholas Lose, MD as Consulting Physician (Hematology and Oncology) Gery Pray, MD as Consulting Physician (Radiation Oncology)  DIAGNOSIS:    ICD-10-CM   1. Malignant neoplasm of upper-outer quadrant of right breast in female, estrogen receptor positive ([place])  C50.411    Z17.0       SUMMARY OF ONCOLOGIC HISTORY: Oncology History  Malignant neoplasm of upper-outer quadrant of right breast in female, estrogen receptor positive ([place])  12/16/2019 Initial Diagnosis   Screening mammogram  showed a right breast asymmetry. Diagnostic mammogram showed a 2.9cm mass at the 9 o'clock position with 2 adjacent masses at the 10 and 8 o'clock positions, 0.9cm and 1.4cm respectively, and an enlarged lymph node, 1.3cm.  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 The report date is 12/31/2019.   The Multi-Cancer Panel offered by [facility] includes sequencing and/or deletion duplication testing of the following 85 genes: AIP, ALK, APC, ATM, AXIN2,BAP1,  BARD1, BLM, BMPR1A, BRCA1, BRCA2, BRIP1, CASR, CDC73, CDH1, CDK4, CDKN1B, CDKN1C, CDKN2A (p14ARF), CDKN2A (p16INK4a), CEBPA, CHEK2, CTNNA1, DICER1, DIS3L2, EGFR (c.2369C>T, p.Thr790Met variant only), EPCAM (Deletion/duplication testing only), FH, FLCN, GATA2, GPC3, GREM1 (Promoter region deletion/duplication testing only), HOXB13 (c.251G>A, p.Gly84Glu), HRAS, KIT, MAX, MEN1, MET, MITF (c.952G>A, p.Glu318Lys variant only), MLH1, MSH2, MSH3, MSH6, MUTYH, NBN, NF1,  NF2, NTHL1, PALB2, PDGFRA, PHOX2B, PMS2, POLD1, POLE, POT1, PRKAR1A, PTCH1, PTEN, RAD50, RAD51C, RAD51D, RB1, RECQL4, RET, RNF43, RUNX1, SDHAF2, SDHA (sequence changes only), SDHB, SDHC, SDHD, SMAD4, SMARCA4, SMARCB1, SMARCE1, STK11, SUFU, TERC, TERT, TMEM127, TP53, TSC1, TSC2, VHL, WRN and WT1.   01/06/2020 - 04/22/2020 Chemotherapy   Neoadjuvant chemo with TCH Perjeta x6 cycles        05/17/2020 Surgery   Right lumpectomy ([facility]): scattered foci of residual invasive lobular carcinoma, 0.6-1.0cm, grade 2, clear margins, 2 right axillary lymph nodes negative for carcinoma.   06/02/2020 -  Chemotherapy    Patient is on Treatment Plan: BREAST ADO-TRASTUZUMAB EMTANSINE (KADCYLA) Q21D         CHIEF COMPLIANT: Kadcyla maintenance  INTERVAL HISTORY: Michelle FIGGE is a 55 y.o. with above-mentioned history of right breast cancer treated with neoadjuvant chemotherapy, lumpectomy, and is currently on Kadcyla maintenance. She presents to the clinic today for treatment.   ALLERGIES:  has No Known Allergies.  MEDICATIONS:  Current Outpatient Medications  Medication Sig Dispense Refill   COLLAGEN PO Apply topically daily.     gabapentin (NEURONTIN) 300 MG capsule Take 2 capsules (600 mg total) by mouth at bedtime. 180 capsule 3   ibuprofen (ADVIL) 200 MG tablet Take 3 tablets (600 mg total) by mouth every 8 (eight) hours as needed.     Multiple Vitamin (MULTIVITAMIN) tablet Take 1 tablet by mouth daily.     Tretinoin (RETIN-A EX) Apply topically.     No current facility-administered medications for this visit.    PHYSICAL EXAMINATION: ECOG PERFORMANCE STATUS: 1 - Symptomatic but completely ambulatory  There were no vitals filed for this visit. There were no vitals filed for this visit.  LABORATORY  DATA:  I have reviewed the data as listed CMP Latest Ref Rng & Units 10/06/2020 09/15/2020 08/25/2020  Glucose 70 - 99 mg/dL 109(H) 108(H) 111(H)  BUN 6 - 20 mg/dL _0 Creatinine 0.44 -  1.00 mg/dL 0.70 0.73 0.71  Sodium 135 - 145 mmol/L 141 141 143  Potassium 3.5 - 5.1 mmol/L 3.9 3.7 3.5  Chloride 98 - 111 mmol/L 104 105 105  CO2 22 - 32 mmol/L _1 Calcium 8.9 - 10.3 mg/dL 10.1 9.5 9.6  Total Protein 6.5 - 8.1 g/dL 7.6 7.5 7.3  Total Bilirubin 0.3 - 1.2 mg/dL 0.4 0.4 0.5  Alkaline Phos 38 - 126 U/L 67 72 71  AST 15 - 41 U/L _2 ALT 0 - 44 U/L _3 Lab Results  Component Value Date   WBC 5.0 10/06/2020   HGB 13.5 10/06/2020   HCT 39.9 10/06/2020   MCV 91.7 10/06/2020   PLT 293 10/06/2020   NEUTROABS 2.5 10/06/2020    ASSESSMENT & PLAN:  Malignant neoplasm of upper-outer quadrant of right breast in female, estrogen receptor positive ([place]) /25/2021:Screening mammogram  showed a right breast asymmetry. Diagnostic mammogram showed a 2.9cm mass at the 9 o'clock position with 2 adjacent masses at the 10 and 8 o'clock positions, 0.9cm and 1.4cm respectively, and an enlarged lymph node, 1.3cm.  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 Neoadjuvant chemotherapy with TCH Perjeta 6 cycles followed by Herceptin and Perjeta versus Kadcyla maintenance for 1 year (also could be randomized compass HER 2 trial) 2. Followed by breast conserving surgery if possible with sentinel lymph node study 3. Followed by adjuvant radiation therapy if patient had lumpectomy 4.  Followed by adjuvant antiestrogen therapy SWOG S 1714 neuropathy clinical trial: No adverse effects from participating in the trial 12/31/2019: Breast MRI: Known malignancy 3.1 cm, with non-mass enhancement total measuring 4.2 cm.  Additional masses 6 mm and 3 and 4 mm MRI guided biopsy on the 2 additional masses: Invasive lobular cancer with LCIS, retroareolar: LCIS grade  2 --------------------------------------------------------------------------------------------------------------------------------------------- 05/17/20: Right lumpectomy ([facility]): scattered foci of residual invasive lobular carcinoma, 0.6-1.0cm, grade 2, clear margins, 2 right axillary lymph nodes negative for carcinoma.   Current treatment: Kadcyla Maintenance, today is cycle 7 (until September 2022) 1.  Fatigue 2. neuropathy: Mild to moderate   Patient came back from vacation to [place] and had a wonderful time.  She is hoping to go to [place] Return to clinic in 3 weeks for cycle 8      No orders of the defined types were placed in this encounter.  The patient has a good understanding of the overall plan. she agrees with it. she will call with any problems that may develop before the next visit here.  Total time spent: 30 mins including face to face time and time spent for planning, charting and coordination of care  Rulon Eisenmenger, MD, MPH 10/27/2020  I, Thana Ates, am acting as scribe for Dr. Nicholas Lose.  I have reviewed the above documentation for accuracy and completeness, and I agree with the above.

## 2020-10-27 ENCOUNTER — Inpatient Hospital Stay (HOSPITAL_BASED_OUTPATIENT_CLINIC_OR_DEPARTMENT_OTHER): Payer: BC Managed Care – PPO | Admitting: Hematology and Oncology

## 2020-10-27 ENCOUNTER — Inpatient Hospital Stay: Payer: BC Managed Care – PPO | Attending: Hematology and Oncology

## 2020-10-27 ENCOUNTER — Inpatient Hospital Stay: Payer: BC Managed Care – PPO

## 2020-10-27 ENCOUNTER — Other Ambulatory Visit: Payer: Self-pay

## 2020-10-27 DIAGNOSIS — Z5112 Encounter for antineoplastic immunotherapy: Secondary | ICD-10-CM | POA: Insufficient documentation

## 2020-10-27 DIAGNOSIS — Z170 Estrogen receptor positive status [ER+]: Secondary | ICD-10-CM | POA: Diagnosis not present

## 2020-10-27 DIAGNOSIS — G620 Drug-induced polyneuropathy: Secondary | ICD-10-CM | POA: Insufficient documentation

## 2020-10-27 DIAGNOSIS — C50411 Malignant neoplasm of upper-outer quadrant of right female breast: Secondary | ICD-10-CM

## 2020-10-27 DIAGNOSIS — R5383 Other fatigue: Secondary | ICD-10-CM | POA: Diagnosis not present

## 2020-10-27 DIAGNOSIS — Z95828 Presence of other vascular implants and grafts: Secondary | ICD-10-CM

## 2020-10-27 DIAGNOSIS — Z79899 Other long term (current) drug therapy: Secondary | ICD-10-CM | POA: Insufficient documentation

## 2020-10-27 LAB — COMPREHENSIVE METABOLIC PANEL
ALT: 26 U/L (ref 0–44)
AST: 26 U/L (ref 15–41)
Albumin: 3.7 g/dL (ref 3.5–5.0)
Alkaline Phosphatase: 68 U/L (ref 38–126)
Anion gap: 7 (ref 5–15)
BUN: 16 mg/dL (ref 6–20)
CO2: 29 mmol/L (ref 22–32)
Calcium: 9.7 mg/dL (ref 8.9–10.3)
Chloride: 104 mmol/L (ref 98–111)
Creatinine, Ser: 0.72 mg/dL (ref 0.44–1.00)
GFR, Estimated: >60 mL/min (ref >60)
Glucose, Bld: 84 mg/dL (ref 70–99)
Potassium: 4 mmol/L (ref 3.5–5.1)
Sodium: 140 mmol/L (ref 135–145)
Total Bilirubin: 0.4 mg/dL (ref 0.3–1.2)
Total Protein: 7.6 g/dL (ref 6.5–8.1)

## 2020-10-27 LAB — CBC WITH DIFFERENTIAL/PLATELET
Abs Immature Granulocytes: 0 10*3/uL (ref 0.00–0.07)
Basophils Absolute: 0 10*3/uL (ref 0.0–0.1)
Basophils Relative: 1 %
Eosinophils Absolute: 0.1 10*3/uL (ref 0.0–0.5)
Eosinophils Relative: 3 %
HCT: 38.3 % (ref 36.0–46.0)
Hemoglobin: 13.3 g/dL (ref 12.0–15.0)
Immature Granulocytes: 0 %
Lymphocytes Relative: 32 %
Lymphs Abs: 1.8 10*3/uL (ref 0.7–4.0)
MCH: 31.7 pg (ref 26.0–34.0)
MCHC: 34.7 g/dL (ref 30.0–36.0)
MCV: 91.2 fL (ref 80.0–100.0)
Monocytes Absolute: 0.5 10*3/uL (ref 0.1–1.0)
Monocytes Relative: 9 %
Neutro Abs: 3.1 10*3/uL (ref 1.7–7.7)
Neutrophils Relative %: 55 %
Platelets: 249 10*3/uL (ref 150–400)
RBC: 4.2 MIL/uL (ref 3.87–5.11)
RDW: 13.6 % (ref 11.5–15.5)
WBC: 5.6 10*3/uL (ref 4.0–10.5)
nRBC: 0 % (ref 0.0–0.2)

## 2020-10-27 MED ORDER — ACETAMINOPHEN 325 MG PO TABS
ORAL_TABLET | ORAL | Status: AC
  Filled 2020-10-27: qty 2

## 2020-10-27 MED ORDER — SODIUM CHLORIDE 0.9 % IV SOLN
3.6000 mg/kg | Freq: Once | INTRAVENOUS | Status: AC
  Administered 2020-10-27: 240 mg via INTRAVENOUS
  Filled 2020-10-27: qty 8

## 2020-10-27 MED ORDER — DIPHENHYDRAMINE HCL 25 MG PO CAPS
50.0000 mg | ORAL_CAPSULE | Freq: Once | ORAL | Status: AC
  Administered 2020-10-27: 50 mg via ORAL

## 2020-10-27 MED ORDER — HEPARIN SOD (PORK) LOCK FLUSH 100 UNIT/ML IV SOLN
500.0000 [IU] | Freq: Once | INTRAVENOUS | Status: AC | PRN
  Administered 2020-10-27: 500 [IU]
  Filled 2020-10-27: qty 5

## 2020-10-27 MED ORDER — SODIUM CHLORIDE 0.9% FLUSH
10.0000 mL | INTRAVENOUS | Status: DC | PRN
  Administered 2020-10-27: 10 mL
  Filled 2020-10-27: qty 10

## 2020-10-27 MED ORDER — PROCHLORPERAZINE MALEATE 10 MG PO TABS: 10.0000 mg | ORAL_TABLET | Freq: Four times a day (QID) | ORAL | 1 refills | Status: DC | PRN

## 2020-10-27 MED ORDER — SODIUM CHLORIDE 0.9% FLUSH
10.0000 mL | Freq: Once | INTRAVENOUS | Status: AC
  Administered 2020-10-27: 10 mL
  Filled 2020-10-27: qty 10

## 2020-10-27 MED ORDER — ACETAMINOPHEN 325 MG PO TABS
650.0000 mg | ORAL_TABLET | Freq: Once | ORAL | Status: AC
Start: 2020-10-27 — End: 2020-10-27
  Administered 2020-10-27: 650 mg via ORAL

## 2020-10-27 MED ORDER — SODIUM CHLORIDE 0.9 % IV SOLN
Freq: Once | INTRAVENOUS | Status: AC
  Filled 2020-10-27: qty 250

## 2020-10-27 MED ORDER — DIPHENHYDRAMINE HCL 25 MG PO CAPS
ORAL_CAPSULE | ORAL | Status: AC
  Filled 2020-10-27: qty 2

## 2020-10-27 NOTE — Assessment & Plan Note (Signed)
/  25/2021:Screening mammogram showed a right breast asymmetry. Diagnostic mammogram showed a 2.9cm mass at the 9 o'clock position with 2 adjacent masses at the 10 and 8 o'clock positions, 0.9cm and 1.4cm respectively, and an enlarged lymph node, 1.3cm. 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 Neoadjuvant chemotherapy with TCH Perjeta 6 cycles followed by Herceptinand Perjeta versus Kadcylamaintenance for 1 year(also could be randomized compass HER 2 trial) 2. Followed by breast conserving surgery if possible with sentinel lymph node study 3. Followed by adjuvant radiation therapy if patient had lumpectomy 4.Followed by adjuvant antiestrogen therapy SWOG S 1714 neuropathy clinical trial: No adverse effects from participating in the trial 12/31/2019: Breast MRI: Known malignancy 3.1 cm, with non-mass enhancement total measuring 4.2 cm. Additional masses 6 mm and 3 and 4 mm MRI guided biopsy on the 2 additional masses:Invasive lobular cancer with LCIS, retroareolar: LCIS grade 2 --------------------------------------------------------------------------------------------------------------------------------------------- 05/17/20:Right lumpectomy ([facility]): scattered foci of residual invasive lobular carcinoma, 0.6-1.0cm, grade 2, clear margins, 2 right axillary lymph nodes negative for carcinoma.  Current treatment: Kadcyla Maintenance, todayiscycle7(until September 2022) 1.Fatigue 2.based on how fatigued she is and the neuropathy symptoms I recommended short-term disability for her.  Peripheral neuropathy: Numbness of the tips of her toes: I discussed with her about participating in the clinical trial [facility] [place] [address]-Pal[address]oylethanolamide, a Cannabimimetic Nutraceutical: A Randomized Double-Blind Phase II Pilot Trial  Return to clinic in  3weeksfor cycle8

## 2020-10-27 NOTE — Patient Instructions (Signed)
Ado-Trastuzumab Emtansine for injection What is this medication? ADO-TRASTUZUMAB EMTANSINE (ADD oh traz TOO zuh mab em TAN zine) is a monoclonalantibody combined with chemotherapy. It is used to treat breast cancer. This medicine may be used for other purposes; ask your health care provider orpharmacist if you have questions. COMMON BRAND NAME(S): Kadcyla What should I tell my care team before I take this medication? They need to know if you have any of these conditions: heart disease heart failure infection (especially a virus infection such as chickenpox, cold sores, or herpes) liver disease lung or breathing disease, like asthma tingling of the fingers or toes, or other nerve disorder an unusual or allergic reaction to ado-trastuzumab emtansine, other medications, foods, dyes, or preservatives pregnant or trying to get pregnant breast-feeding How should I use this medication? This medicine is for infusion into a vein. It is given by a health careprofessional in a hospital or clinic setting. Talk to your pediatrician regarding the use of this medicine in children.Special care may be needed. Overdosage: If you think you have taken too much of this medicine contact apoison control center or emergency room at once. NOTE: This medicine is only for you. Do not share this medicine with others. What if I miss a dose? It is important not to miss your dose. Call your doctor or health careprofessional if you are unable to keep an appointment. What may interact with this medication? This medicine may also interact with the following medications: atazanavir boceprevir clarithromycin delavirdine indinavir dalfopristin; quinupristin isoniazid, INH itraconazole ketoconazole nefazodone nelfinavir ritonavir telaprevir telithromycin tipranavir voriconazole This list may not describe all possible interactions. Give your health care provider a list of all the medicines, herbs, non-prescription  drugs, or dietary supplements you use. Also tell them if you smoke, drink alcohol, or use illegaldrugs. Some items may interact with your medicine. What should I watch for while using this medication? Visit your doctor for checks on your progress. This drug may make you feel generally unwell. This is not uncommon, as chemotherapy can affect healthy cells as well as cancer cells. Report any side effects. Continue your course oftreatment even though you feel ill unless your doctor tells you to stop. You may need blood work done while you are taking this medicine. Call your doctor or health care professional for advice if you get a fever, chills or sore throat, or other symptoms of a cold or flu. Do not treat yourself. This drug decreases your body's ability to fight infections. Try toavoid being around people who are sick. Be careful brushing and flossing your teeth or using a toothpick because you may get an infection or bleed more easily. If you have any dental work done,tell your dentist you are receiving this medicine. Avoid taking products that contain aspirin, acetaminophen, ibuprofen, naproxen, or ketoprofen unless instructed by your doctor. These medicines may hide afever. Do not become pregnant while taking this medicine or for 7 months after stopping it, men with female partners should use contraception during treatment and for 4 months after the last dose. Women should inform their doctor if they wish to become pregnant or think they might be pregnant. There is a potential for serious side effects to an unborn child. Do not breast-feed an infant whiletaking this medicine or for 7 months after the last dose. Men who have a partner who is pregnant or who is capable of becoming pregnant should use a condom during sexual activity while taking this medicine and for 4 months after  stopping it. Men should inform their doctors if they wish to father a child. This medicine may lower sperm counts. Talk to  your health careprofessional or pharmacist for more information. What side effects may I notice from receiving this medication? Side effects that you should report to your doctor or health care professionalas soon as possible: allergic reactions like skin rash, itching or hives, swelling of the face, lips, or tongue breathing problems chest pain or palpitations fever or chills, sore throat general ill feeling or flu-like symptoms light-colored stools nausea, vomiting pain, tingling, numbness in the hands or feet signs and symptoms of bleeding such as bloody or black, tarry stools; red or dark-brown urine; spitting up blood or brown material that looks like coffee grounds; red spots on the skin; unusual bruising or bleeding from the eye, gums, or nose swelling of the legs or ankles yellowing of the eyes or skin Side effects that usually do not require medical attention (report to yourdoctor or health care professional if they continue or are bothersome): changes in taste constipation dizziness headache joint pain muscle pain trouble sleeping unusually weak or tired This list may not describe all possible side effects. Call your doctor for medical advice about side effects. You may report side effects to FDA at1-800-FDA-1088. Where should I keep my medication? This drug is given in a hospital or clinic and will not be stored at home. NOTE: This sheet is a summary. It may not cover all possible information. If you have questions about this medicine, talk to your doctor, pharmacist, orhealth care provider.  2022 Elsevier/Gold Standard (2017-08-30 10:03:15)

## 2020-10-27 NOTE — Progress Notes (Signed)
Patient tolerated tx w/o any issues, declined to stay the 30 minute post infusion observation.

## 2020-11-16 NOTE — Assessment & Plan Note (Signed)
12/16/2019:Screening mammogram showed a right breast asymmetry. Diagnostic mammogram showed a 2.9cm mass at the 9 o'clock position with 2 adjacent masses at the 10 and 8 o'clock positions, 0.9cm and 1.4cm respectively, and an enlarged lymph node, 1.3cm. 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 Neoadjuvant chemotherapy with TCH Perjeta 6 cycles followed by Herceptinand Perjeta versus Kadcylamaintenance for 1 year(also could be randomized compass HER 2 trial) 2. Followed by breast conserving surgery if possible with sentinel lymph node study 3. Followed by adjuvant radiation therapy if patient had lumpectomy 4.Followed by adjuvant antiestrogen therapy SWOG S 1714 neuropathy clinical trial: No adverse effects from participating in the trial 12/31/2019: Breast MRI: Known malignancy 3.1 cm, with non-mass enhancement total measuring 4.2 cm. Additional masses 6 mm and 3 and 4 mm MRI guided biopsy on the 2 additional masses:Invasive lobular cancer with LCIS, retroareolar: LCIS grade 2 --------------------------------------------------------------------------------------------------------------------------------------------- 05/17/20:Right lumpectomy ([facility]): scattered foci of residual invasive lobular carcinoma, 0.6-1.0cm, grade 2, clear margins, 2 right axillary lymph nodes negative for carcinoma.  Current treatment: Kadcyla Maintenance, todayiscycle8(until September 2022) 1.Fatigue 2.neuropathy: Mild to moderate  Patient came back from vacation to [place] and had a wonderful time.  She is hoping to go to [place] Return to clinic in [employer] cycle9

## 2020-11-16 NOTE — Progress Notes (Signed)
Patient Care Team: Katherina Mires, MD as PCP - General (Family Medicine) Rockwell Germany, RN as Oncology Nurse Navigator Mauro Kaufmann, RN as Oncology Nurse Navigator Erroll Luna, MD as Consulting Physician (General Surgery) Nicholas Lose, MD as Consulting Physician (Hematology and Oncology) Gery Pray, MD as Consulting Physician (Radiation Oncology)  DIAGNOSIS:    ICD-10-CM   1. Malignant neoplasm of upper-outer quadrant of right breast in female, estrogen receptor positive ([place])  C50.411    Z17.0       SUMMARY OF ONCOLOGIC HISTORY: Oncology History  Malignant neoplasm of upper-outer quadrant of right breast in female, estrogen receptor positive ([place])  12/16/2019 Initial Diagnosis   Screening mammogram  showed a right breast asymmetry. Diagnostic mammogram showed a 2.9cm mass at the 9 o'clock position with 2 adjacent masses at the 10 and 8 o'clock positions, 0.9cm and 1.4cm respectively, and an enlarged lymph node, 1.3cm.  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 The report date is 12/31/2019.   The Multi-Cancer Panel offered by [facility] includes sequencing and/or deletion duplication testing of the following 85 genes: AIP, ALK, APC, ATM, AXIN2,BAP1,  BARD1, BLM, BMPR1A, BRCA1, BRCA2, BRIP1, CASR, CDC73, CDH1, CDK4, CDKN1B, CDKN1C, CDKN2A (p14ARF), CDKN2A (p16INK4a), CEBPA, CHEK2, CTNNA1, DICER1, DIS3L2, EGFR (c.2369C>T, p.Thr790Met variant only), EPCAM (Deletion/duplication testing only), FH, FLCN, GATA2, GPC3, GREM1 (Promoter region deletion/duplication testing only), HOXB13 (c.251G>A, p.Gly84Glu), HRAS, KIT, MAX, MEN1, MET, MITF (c.952G>A, p.Glu318Lys variant only), MLH1, MSH2, MSH3, MSH6, MUTYH, NBN, NF1,  NF2, NTHL1, PALB2, PDGFRA, PHOX2B, PMS2, POLD1, POLE, POT1, PRKAR1A, PTCH1, PTEN, RAD50, RAD51C, RAD51D, RB1, RECQL4, RET, RNF43, RUNX1, SDHAF2, SDHA (sequence changes only), SDHB, SDHC, SDHD, SMAD4, SMARCA4, SMARCB1, SMARCE1, STK11, SUFU, TERC, TERT, TMEM127, TP53, TSC1, TSC2, VHL, WRN and WT1.   01/06/2020 - 04/22/2020 Chemotherapy   Neoadjuvant chemo with TCH Perjeta x6 cycles        05/17/2020 Surgery   Right lumpectomy ([facility]): scattered foci of residual invasive lobular carcinoma, 0.6-1.0cm, grade 2, clear margins, 2 right axillary lymph nodes negative for carcinoma.   06/02/2020 -  Chemotherapy    Patient is on Treatment Plan: BREAST ADO-TRASTUZUMAB EMTANSINE (KADCYLA) Q21D         CHIEF COMPLIANT: Kadcyla maintenance  INTERVAL HISTORY: Michelle Anthony is a 55 y.o. with above-mentioned history of right breast cancer treated with neoadjuvant chemotherapy, lumpectomy, and is currently on Kadcyla maintenance. She presents to the clinic today for treatment.   ALLERGIES:  has No Known Allergies.  MEDICATIONS:  Current Outpatient Medications  Medication Sig Dispense Refill   COLLAGEN PO Apply topically daily.     gabapentin (NEURONTIN) 300 MG capsule Take 2 capsules (600 mg total) by mouth at bedtime. 180 capsule 3   ibuprofen (ADVIL) 200 MG tablet Take 3 tablets (600 mg total) by mouth every 8 (eight) hours as needed.     Multiple Vitamin (MULTIVITAMIN) tablet Take 1 tablet by mouth daily.     prochlorperazine (COMPAZINE) 10 MG tablet Take 1 tablet (10 mg total) by mouth every 6 (six) hours as needed (Nausea or vomiting). 60 tablet 1   Tretinoin (RETIN-A EX) Apply topically.     No current facility-administered medications for this visit.    PHYSICAL EXAMINATION: ECOG  PERFORMANCE STATUS: 1 - Symptomatic but completely ambulatory  There were no vitals filed for this visit. There were no vitals filed for this visit.  LABORATORY DATA:  I have reviewed the data as  listed CMP Latest Ref Rng & Units 10/27/2020 10/06/2020 09/15/2020  Glucose 70 - 99 mg/dL 84 109(H) 108(H)  BUN 6 - 20 mg/dL _0 Creatinine 0.44 - 1.00 mg/dL 0.72 0.70 0.73  Sodium 135 - 145 mmol/L 140 141 141  Potassium 3.5 - 5.1 mmol/L 4.0 3.9 3.7  Chloride 98 - 111 mmol/L 104 104 105  CO2 22 - 32 mmol/L _1 Calcium 8.9 - 10.3 mg/dL 9.7 10.1 9.5  Total Protein 6.5 - 8.1 g/dL 7.6 7.6 7.5  Total Bilirubin 0.3 - 1.2 mg/dL 0.4 0.4 0.4  Alkaline Phos 38 - 126 U/L 68 67 72  AST 15 - 41 U/L _2 ALT 0 - 44 U/L _3 Lab Results  Component Value Date   WBC 5.6 10/27/2020   HGB 13.3 10/27/2020   HCT 38.3 10/27/2020   MCV 91.2 10/27/2020   PLT 249 10/27/2020   NEUTROABS 3.1 10/27/2020    ASSESSMENT & PLAN:  Malignant neoplasm of upper-outer quadrant of right breast in female, estrogen receptor positive ([place]) 12/16/2019:Screening mammogram  showed a right breast asymmetry. Diagnostic mammogram showed a 2.9cm mass at the 9 o'clock position with 2 adjacent masses at the 10 and 8 o'clock positions, 0.9cm and 1.4cm respectively, and an enlarged lymph node, 1.3cm.  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 Neoadjuvant chemotherapy with TCH Perjeta 6 cycles followed by Herceptin and Perjeta versus Kadcyla maintenance for 1 year (also could be randomized compass HER 2 trial) 2. Followed by breast conserving surgery if possible with sentinel lymph node study 3. Followed by adjuvant radiation therapy completed April 2022 4.  Followed by adjuvant antiestrogen therapy started 11/17/2020 5.  Followed by neratinib (to be started in February 2023) SWOG S 1714 neuropathy clinical trial: No adverse effects from participating in the trial 12/31/2019: Breast MRI: Known malignancy 3.1 cm, with non-mass enhancement total measuring 4.2 cm.   Additional masses 6 mm and 3 and 4 mm MRI guided biopsy on the 2 additional masses: Invasive lobular cancer with LCIS, retroareolar: LCIS grade 2 --------------------------------------------------------------------------------------------------------------------------------------------- 05/17/20: Right lumpectomy ([facility]): scattered foci of residual invasive lobular carcinoma, 0.6-1.0cm, grade 2, clear margins, 2 right axillary lymph nodes negative for carcinoma.   Current treatment: Kadcyla Maintenance, today is cycle 8 (until September 2022) 1.  Fatigue 2. neuropathy: Mild to moderate   Letrozole counseling: We discussed the risks and benefits of anti-estrogen therapy with aromatase inhibitors. These include but not limited to insomnia, hot flashes, mood changes, vaginal dryness, bone density loss, and weight gain. We strongly believe that the benefits far outweigh the risks. Patient understands these risks and consented to starting treatment. Planned treatment duration is 7 years.  Return to clinic in 3 weeks for cycle 9.  She has 2 more doses of Kadcyla left.    No orders of the defined types were placed in this encounter.  The patient has a good understanding of the overall plan. she agrees with it. she will call with any problems that may develop before the next visit here.  Total time spent: 30 mins  including face to face time and time spent for planning, charting and coordination of care  Rulon Eisenmenger, MD, MPH 11/17/2020  I, Thana Ates, am acting as scribe for Dr. Nicholas Lose.  I have reviewed the above documentation for accuracy and completeness, and I agree with the above.

## 2020-11-17 ENCOUNTER — Inpatient Hospital Stay: Payer: BC Managed Care – PPO

## 2020-11-17 ENCOUNTER — Other Ambulatory Visit: Payer: Self-pay

## 2020-11-17 ENCOUNTER — Inpatient Hospital Stay: Payer: BC Managed Care – PPO | Attending: Hematology and Oncology

## 2020-11-17 ENCOUNTER — Inpatient Hospital Stay (HOSPITAL_BASED_OUTPATIENT_CLINIC_OR_DEPARTMENT_OTHER): Payer: BC Managed Care – PPO | Admitting: Hematology and Oncology

## 2020-11-17 VITALS — BP 106/63 | HR 70 | Temp 98.0°F | Resp 16

## 2020-11-17 DIAGNOSIS — C50411 Malignant neoplasm of upper-outer quadrant of right female breast: Secondary | ICD-10-CM | POA: Diagnosis present

## 2020-11-17 DIAGNOSIS — Z5112 Encounter for antineoplastic immunotherapy: Secondary | ICD-10-CM | POA: Diagnosis not present

## 2020-11-17 DIAGNOSIS — Z170 Estrogen receptor positive status [ER+]: Secondary | ICD-10-CM | POA: Insufficient documentation

## 2020-11-17 DIAGNOSIS — Z79899 Other long term (current) drug therapy: Secondary | ICD-10-CM | POA: Diagnosis not present

## 2020-11-17 DIAGNOSIS — Z79811 Long term (current) use of aromatase inhibitors: Secondary | ICD-10-CM | POA: Insufficient documentation

## 2020-11-17 DIAGNOSIS — G629 Polyneuropathy, unspecified: Secondary | ICD-10-CM | POA: Insufficient documentation

## 2020-11-17 DIAGNOSIS — R5383 Other fatigue: Secondary | ICD-10-CM | POA: Insufficient documentation

## 2020-11-17 DIAGNOSIS — Z95828 Presence of other vascular implants and grafts: Secondary | ICD-10-CM

## 2020-11-17 LAB — CBC WITH DIFFERENTIAL/PLATELET
Abs Immature Granulocytes: 0.01 10*3/uL (ref 0.00–0.07)
Basophils Absolute: 0 10*3/uL (ref 0.0–0.1)
Basophils Relative: 1 %
Eosinophils Absolute: 0.2 10*3/uL (ref 0.0–0.5)
Eosinophils Relative: 3 %
HCT: 39.3 % (ref 36.0–46.0)
Hemoglobin: 13.4 g/dL (ref 12.0–15.0)
Immature Granulocytes: 0 %
Lymphocytes Relative: 25 %
Lymphs Abs: 1.5 10*3/uL (ref 0.7–4.0)
MCH: 31.7 pg (ref 26.0–34.0)
MCHC: 34.1 g/dL (ref 30.0–36.0)
MCV: 92.9 fL (ref 80.0–100.0)
Monocytes Absolute: 0.5 10*3/uL (ref 0.1–1.0)
Monocytes Relative: 9 %
Neutro Abs: 3.9 10*3/uL (ref 1.7–7.7)
Neutrophils Relative %: 62 %
Platelets: 246 10*3/uL (ref 150–400)
RBC: 4.23 MIL/uL (ref 3.87–5.11)
RDW: 13.1 % (ref 11.5–15.5)
WBC: 6.1 10*3/uL (ref 4.0–10.5)
nRBC: 0 % (ref 0.0–0.2)

## 2020-11-17 LAB — COMPREHENSIVE METABOLIC PANEL
ALT: 33 U/L (ref 0–44)
AST: 30 U/L (ref 15–41)
Albumin: 3.9 g/dL (ref 3.5–5.0)
Alkaline Phosphatase: 64 U/L (ref 38–126)
Anion gap: 11 (ref 5–15)
BUN: 23 mg/dL — ABNORMAL HIGH (ref 6–20)
CO2: 25 mmol/L (ref 22–32)
Calcium: 10.2 mg/dL (ref 8.9–10.3)
Chloride: 107 mmol/L (ref 98–111)
Creatinine, Ser: 0.76 mg/dL (ref 0.44–1.00)
GFR, Estimated: >60 mL/min (ref >60)
Glucose, Bld: 109 mg/dL — ABNORMAL HIGH (ref 70–99)
Potassium: 3.8 mmol/L (ref 3.5–5.1)
Sodium: 143 mmol/L (ref 135–145)
Total Bilirubin: 0.4 mg/dL (ref 0.3–1.2)
Total Protein: 7.5 g/dL (ref 6.5–8.1)

## 2020-11-17 MED ORDER — LETROZOLE 2.5 MG PO TABS: 2.5000 mg | ORAL_TABLET | Freq: Every day | ORAL | 3 refills | Status: DC

## 2020-11-17 MED ORDER — ACETAMINOPHEN 325 MG PO TABS
650.0000 mg | ORAL_TABLET | Freq: Once | ORAL | Status: AC
  Administered 2020-11-17: 650 mg via ORAL

## 2020-11-17 MED ORDER — SODIUM CHLORIDE 0.9% FLUSH
10.0000 mL | Freq: Once | INTRAVENOUS | Status: AC
  Administered 2020-11-17: 10 mL
  Filled 2020-11-17: qty 10

## 2020-11-17 MED ORDER — SODIUM CHLORIDE 0.9 % IV SOLN
Freq: Once | INTRAVENOUS | Status: AC
  Filled 2020-11-17: qty 250

## 2020-11-17 MED ORDER — SODIUM CHLORIDE 0.9% FLUSH
10.0000 mL | INTRAVENOUS | Status: DC | PRN
  Administered 2020-11-17: 10 mL
  Filled 2020-11-17: qty 10

## 2020-11-17 MED ORDER — ACETAMINOPHEN 325 MG PO TABS
ORAL_TABLET | ORAL | Status: AC
  Filled 2020-11-17: qty 2

## 2020-11-17 MED ORDER — DIPHENHYDRAMINE HCL 25 MG PO CAPS
ORAL_CAPSULE | ORAL | Status: AC
  Filled 2020-11-17: qty 2

## 2020-11-17 MED ORDER — HEPARIN SOD (PORK) LOCK FLUSH 100 UNIT/ML IV SOLN
500.0000 [IU] | Freq: Once | INTRAVENOUS | Status: AC | PRN
  Administered 2020-11-17: 500 [IU]
  Filled 2020-11-17: qty 5

## 2020-11-17 MED ORDER — DIPHENHYDRAMINE HCL 25 MG PO CAPS
50.0000 mg | ORAL_CAPSULE | Freq: Once | ORAL | Status: AC
  Administered 2020-11-17: 50 mg via ORAL

## 2020-11-17 MED ORDER — SODIUM CHLORIDE 0.9 % IV SOLN
3.6000 mg/kg | Freq: Once | INTRAVENOUS | Status: AC
  Administered 2020-11-17: 240 mg via INTRAVENOUS
  Filled 2020-11-17: qty 8

## 2020-11-17 NOTE — Progress Notes (Deleted)
Pt. declines to stay for 30 minute post observation. States she has been doing well and no issues noted with the medication.

## 2020-11-17 NOTE — Progress Notes (Signed)
Pt. had mild tingling at port site almost at end of her infusion, rate decreased, blood return noted, and tingling went away.Pt. stayed 30 minutes for post observation. Tolerated treatment well

## 2020-11-17 NOTE — Patient Instructions (Signed)
[facility] ONCOLOGY  Discharge Instructions: Thank you for choosing [facility] to provide your oncology and hematology care.   If you have a lab appointment with the [facility], please go directly to the [facility] and check in at the registration area.   Wear comfortable clothing and clothing appropriate for easy access to any Portacath or PICC line.   We strive to give you quality time with your provider. You may need to reschedule your appointment if you arrive late (15 or more minutes).  Arriving late affects you and other patients whose appointments are after yours.  Also, if you miss three or more appointments without notifying the office, you may be dismissed from the clinic at the provider's discretion.      For prescription refill requests, have your pharmacy contact our office and allow 72 hours for refills to be completed.    Today you received the following chemotherapy and/or immunotherapy agent: Ado-Trastuzumab (Kadcyla)   To help prevent nausea and vomiting after your treatment, we encourage you to take your nausea medication as directed.  BELOW ARE SYMPTOMS THAT SHOULD BE REPORTED IMMEDIATELY: *FEVER GREATER THAN 100.4 F (38 C) OR HIGHER *CHILLS OR SWEATING *NAUSEA AND VOMITING THAT IS NOT CONTROLLED WITH YOUR NAUSEA MEDICATION *UNUSUAL SHORTNESS OF BREATH *UNUSUAL BRUISING OR BLEEDING *URINARY PROBLEMS (pain or burning when urinating, or frequent urination) *BOWEL PROBLEMS (unusual diarrhea, constipation, pain near the anus) TENDERNESS IN MOUTH AND THROAT WITH OR WITHOUT PRESENCE OF ULCERS (sore throat, sores in mouth, or a toothache) UNUSUAL RASH, SWELLING OR PAIN  UNUSUAL VAGINAL DISCHARGE OR ITCHING   Items with * indicate a potential emergency and should be followed up as soon as possible or go to the Emergency Department if any problems should occur.  Please show the CHEMOTHERAPY ALERT CARD or IMMUNOTHERAPY ALERT CARD at  check-in to the Emergency Department and triage nurse.  Should you have questions after your visit or need to cancel or reschedule your appointment, please contact [facility]  Dept: [phone]  and follow the prompts.  Office hours are 8:00 a.m. to 4:30 p.m. Monday - Friday. Please note that voicemails left after 4:00 p.m. may not be returned until the following business day.  We are closed weekends and major holidays. You have access to a nurse at all times for urgent questions. Please call the main number to the clinic Dept: [phone] and follow the prompts.   For any non-urgent questions, you may also contact your provider using MyChart. We now offer e-Visits for anyone 8 and older to request care online for non-urgent symptoms. For details visit [email or web address].   Also download the MyChart app! Go to the app store, search "MyChart", open the app, select [facility], and log in with your MyChart username and password.  Due to Covid, a mask is required upon entering the hospital/clinic. If you do not have a mask, one will be given to you upon arrival. For doctor visits, patients may have 1 support person aged 62 or older with them. For treatment visits, patients cannot have anyone with them due to current Covid guidelines and our immunocompromised population.

## 2020-11-21 ENCOUNTER — Other Ambulatory Visit: Payer: Self-pay

## 2020-11-21 ENCOUNTER — Ambulatory Visit: Payer: BC Managed Care – PPO | Attending: Surgery

## 2020-11-21 DIAGNOSIS — Z483 Aftercare following surgery for neoplasm: Secondary | ICD-10-CM | POA: Insufficient documentation

## 2020-11-21 NOTE — Therapy (Signed)
[facility] [place], [place], [address] Phone: [phone]   Fax:  [phone]  Physical Therapy Treatment  Patient Details  Name: Michelle Anthony MRN: [id number] Date of Birth: 1965/10/10 Referring Provider (PT): Dr. Erroll Luna   Encounter Date: 11/21/2020   PT End of Session - 11/21/20 0814     Visit Number 2   # unchanged due to screen only   PT Start Time 0803    PT Stop Time 0813    PT Time Calculation (min) 10 min    Activity Tolerance Patient tolerated treatment well    Behavior During Therapy [facility] for tasks assessed/performed             Past Medical History:  Diagnosis Date   Erythema nodosum    Family history of breast cancer    Family history of melanoma    Frequent UTI    Frozen shoulder    History of radiation therapy 06/23/20-07/22/20   IMRT to Right Breast, Dr. Gery Pray    STD (sexually transmitted disease)    H/O HSV II    Past Surgical History:  Procedure Laterality Date   BOTOX INJECTION     BREAST LUMPECTOMY WITH RADIOACTIVE SEED AND SENTINEL LYMPH NODE BIOPSY Right 05/17/2020   Procedure: RIGHT BREAST LUMPECTOMY WITH RADIOACTIVE SEED x5 AND SENTINEL LYMPH NODE MAPPING;  Surgeon: Erroll Luna, MD;  Location: [facility];  Service: General;  Laterality: Right;   CLOSED MANIPULATION SHOULDER     PORTACATH PLACEMENT Right 01/05/2020   Procedure: INSERTION PORT-A-CATH WITH ULTRASOUND GUIDANCE;  Surgeon: Erroll Luna, MD;  Location: [facility];  Service: General;  Laterality: Right;    There were no vitals filed for this visit.   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 It measures 2.9 cm and is located in the upper outer quadrant. It is ER/PR positive and HER2 negative with  a Ki67 of 20%.                    L-DEX FLOWSHEETS - 11/21/20 0800       L-DEX LYMPHEDEMA SCREENING   Measurement Type Unilateral    L-DEX MEASUREMENT EXTREMITY Upper Extremity    POSITION  Standing    DOMINANT SIDE Right    At Risk Side Right    BASELINE SCORE (UNILATERAL) -1.7    L-DEX SCORE (UNILATERAL) 0.1    VALUE CHANGE (UNILAT) 1.8                                    PT Long Term Goals - 06/13/20 1200       PT LONG TERM GOAL #1   Title Patient will demonstrate she has regained full shoulder ROM and function post operatively compared to baselines.    Time 6    Period Months    Status Achieved                   Plan - 11/21/20 6629     Clinical Impression Statement Pt returns for her 3 month L-Dex screen. Her change from baseline of 1.8 is WNLs so no furhter treatment is required at this time except to con every 3 month L-Dex screens  which pt is agreeable to.    PT Next Visit Plan SOZO every 3 months    Consulted and Agree with Plan of Care Patient             Patient will benefit from skilled therapeutic intervention in order to improve the following deficits and impairments:     Visit Diagnosis: Aftercare following surgery for neoplasm     Problem List Patient Active Problem List   Diagnosis Date Noted   Family history of malignant neoplasm of gastrointestinal tract 08/30/2020   Colon cancer screening 08/30/2020   Port-A-Cath in place 01/13/2020   Genetic testing 01/01/2020   Family history of breast cancer    Family history of melanoma    Malignant neoplasm of upper-outer quadrant of right breast in female, estrogen receptor positive ([place]) 12/16/2019   Abnormal mammogram 12/16/2019   Frozen shoulder 12/16/2018   Arthritis of carpometacarpal (CMC) joint of thumb 11/01/2017   Fatigue 11/01/2017    Michelle Anthony, PTA 11/21/2020, 8:15 AM  [facility] [place], [place], [address] Phone: [phone]   Fax:  [phone]  Name: Michelle Anthony MRN: [id number] Date of Birth: 1966/01/15

## 2020-12-07 ENCOUNTER — Encounter: Payer: Self-pay | Admitting: *Deleted

## 2020-12-07 NOTE — Progress Notes (Signed)
Patient Care Team: Katherina Mires, MD as PCP - General (Family Medicine) Rockwell Germany, RN as Oncology Nurse Navigator Mauro Kaufmann, RN as Oncology Nurse Navigator Erroll Luna, MD as Consulting Physician (General Surgery) Nicholas Lose, MD as Consulting Physician (Hematology and Oncology) Gery Pray, MD as Consulting Physician (Radiation Oncology)  DIAGNOSIS:    ICD-10-CM   1. Malignant neoplasm of upper-outer quadrant of right breast in female, estrogen receptor positive ([place])  C50.411    Z17.0       SUMMARY OF ONCOLOGIC HISTORY: Oncology History  Malignant neoplasm of upper-outer quadrant of right breast in female, estrogen receptor positive ([place])  12/16/2019 Initial Diagnosis   Screening mammogram  showed a right breast asymmetry. Diagnostic mammogram showed a 2.9cm mass at the 9 o'clock position with 2 adjacent masses at the 10 and 8 o'clock positions, 0.9cm and 1.4cm respectively, and an enlarged lymph node, 1.3cm.  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 The report date is 12/31/2019.   The Multi-Cancer Panel offered by [facility] includes sequencing and/or deletion duplication testing of the following 85 genes: AIP, ALK, APC, ATM, AXIN2,BAP1,  BARD1, BLM, BMPR1A, BRCA1, BRCA2, BRIP1, CASR, CDC73, CDH1, CDK4, CDKN1B, CDKN1C, CDKN2A (p14ARF), CDKN2A (p16INK4a), CEBPA, CHEK2, CTNNA1, DICER1, DIS3L2, EGFR (c.2369C>T, p.Thr790Met variant only), EPCAM (Deletion/duplication testing only), FH, FLCN, GATA2, GPC3, GREM1 (Promoter region deletion/duplication testing only), HOXB13 (c.251G>A, p.Gly84Glu), HRAS, KIT, MAX, MEN1, MET, MITF (c.952G>A, p.Glu318Lys variant only), MLH1, MSH2, MSH3, MSH6, MUTYH, NBN, NF1,  NF2, NTHL1, PALB2, PDGFRA, PHOX2B, PMS2, POLD1, POLE, POT1, PRKAR1A, PTCH1, PTEN, RAD50, RAD51C, RAD51D, RB1, RECQL4, RET, RNF43, RUNX1, SDHAF2, SDHA (sequence changes only), SDHB, SDHC, SDHD, SMAD4, SMARCA4, SMARCB1, SMARCE1, STK11, SUFU, TERC, TERT, TMEM127, TP53, TSC1, TSC2, VHL, WRN and WT1.   01/06/2020 - 04/22/2020 Chemotherapy   Neoadjuvant chemo with TCH Perjeta x6 cycles        05/17/2020 Surgery   Right lumpectomy ([facility]): scattered foci of residual invasive lobular carcinoma, 0.6-1.0cm, grade 2, clear margins, 2 right axillary lymph nodes negative for carcinoma.   06/02/2020 -  Chemotherapy    Patient is on Treatment Plan: BREAST ADO-TRASTUZUMAB EMTANSINE ([place]) Q21D       06/24/2020 - 07/22/2020 Radiation Therapy   Adj XRT   11/17/2020 -  Anti-estrogen oral therapy   Letrozole 2.5 mg daily     CHIEF COMPLIANT: Kadcyla maintenance  INTERVAL HISTORY: Michelle Anthony is a 55 y.o. with above-mentioned history of breast cancer treated with neoadjuvant chemotherapy, lumpectomy, and is currently on Kadcyla maintenance. She presents to the clinic today for treatment.   ALLERGIES:  has No Known Allergies.  MEDICATIONS:  Current Outpatient Medications  Medication Sig Dispense Refill   COLLAGEN PO Apply topically daily.     gabapentin (NEURONTIN) 300 MG capsule Take 2 capsules (600 mg total) by mouth at bedtime. 180 capsule 3   ibuprofen (ADVIL) 200 MG tablet Take 3 tablets (600 mg total) by mouth every 8 (eight) hours as needed.     letrozole (FEMARA) 2.5 MG tablet Take 1 tablet (2.5 mg total) by mouth daily. 90 tablet 3   Multiple Vitamin (MULTIVITAMIN) tablet Take 1 tablet by mouth daily.     prochlorperazine (COMPAZINE) 10 MG tablet Take  1 tablet (10 mg total) by mouth every 6 (six) hours as needed (Nausea or vomiting). 60 tablet 1   Tretinoin (RETIN-A EX) Apply topically.     No current facility-administered medications for this visit.    PHYSICAL EXAMINATION: ECOG  PERFORMANCE STATUS: 1 - Symptomatic but completely ambulatory  Vitals:   12/08/20 0910  BP: (!) 114/58  Pulse: 75  Resp: 18  Temp: 97.7 F (36.5 C)  SpO2: 99%   Filed Weights   12/08/20 0910  Weight: 141 lb 14.4 oz (64.4 kg)    LABORATORY DATA:  I have reviewed the data as listed CMP Latest Ref Rng & Units 11/17/2020 10/27/2020 10/06/2020  Glucose 70 - 99 mg/dL 109(H) 84 109(H)  BUN 6 - 20 mg/dL 23(H) 16 16  Creatinine 0.44 - 1.00 mg/dL 0.76 0.72 0.70  Sodium 135 - 145 mmol/L 143 140 141  Potassium 3.5 - 5.1 mmol/L 3.8 4.0 3.9  Chloride 98 - 111 mmol/L 107 104 104  CO2 22 - 32 mmol/L 25 29 27   Calcium 8.9 - 10.3 mg/dL 10.2 9.7 10.1  Total Protein 6.5 - 8.1 g/dL 7.5 7.6 7.6  Total Bilirubin 0.3 - 1.2 mg/dL 0.4 0.4 0.4  Alkaline Phos 38 - 126 U/L 64 68 67  AST 15 - 41 U/L 30 26 23   ALT 0 - 44 U/L 33 26 26    Lab Results  Component Value Date   WBC 4.9 12/08/2020   HGB 13.1 12/08/2020   HCT 38.3 12/08/2020   MCV 91.8 12/08/2020   PLT 219 12/08/2020   NEUTROABS 2.2 12/08/2020    ASSESSMENT & PLAN:  Malignant neoplasm of upper-outer quadrant of right breast in female, estrogen receptor positive ([place]) 12/16/2019:Screening mammogram  showed a right breast asymmetry. Diagnostic mammogram showed a 2.9cm mass at the 9 o'clock position with 2 adjacent masses at the 10 and 8 o'clock positions, 0.9cm and 1.4cm respectively, and an enlarged lymph node, 1.3cm.  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 Neoadjuvant chemotherapy with TCH Perjeta 6 cycles followed by Herceptin and Perjeta versus Kadcyla maintenance for 1 year (also could be randomized compass HER 2 trial) 2. Followed by breast conserving surgery if possible with sentinel lymph node study 3. Followed by adjuvant radiation therapy completed April 2022 4.  Followed by adjuvant  antiestrogen therapy started 11/17/2020 5.  Followed by neratinib (to be started in February 2023) SWOG S 1714 neuropathy clinical trial: No adverse effects from participating in the trial 12/31/2019: Breast MRI: Known malignancy 3.1 cm, with non-mass enhancement total measuring 4.2 cm.  Additional masses 6 mm and 3 and 4 mm MRI guided biopsy on the 2 additional masses: Invasive lobular cancer with LCIS, retroareolar: LCIS grade 2 --------------------------------------------------------------------------------------------------------------------------------------------- 05/17/20: Right lumpectomy ([facility]): scattered foci of residual invasive lobular carcinoma, 0.6-1.0cm, grade 2, clear margins, 2 right axillary lymph nodes negative for carcinoma.   Current treatment: Kadcyla Maintenance, today is cycle 9 (until September 2022) 1.  Fatigue 2. neuropathy: Mild to moderate: Patient is currently on 600 mg of gabapentin.  She would like to go on the clinical trial for neuropathy.  Once she is done with the Kadcyla we will get her on the trial.   Letrozole Toxicities: Experiencing hot flashes and joint stiffness She is exercising regularly. Port to be removed in October. RTC in 3 weeks  for last treatment of Kadcyla for her last cycle of Kadcyla.    No orders of the defined types were placed in this encounter.  The patient has a good understanding of the overall plan. she agrees with it. she will call with any problems that may develop before the next visit here.  Total time spent: 30 mins including face to face time and time spent for planning, charting and coordination of care  Rulon Eisenmenger, MD, MPH 12/08/2020  I, Thana Ates, am acting as scribe for Dr. Nicholas Lose.  I have reviewed the above documentation for accuracy and completeness, and I agree with the above.

## 2020-12-07 NOTE — Assessment & Plan Note (Signed)
12/16/2019:Screening mammogram showed a right breast asymmetry. Diagnostic mammogram showed a 2.9cm mass at the 9 o'clock position with 2 adjacent masses at the 10 and 8 o'clock positions, 0.9cm and 1.4cm respectively, and an enlarged lymph node, 1.3cm. 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 Neoadjuvant chemotherapy with TCH Perjeta 6 cycles followed by Herceptinand Perjeta versus Kadcylamaintenance for 1 year(also could be randomized compass HER 2 trial) 2. Followed by breast conserving surgery if possible with sentinel lymph node study 3. Followed by adjuvant radiation therapy completed April 2022 4.Followed by adjuvant antiestrogen therapy started 11/17/2020 5.  Followed by neratinib (to be started in February 2023) SWOG S 1714 neuropathy clinical trial: No adverse effects from participating in the trial 12/31/2019: Breast MRI: Known malignancy 3.1 cm, with non-mass enhancement total measuring 4.2 cm. Additional masses 6 mm and 3 and 4 mm MRI guided biopsy on the 2 additional masses:Invasive lobular cancer with LCIS, retroareolar: LCIS grade 2 --------------------------------------------------------------------------------------------------------------------------------------------- 05/17/20:Right lumpectomy ([facility]): scattered foci of residual invasive lobular carcinoma, 0.6-1.0cm, grade 2, clear margins, 2 right axillary lymph nodes negative for carcinoma.  Current treatment: Kadcyla Maintenance, todayiscycle9(until September 2022) 1.Fatigue 2.neuropathy: Mild to moderate  Letrozole Toxicities:  RTC in 3 weeks for last treatment of Kadcyla

## 2020-12-08 ENCOUNTER — Other Ambulatory Visit: Payer: Self-pay

## 2020-12-08 ENCOUNTER — Inpatient Hospital Stay: Payer: BC Managed Care – PPO

## 2020-12-08 ENCOUNTER — Inpatient Hospital Stay (HOSPITAL_BASED_OUTPATIENT_CLINIC_OR_DEPARTMENT_OTHER): Payer: BC Managed Care – PPO | Admitting: Hematology and Oncology

## 2020-12-08 ENCOUNTER — Ambulatory Visit: Payer: BC Managed Care – PPO

## 2020-12-08 DIAGNOSIS — C50411 Malignant neoplasm of upper-outer quadrant of right female breast: Secondary | ICD-10-CM

## 2020-12-08 DIAGNOSIS — Z95828 Presence of other vascular implants and grafts: Secondary | ICD-10-CM

## 2020-12-08 DIAGNOSIS — Z170 Estrogen receptor positive status [ER+]: Secondary | ICD-10-CM

## 2020-12-08 LAB — COMPREHENSIVE METABOLIC PANEL
ALT: 36 U/L (ref 0–44)
AST: 31 U/L (ref 15–41)
Albumin: 3.8 g/dL (ref 3.5–5.0)
Alkaline Phosphatase: 75 U/L (ref 38–126)
Anion gap: 8 (ref 5–15)
BUN: 23 mg/dL — ABNORMAL HIGH (ref 6–20)
CO2: 28 mmol/L (ref 22–32)
Calcium: 10 mg/dL (ref 8.9–10.3)
Chloride: 106 mmol/L (ref 98–111)
Creatinine, Ser: 0.77 mg/dL (ref 0.44–1.00)
GFR, Estimated: >60 mL/min (ref >60)
Glucose, Bld: 106 mg/dL — ABNORMAL HIGH (ref 70–99)
Potassium: 3.7 mmol/L (ref 3.5–5.1)
Sodium: 142 mmol/L (ref 135–145)
Total Bilirubin: 0.4 mg/dL (ref 0.3–1.2)
Total Protein: 7.4 g/dL (ref 6.5–8.1)

## 2020-12-08 LAB — CBC WITH DIFFERENTIAL/PLATELET
Abs Immature Granulocytes: 0 10*3/uL (ref 0.00–0.07)
Basophils Absolute: 0 10*3/uL (ref 0.0–0.1)
Basophils Relative: 1 %
Eosinophils Absolute: 0.2 10*3/uL (ref 0.0–0.5)
Eosinophils Relative: 4 %
HCT: 38.3 % (ref 36.0–46.0)
Hemoglobin: 13.1 g/dL (ref 12.0–15.0)
Immature Granulocytes: 0 %
Lymphocytes Relative: 40 %
Lymphs Abs: 2 10*3/uL (ref 0.7–4.0)
MCH: 31.4 pg (ref 26.0–34.0)
MCHC: 34.2 g/dL (ref 30.0–36.0)
MCV: 91.8 fL (ref 80.0–100.0)
Monocytes Absolute: 0.5 10*3/uL (ref 0.1–1.0)
Monocytes Relative: 10 %
Neutro Abs: 2.2 10*3/uL (ref 1.7–7.7)
Neutrophils Relative %: 45 %
Platelets: 219 10*3/uL (ref 150–400)
RBC: 4.17 MIL/uL (ref 3.87–5.11)
RDW: 13 % (ref 11.5–15.5)
WBC: 4.9 10*3/uL (ref 4.0–10.5)
nRBC: 0 % (ref 0.0–0.2)

## 2020-12-08 MED ORDER — SODIUM CHLORIDE 0.9% FLUSH
10.0000 mL | INTRAVENOUS | Status: DC | PRN
  Administered 2020-12-08: 10 mL

## 2020-12-08 MED ORDER — ACETAMINOPHEN 325 MG PO TABS
650.0000 mg | ORAL_TABLET | Freq: Once | ORAL | Status: AC
  Administered 2020-12-08: 650 mg via ORAL

## 2020-12-08 MED ORDER — SODIUM CHLORIDE 0.9 % IV SOLN: Freq: Once | INTRAVENOUS | Status: AC

## 2020-12-08 MED ORDER — SODIUM CHLORIDE 0.9% FLUSH
10.0000 mL | Freq: Once | INTRAVENOUS | Status: AC
  Administered 2020-12-08: 10 mL

## 2020-12-08 MED ORDER — DIPHENHYDRAMINE HCL 25 MG PO CAPS
50.0000 mg | ORAL_CAPSULE | Freq: Once | ORAL | Status: AC
  Administered 2020-12-08: 50 mg via ORAL

## 2020-12-08 MED ORDER — DIPHENHYDRAMINE HCL 25 MG PO CAPS
ORAL_CAPSULE | ORAL | Status: AC
  Filled 2020-12-08: qty 2

## 2020-12-08 MED ORDER — ACETAMINOPHEN 325 MG PO TABS
ORAL_TABLET | ORAL | Status: AC
  Filled 2020-12-08: qty 2

## 2020-12-08 MED ORDER — HEPARIN SOD (PORK) LOCK FLUSH 100 UNIT/ML IV SOLN
500.0000 [IU] | Freq: Once | INTRAVENOUS | Status: AC | PRN
  Administered 2020-12-08: 500 [IU]

## 2020-12-08 MED ORDER — SODIUM CHLORIDE 0.9 % IV SOLN
3.6000 mg/kg | Freq: Once | INTRAVENOUS | Status: AC
  Administered 2020-12-08: 240 mg via INTRAVENOUS
  Filled 2020-12-08: qty 8

## 2020-12-08 NOTE — Patient Instructions (Signed)
[facility] ONCOLOGY  Discharge Instructions: Thank you for choosing [facility] to provide your oncology and hematology care.   If you have a lab appointment with the [facility], please go directly to the [facility] and check in at the registration area.   Wear comfortable clothing and clothing appropriate for easy access to any Portacath or PICC line.   We strive to give you quality time with your provider. You may need to reschedule your appointment if you arrive late (15 or more minutes).  Arriving late affects you and other patients whose appointments are after yours.  Also, if you miss three or more appointments without notifying the office, you may be dismissed from the clinic at the provider's discretion.      For prescription refill requests, have your pharmacy contact our office and allow 72 hours for refills to be completed.    Today you received the following chemotherapy and/or immunotherapy agent: Ado-Trastuzumab (Kadcyla)   To help prevent nausea and vomiting after your treatment, we encourage you to take your nausea medication as directed.  BELOW ARE SYMPTOMS THAT SHOULD BE REPORTED IMMEDIATELY: *FEVER GREATER THAN 100.4 F (38 C) OR HIGHER *CHILLS OR SWEATING *NAUSEA AND VOMITING THAT IS NOT CONTROLLED WITH YOUR NAUSEA MEDICATION *UNUSUAL SHORTNESS OF BREATH *UNUSUAL BRUISING OR BLEEDING *URINARY PROBLEMS (pain or burning when urinating, or frequent urination) *BOWEL PROBLEMS (unusual diarrhea, constipation, pain near the anus) TENDERNESS IN MOUTH AND THROAT WITH OR WITHOUT PRESENCE OF ULCERS (sore throat, sores in mouth, or a toothache) UNUSUAL RASH, SWELLING OR PAIN  UNUSUAL VAGINAL DISCHARGE OR ITCHING   Items with * indicate a potential emergency and should be followed up as soon as possible or go to the Emergency Department if any problems should occur.  Please show the CHEMOTHERAPY ALERT CARD or IMMUNOTHERAPY ALERT CARD at  check-in to the Emergency Department and triage nurse.  Should you have questions after your visit or need to cancel or reschedule your appointment, please contact [facility]  Dept: [phone]  and follow the prompts.  Office hours are 8:00 a.m. to 4:30 p.m. Monday - Friday. Please note that voicemails left after 4:00 p.m. may not be returned until the following business day.  We are closed weekends and major holidays. You have access to a nurse at all times for urgent questions. Please call the main number to the clinic Dept: [phone] and follow the prompts.   For any non-urgent questions, you may also contact your provider using MyChart. We now offer e-Visits for anyone 75 and older to request care online for non-urgent symptoms. For details visit [email or web address].   Also download the MyChart app! Go to the app store, search "MyChart", open the app, select [facility], and log in with your MyChart username and password.  Due to Covid, a mask is required upon entering the hospital/clinic. If you do not have a mask, one will be given to you upon arrival. For doctor visits, patients may have 1 support person aged 49 or older with them. For treatment visits, patients cannot have anyone with them due to current Covid guidelines and our immunocompromised population.

## 2020-12-12 ENCOUNTER — Encounter: Payer: Self-pay | Admitting: Hematology and Oncology

## 2020-12-22 ENCOUNTER — Ambulatory Visit: Payer: BC Managed Care – PPO | Admitting: Obstetrics and Gynecology

## 2020-12-28 NOTE — Progress Notes (Signed)
Patient Care Team: Katherina Mires, MD as PCP - General (Family Medicine) Rockwell Germany, RN as Oncology Nurse Navigator Mauro Kaufmann, RN as Oncology Nurse Navigator Erroll Luna, MD as Consulting Physician (General Surgery) Nicholas Lose, MD as Consulting Physician (Hematology and Oncology) Gery Pray, MD as Consulting Physician (Radiation Oncology)  DIAGNOSIS:    ICD-10-CM   1. Malignant neoplasm of upper-outer quadrant of right breast in female, estrogen receptor positive ([place])  C50.411    Z17.0       SUMMARY OF ONCOLOGIC HISTORY: Oncology History  Malignant neoplasm of upper-outer quadrant of right breast in female, estrogen receptor positive ([place])  12/16/2019 Initial Diagnosis   Screening mammogram  showed a right breast asymmetry. Diagnostic mammogram showed a 2.9cm mass at the 9 o'clock position with 2 adjacent masses at the 10 and 8 o'clock positions, 0.9cm and 1.4cm respectively, and an enlarged lymph node, 1.3cm.  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 The report date is 12/31/2019.   The Multi-Cancer Panel offered by [facility] includes sequencing and/or deletion duplication testing of the following 85 genes: AIP, ALK, APC, ATM, AXIN2,BAP1,  BARD1, BLM, BMPR1A, BRCA1, BRCA2, BRIP1, CASR, CDC73, CDH1, CDK4, CDKN1B, CDKN1C, CDKN2A (p14ARF), CDKN2A (p16INK4a), CEBPA, CHEK2, CTNNA1, DICER1, DIS3L2, EGFR (c.2369C>T, p.Thr790Met variant only), EPCAM (Deletion/duplication testing only), FH, FLCN, GATA2, GPC3, GREM1 (Promoter region deletion/duplication testing only), HOXB13 (c.251G>A, p.Gly84Glu), HRAS, KIT, MAX, MEN1, MET, MITF (c.952G>A, p.Glu318Lys variant only), MLH1, MSH2, MSH3, MSH6, MUTYH, NBN, NF1,  NF2, NTHL1, PALB2, PDGFRA, PHOX2B, PMS2, POLD1, POLE, POT1, PRKAR1A, PTCH1, PTEN, RAD50, RAD51C, RAD51D, RB1, RECQL4, RET, RNF43, RUNX1, SDHAF2, SDHA (sequence changes only), SDHB, SDHC, SDHD, SMAD4, SMARCA4, SMARCB1, SMARCE1, STK11, SUFU, TERC, TERT, TMEM127, TP53, TSC1, TSC2, VHL, WRN and WT1.   01/06/2020 - 04/22/2020 Chemotherapy   Neoadjuvant chemo with TCH Perjeta x6 cycles        05/17/2020 Surgery   Right lumpectomy ([facility]): scattered foci of residual invasive lobular carcinoma, 0.6-1.0cm, grade 2, clear margins, 2 right axillary lymph nodes negative for carcinoma.   06/02/2020 -  Chemotherapy    Patient is on Treatment Plan: BREAST ADO-TRASTUZUMAB EMTANSINE ([address]) Q21D       06/24/2020 - 07/22/2020 Radiation Therapy   Adj XRT   11/17/2020 -  Anti-estrogen oral therapy   Letrozole 2.5 mg daily    CHIEF COMPLIANT: Kadcyla maintenance  INTERVAL HISTORY: Michelle Anthony is a 55 y.o. with above-mentioned history of breast cancer treated with neoadjuvant chemotherapy, lumpectomy, and is currently on Kadcyla maintenance. She presents to the clinic today for treatment.  She is experiencing muscle stiffness and achiness secondary to antiestrogen therapy.  The neuropathy continues to be an issue with numbness of the fingers and toes.  She is back to work and because of that she is more fatigued.  She is trying to get some exercise every day.  She does some stretching in the morning and 30 minutes of walking afternoon if the weather is good.  ALLERGIES:  has No Known Allergies.  MEDICATIONS:  Current Outpatient Medications  Medication Sig Dispense Refill   COLLAGEN PO Apply topically daily.     gabapentin (NEURONTIN) 300 MG capsule Take 2 capsules (600 mg total) by mouth at  bedtime. 180 capsule 3   ibuprofen (ADVIL) 200 MG tablet Take 3 tablets (600 mg total) by mouth every 8 (eight) hours as needed.     letrozole (FEMARA) 2.5 MG tablet Take 1 tablet (2.5 mg total) by mouth daily. 90  tablet 3   Multiple Vitamin (MULTIVITAMIN) tablet Take 1 tablet by mouth daily.     prochlorperazine (COMPAZINE) 10 MG tablet Take 1 tablet (10 mg total) by mouth every 6 (six) hours as needed (Nausea or vomiting). 60 tablet 1   Tretinoin (RETIN-A EX) Apply topically.     No current facility-administered medications for this visit.    PHYSICAL EXAMINATION: ECOG PERFORMANCE STATUS: 1 - Symptomatic but completely ambulatory  Vitals:   12/29/20 0846  BP: 124/73  Pulse: 69  Resp: 18  Temp: 97.9 F (36.6 C)  SpO2: 100%   Filed Weights   12/29/20 0846  Weight: 143 lb 8 oz (65.1 kg)    LABORATORY DATA:  I have reviewed the data as listed CMP Latest Ref Rng & Units 12/08/2020 11/17/2020 10/27/2020  Glucose 70 - 99 mg/dL 106(H) 109(H) 84  BUN 6 - 20 mg/dL 23(H) 23(H) 16  Creatinine 0.44 - 1.00 mg/dL 0.77 0.76 0.72  Sodium 135 - 145 mmol/L 142 143 140  Potassium 3.5 - 5.1 mmol/L 3.7 3.8 4.0  Chloride 98 - 111 mmol/L 106 107 104  CO2 22 - 32 mmol/L 28 25 29   Calcium 8.9 - 10.3 mg/dL 10.0 10.2 9.7  Total Protein 6.5 - 8.1 g/dL 7.4 7.5 7.6  Total Bilirubin 0.3 - 1.2 mg/dL 0.4 0.4 0.4  Alkaline Phos 38 - 126 U/L 75 64 68  AST 15 - 41 U/L 31 30 26   ALT 0 - 44 U/L 36 33 26    Lab Results  Component Value Date   WBC 4.9 12/08/2020   HGB 13.1 12/08/2020   HCT 38.3 12/08/2020   MCV 91.8 12/08/2020   PLT 219 12/08/2020   NEUTROABS 2.2 12/08/2020    ASSESSMENT & PLAN:  Malignant neoplasm of upper-outer quadrant of right breast in female, estrogen receptor positive ([place]) 12/16/2019:Screening mammogram  showed a right breast asymmetry. Diagnostic mammogram showed a 2.9cm mass at the 9 o'clock position with 2 adjacent masses at the 10 and 8 o'clock positions, 0.9cm and 1.4cm respectively, and an enlarged lymph node, 1.3cm.  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 Neoadjuvant chemotherapy with TCH Perjeta 6 cycles followed by Herceptin and Perjeta versus Kadcyla maintenance for 1 year (also could be randomized compass HER 2 trial) 2. Followed by breast conserving surgery if possible with sentinel lymph node study 3. Followed by adjuvant radiation therapy completed April 2022 4.  Followed by adjuvant antiestrogen therapy started 11/17/2020 5.  Followed by neratinib (to be started in February 2023) SWOG S 1714 neuropathy clinical trial: No adverse effects from participating in the trial 12/31/2019: Breast MRI: Known malignancy 3.1 cm, with non-mass enhancement total measuring 4.2 cm.  Additional masses 6 mm and 3 and 4 mm MRI guided biopsy on the 2 additional masses: Invasive lobular cancer with LCIS, retroareolar: LCIS grade 2 --------------------------------------------------------------------------------------------------------------------------------------------- 05/17/20: Right lumpectomy ([facility]): scattered foci of residual invasive lobular carcinoma, 0.6-1.0cm, grade 2, clear margins, 2 right axillary lymph nodes negative for carcinoma.   Current treatment: Kadcyla Maintenance, today is cycle 11 (today's last treatment)  1.  Fatigue 2. neuropathy: Mild to moderate: Patient is currently on 600 mg of gabapentin.  She would like to go on the clinical trial for neuropathy.  Once she is done with the Kadcyla we will get her on the trial.   Letrozole Toxicities: Experiencing hot flashes and joint stiffness  She is exercising regularly. I sent a message to Dr. Brantley Stage to remove the port.  Breast cancer surveillance: 12/12/2020: [facility]: Benign Return to clinic in 6 months for follow-up and after that she can be seen annually. If she decides to go on the peripheral neuropathy clinical trial then she will need to be seen more often.    No orders of the defined types were placed in this encounter.  The patient has a good understanding of  the overall plan. she agrees with it. she will call with any problems that may develop before the next visit here.  Total time spent: 30 mins including face to face time and time spent for planning, charting and coordination of care  Rulon Eisenmenger, MD, MPH 12/29/2020  I, Thana Ates, am acting as scribe for Dr. Nicholas Lose.  I have reviewed the above documentation for accuracy and completeness, and I agree with the above.

## 2020-12-29 ENCOUNTER — Inpatient Hospital Stay: Payer: BC Managed Care – PPO | Attending: Hematology and Oncology

## 2020-12-29 ENCOUNTER — Other Ambulatory Visit: Payer: Self-pay

## 2020-12-29 ENCOUNTER — Encounter: Payer: Self-pay | Admitting: *Deleted

## 2020-12-29 ENCOUNTER — Inpatient Hospital Stay (HOSPITAL_BASED_OUTPATIENT_CLINIC_OR_DEPARTMENT_OTHER): Payer: BC Managed Care – PPO | Admitting: Hematology and Oncology

## 2020-12-29 ENCOUNTER — Inpatient Hospital Stay: Payer: BC Managed Care – PPO

## 2020-12-29 DIAGNOSIS — Z79811 Long term (current) use of aromatase inhibitors: Secondary | ICD-10-CM | POA: Insufficient documentation

## 2020-12-29 DIAGNOSIS — Z5112 Encounter for antineoplastic immunotherapy: Secondary | ICD-10-CM | POA: Diagnosis not present

## 2020-12-29 DIAGNOSIS — Z170 Estrogen receptor positive status [ER+]: Secondary | ICD-10-CM | POA: Insufficient documentation

## 2020-12-29 DIAGNOSIS — Z9221 Personal history of antineoplastic chemotherapy: Secondary | ICD-10-CM | POA: Diagnosis not present

## 2020-12-29 DIAGNOSIS — Z923 Personal history of irradiation: Secondary | ICD-10-CM | POA: Diagnosis not present

## 2020-12-29 DIAGNOSIS — R232 Flushing: Secondary | ICD-10-CM | POA: Insufficient documentation

## 2020-12-29 DIAGNOSIS — C50411 Malignant neoplasm of upper-outer quadrant of right female breast: Secondary | ICD-10-CM

## 2020-12-29 DIAGNOSIS — G629 Polyneuropathy, unspecified: Secondary | ICD-10-CM | POA: Diagnosis not present

## 2020-12-29 DIAGNOSIS — Z95828 Presence of other vascular implants and grafts: Secondary | ICD-10-CM

## 2020-12-29 LAB — CBC WITH DIFFERENTIAL/PLATELET
Abs Immature Granulocytes: 0 10*3/uL (ref 0.00–0.07)
Basophils Absolute: 0 10*3/uL (ref 0.0–0.1)
Basophils Relative: 1 %
Eosinophils Absolute: 0.1 10*3/uL (ref 0.0–0.5)
Eosinophils Relative: 3 %
HCT: 38.3 % (ref 36.0–46.0)
Hemoglobin: 13 g/dL (ref 12.0–15.0)
Immature Granulocytes: 0 %
Lymphocytes Relative: 46 %
Lymphs Abs: 1.9 10*3/uL (ref 0.7–4.0)
MCH: 31 pg (ref 26.0–34.0)
MCHC: 33.9 g/dL (ref 30.0–36.0)
MCV: 91.2 fL (ref 80.0–100.0)
Monocytes Absolute: 0.4 10*3/uL (ref 0.1–1.0)
Monocytes Relative: 9 %
Neutro Abs: 1.7 10*3/uL (ref 1.7–7.7)
Neutrophils Relative %: 41 %
Platelets: 211 10*3/uL (ref 150–400)
RBC: 4.2 MIL/uL (ref 3.87–5.11)
RDW: 12.9 % (ref 11.5–15.5)
WBC: 4.1 10*3/uL (ref 4.0–10.5)
nRBC: 0 % (ref 0.0–0.2)

## 2020-12-29 LAB — COMPREHENSIVE METABOLIC PANEL
ALT: 43 U/L (ref 0–44)
AST: 35 U/L (ref 15–41)
Albumin: 4.1 g/dL (ref 3.5–5.0)
Alkaline Phosphatase: 71 U/L (ref 38–126)
Anion gap: 10 (ref 5–15)
BUN: 20 mg/dL (ref 6–20)
CO2: 28 mmol/L (ref 22–32)
Calcium: 10.2 mg/dL (ref 8.9–10.3)
Chloride: 105 mmol/L (ref 98–111)
Creatinine, Ser: 0.71 mg/dL (ref 0.44–1.00)
GFR, Estimated: >60 mL/min (ref >60)
Glucose, Bld: 89 mg/dL (ref 70–99)
Potassium: 3.5 mmol/L (ref 3.5–5.1)
Sodium: 143 mmol/L (ref 135–145)
Total Bilirubin: 0.6 mg/dL (ref 0.3–1.2)
Total Protein: 7.5 g/dL (ref 6.5–8.1)

## 2020-12-29 LAB — RESEARCH LABS

## 2020-12-29 MED ORDER — HEPARIN SOD (PORK) LOCK FLUSH 100 UNIT/ML IV SOLN
500.0000 [IU] | Freq: Once | INTRAVENOUS | Status: AC | PRN
  Administered 2020-12-29: 500 [IU]

## 2020-12-29 MED ORDER — SODIUM CHLORIDE 0.9 % IV SOLN
Freq: Once | INTRAVENOUS | Status: AC
Start: 2020-12-29 — End: 2020-12-29

## 2020-12-29 MED ORDER — SODIUM CHLORIDE 0.9 % IV SOLN
3.6000 mg/kg | Freq: Once | INTRAVENOUS | Status: AC
  Administered 2020-12-29: 240 mg via INTRAVENOUS
  Filled 2020-12-29: qty 5

## 2020-12-29 MED ORDER — ACETAMINOPHEN 325 MG PO TABS
650.0000 mg | ORAL_TABLET | Freq: Once | ORAL | Status: AC
  Administered 2020-12-29: 650 mg via ORAL
  Filled 2020-12-29: qty 2

## 2020-12-29 MED ORDER — SODIUM CHLORIDE 0.9% FLUSH
10.0000 mL | INTRAVENOUS | Status: DC | PRN
  Administered 2020-12-29: 10 mL

## 2020-12-29 MED ORDER — DIPHENHYDRAMINE HCL 25 MG PO CAPS
50.0000 mg | ORAL_CAPSULE | Freq: Once | ORAL | Status: AC
  Administered 2020-12-29: 50 mg via ORAL
  Filled 2020-12-29: qty 2

## 2020-12-29 MED ORDER — SODIUM CHLORIDE 0.9% FLUSH
10.0000 mL | Freq: Once | INTRAVENOUS | Status: AC
  Administered 2020-12-29: 10 mL

## 2020-12-29 NOTE — Patient Instructions (Signed)
[facility] MEDICAL [facility]COL[facility]  Discharge Instructions: °Thank you for choosing [facility] to provide your oncology and hematology care.  ° °If you have a lab appointment with the Cancer Center, please go directly to the Cancer Center and check in at the registration area. °  °Wear comfortable clothing and clothing appropriate for easy access to any Portacath or PICC line.  ° °We strive to give you quality time with your provider. You may need to reschedule your appointment if you arrive late (15 or more minutes).  Arriving late affects you and other patients whose appointments are after yours.  Also, if you miss three or more appointments without notifying the office, you may be dismissed from the clinic at the provider’s discretion.    °  °For prescription refill requests, have your pharmacy contact our office and allow 72 hours for refills to be completed.   ° °Today you received the following chemotherapy and/or immunotherapy agents Kadcyla    °  °To help prevent nausea and vomiting after your treatment, we encourage you to take your nausea medication as directed. ° °BELOW ARE SYMPTOMS THAT SHOULD BE REPORTED IMMEDIATELY: °*FEVER GREATER THAN 100.4 F (38 °C) OR HIGHER °*CHILLS OR SWEATING °*NAUSEA AND VOMITING THAT IS NOT CONTROLLED WITH YOUR NAUSEA MEDICATION °*UNUSUAL SHORTNESS OF BREATH °*UNUSUAL BRUISING OR BLEEDING °*URINARY PROBLEMS (pain or burning when urinating, or frequent urination) °*BOWEL PROBLEMS (unusual diarrhea, constipation, pain near the anus) °TENDERNESS IN MOUTH AND THROAT WITH OR WITHOUT PRESENCE OF ULCERS (sore throat, sores in mouth, or a toothache) °UNUSUAL RASH, SWELLING OR PAIN  °UNUSUAL VAGINAL DISCHARGE OR ITCHING  ° °Items with * indicate a potential emergency and should be followed up as soon as possible or go to the Emergency Department if any problems should occur. ° °Please show the CHEMOTHERAPY ALERT CARD or IMMUNOTHERAPY ALERT CARD at check-in to the  Emergency Department and triage nurse. ° °Should you have questions after your visit or need to cancel or reschedule your appointment, please contact  [facility] MEDICAL ONCOL[facility]  Dept: [phone]  and follow the prompts.  Office hours are 8:00 a.m. to 4:30 p.m. Monday - Friday. Please note that voicemails left after 4:00 p.m. may not be returned until the following business day.  We are closed weekends and major holidays. You have access to a nurse at all times for urgent questions. Please call the main number to the clinic Dept: [phone] and follow the prompts. ° ° °For any non-urgent questions, you may also contact your provider using MyChart. We now offer e-Visits for anyone 18 and older to request care online for non-urgent symptoms. For details visit [email or web address]. °  °Also download the MyChart app! Go to the app store, search "MyChart", open the app, select , and log in with your MyChart username and password. ° °Due to Covid, a mask is required upon entering the hospital/clinic. If you do not have a mask, one will be given to you upon arrival. For doctor visits, patients may have 1 support person aged 18 or older with them. For treatment visits, patients cannot have anyone with them due to current Covid guidelines and our immunocompromised population.  ° °

## 2020-12-29 NOTE — Research (Signed)
S1714 - A Prospective Observational Cohort Study to Develop a Predictive Model of Taxane-Induced Peripheral Neuropathy in Cancer Patients   Week 52 The patient presented to the clinic with her daughter today for her routine MD follow up appt and last Kadcyla treatment (cycle 11 today). The pt stated she is delighted to "ring the bell" since this is her last treatment.   Patient's concomitant medications were reviewed with the pt.  She reports that her neuropathy has been constant since her last visit, but she has "learned to live with it".   PROs: Questionnaires were provided to the patient after arrival to the [facility].   Collected questionnaires and checked for completeness and accuracy.  Labs: Optional research labs were collected, per protocol, along with other standard of care labs ordered by provider this morning.  Treatment Update: The pt has been receiving her Kadcyla treatment since her last study visit (week 24).  The pt will complete her last cycle of Kadcyla today.  The pt has received a total of 11 cycles of Kadcyla from 06/02/20 through 12/29/20.   Physician Assessments: CTCAE and Treatment Burden forms completed and signed by Dr. Lindi Adie.    Assessment for Interventions for CIPN: Reviewed with patient and CRFs completed.  She denies topical agents.  Pt denies taking fish oil and glutamine.  The pt does report taking a multivitamin.  The pt provided the amounts of Vitamin E (15 mg), B6 (1.7 mg), and B12 (4.8 mcg) in her multivitamin which she takes 2 pills/gummies daily.  She said that she also takes ibuprofen less than once a week (not for neuropathy symptoms).  She reports taking gabapentin 600 mg at night for her neuropathy symptoms.  The pt specifically denied the following medications:  narcotics, anti-depressants, acetaminophen,  tramadol, and duloxetine.  The pt reports using the following complementary/alternative treatment:  foot roller and ice bottle since her last visit for neuropathy  symptoms. She said that rolling her foot over the foot roller and the ice bottle has helped her neuropathy symptoms a little.   Timed Get Up and Go Test: The pt completed this test in 8.6 seconds.  History of Falls:  She denies any history of falls in the past 6 months. Neuropen Assessment: Completed per protocol by this certified research RN, and recorded by Clabe Seal, Research Coordinator. [place] Assessment: Completed per protocol by this Film/video editor, and recorded by Clabe Seal, Research officer, political party. Plan: Informed patient of week 102 phone call scheduled to be done around September 20th 2023.  Patient denied having any questions at this time.  Patient was thanked for her time and contribution to study and was encouraged to call clinic for any questions or concerns.    Brion Aliment RN, BSN, CCRP Clinical Research Nurse Lead 12/29/2020 11:33 AM

## 2020-12-29 NOTE — Assessment & Plan Note (Signed)
12/16/2019:Screening mammogram showed a right breast asymmetry. Diagnostic mammogram showed a 2.9cm mass at the 9 o'clock position with 2 adjacent masses at the 10 and 8 o'clock positions, 0.9cm and 1.4cm respectively, and an enlarged lymph node, 1.3cm. 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 Neoadjuvant chemotherapy with TCH Perjeta 6 cycles followed by Herceptinand Perjeta versus Kadcylamaintenance for 1 year(also could be randomized compass HER 2 trial) 2. Followed by breast conserving surgery if possible with sentinel lymph node study 3. Followed by adjuvant radiation therapycompleted April 2022 4.Followed by adjuvant antiestrogen therapystarted 11/17/2020 5.Followed by neratinib (to be started in February 2023) SWOG S 1714 neuropathy clinical trial: No adverse effects from participating in the trial 12/31/2019: Breast MRI: Known malignancy 3.1 cm, with non-mass enhancement total measuring 4.2 cm. Additional masses 6 mm and 3 and 4 mm MRI guided biopsy on the 2 additional masses:Invasive lobular cancer with LCIS, retroareolar: LCIS grade 2 --------------------------------------------------------------------------------------------------------------------------------------------- 05/17/20:Right lumpectomy ([facility]): scattered foci of residual invasive lobular carcinoma, 0.6-1.0cm, grade 2, clear margins, 2 right axillary lymph nodes negative for carcinoma.  Current treatment: Kadcyla Maintenance, todayiscycle11(today's last treatment) 1.Fatigue 2.neuropathy: Mild to moderate: Patient is currently on 600 mg of gabapentin.  She would like to go on the clinical trial for neuropathy.  Once she is done with the Kadcyla we will get her on the trial.  Letrozole Toxicities: Experiencing hot flashes and joint stiffness She is exercising  regularly. Port to be removed in October.  Breast cancer surveillance: 12/12/2020: [facility]: Benign Return to clinic in 6 months for follow-up and after that she can be seen annually.

## 2021-01-01 IMAGING — MG MM BREAST LOCALIZATION CLIP
6 series · 6 of 18 positions shown · non-contrast
Comparison: Previous exam(s).

CLINICAL DATA: Status post 2 MRI guided biopsies today. Earlier
biopsy-proven RIGHT breast cancer. Today's additional biopsies to
define extent of disease.

EXAM:
DIAGNOSTIC RIGHT MAMMOGRAM POST MRI BIOPSY x2

[R CC synth-2D]
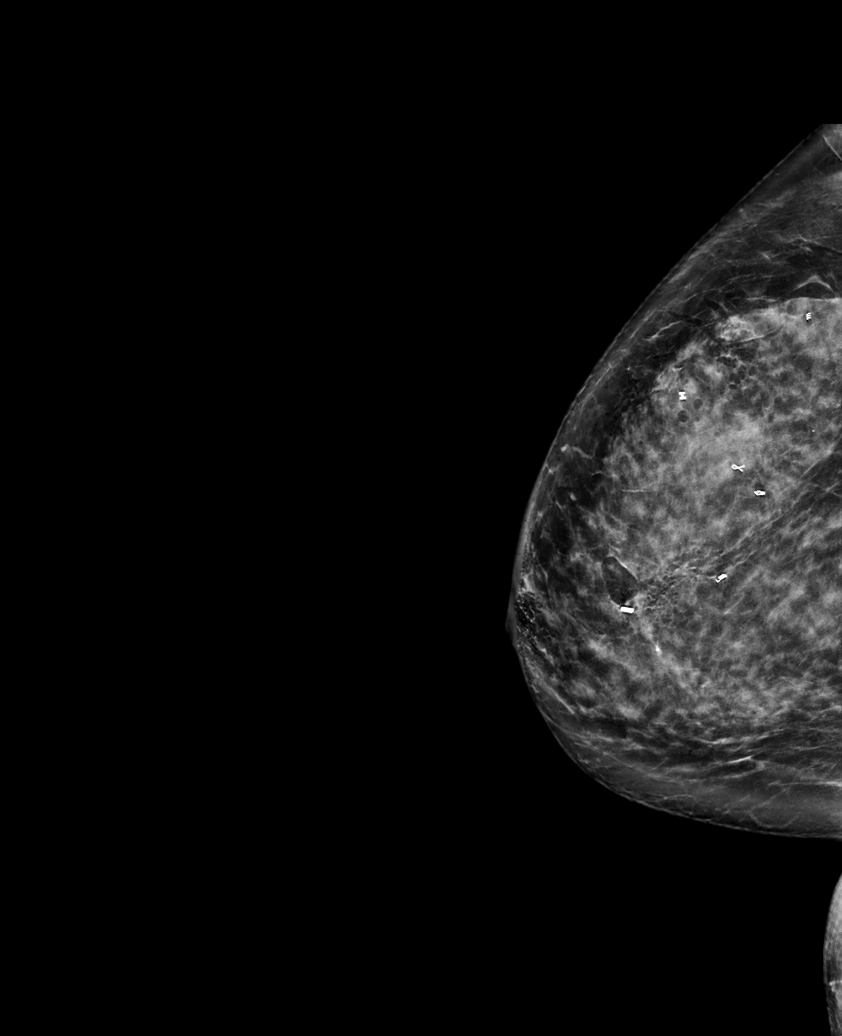

[R ML synth-2D]
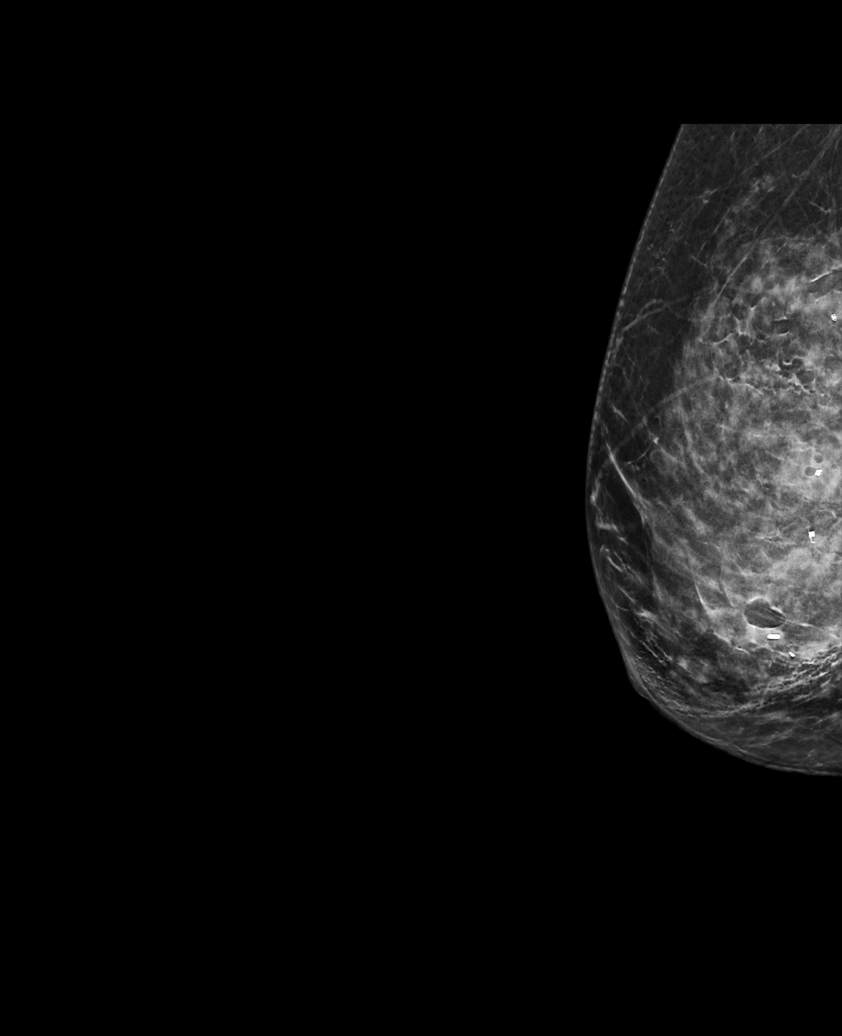

[R MLO synth-2D]
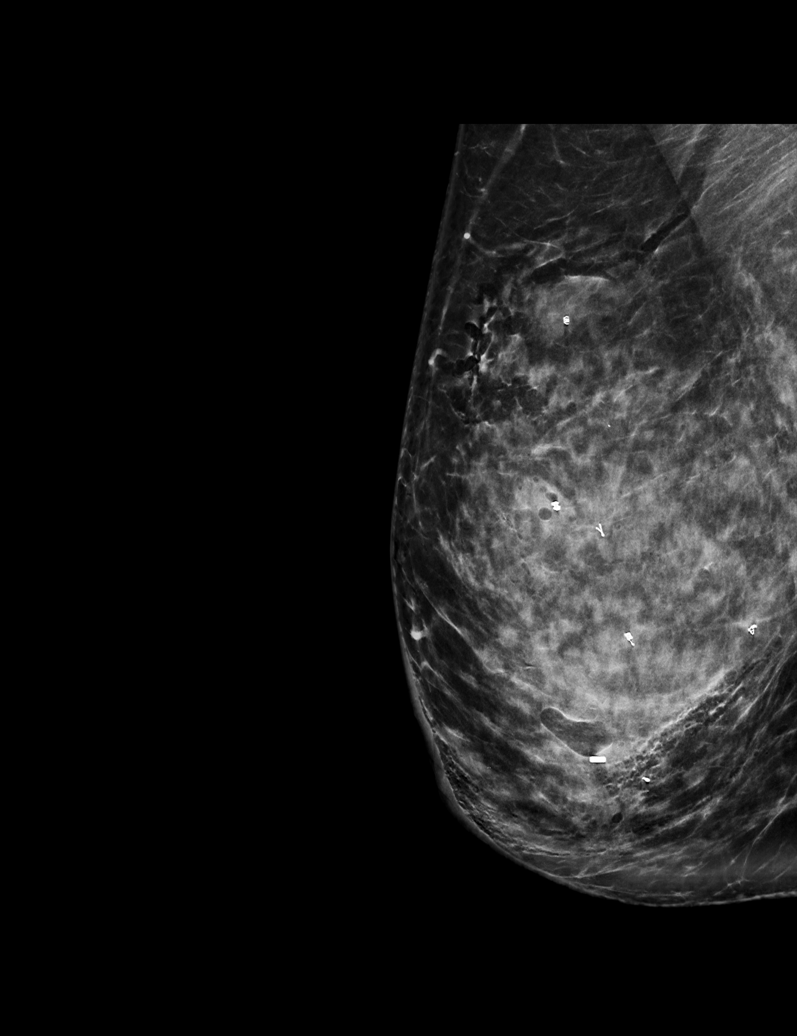

[R ML tomo · tomo slice 38/75.0]
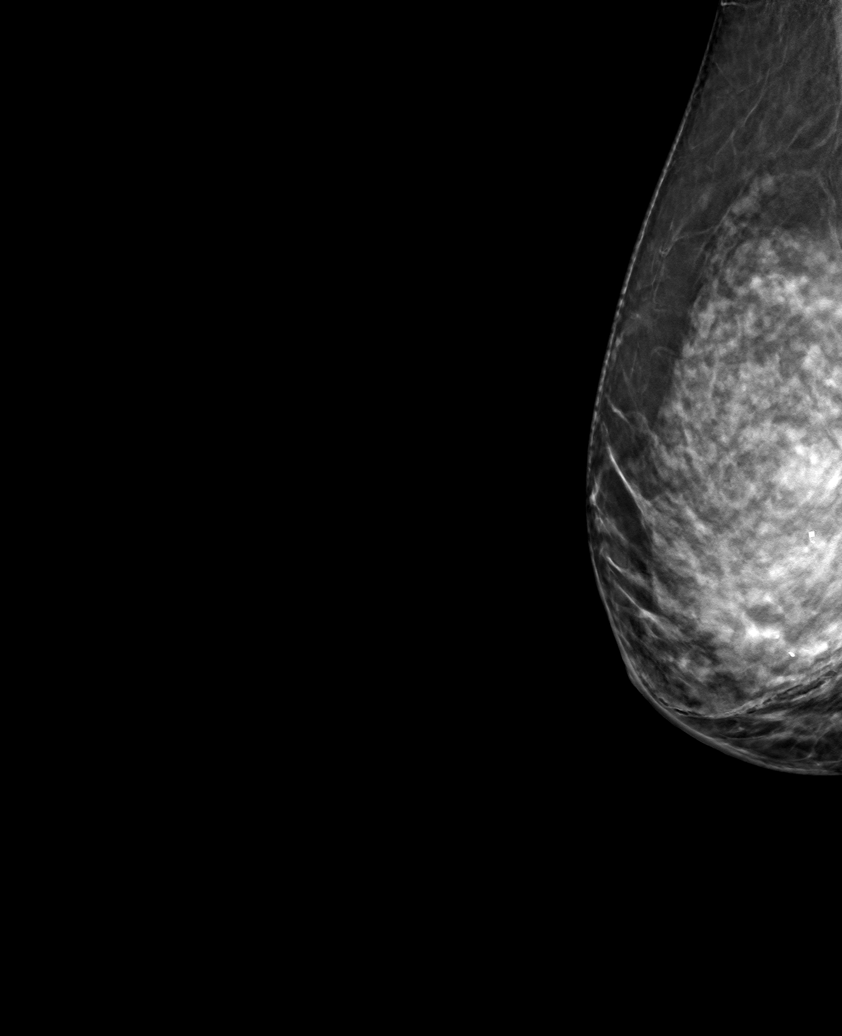

[R MLO tomo · tomo slice 37/74.0]
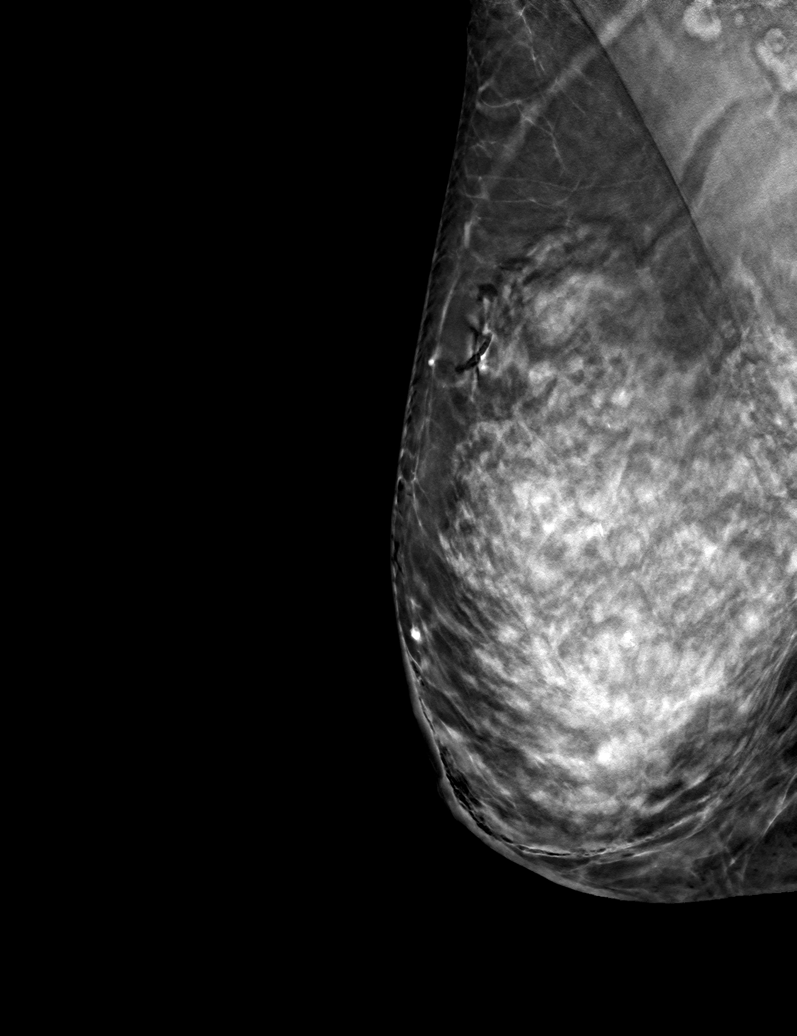

[R CC tomo · tomo slice 38/75.0]
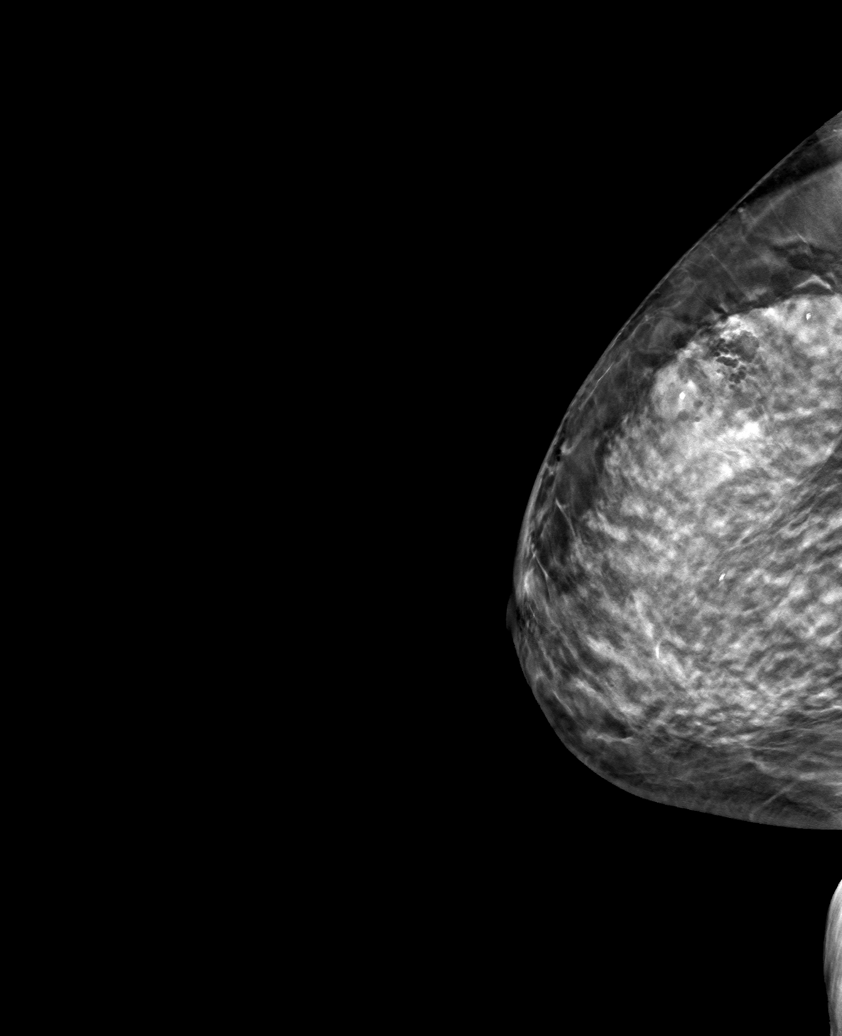

[6 of 18 positions shown; findings below may reference images not displayed]

FINDINGS: Mammographic images were obtained following MRI guided biopsy of a
suspicious mass within the upper-outer quadrant of the RIGHT breast
and a suspicious mass within the retroareolar anterior RIGHT breast.
Both biopsy marker clips are appropriately position.
IMPRESSION: 1. Appropriate positioning of the barbell shaped biopsy marking clip
at the site of biopsy in the upper-outer quadrant of the RIGHT
breast.
2. Appropriate positioning of the cylinder shaped biopsy marking
clip at the site of biopsy in the retroareolar anterior RIGHT
breast.

Final Assessment: Post Procedure Mammograms for Marker Placement

## 2021-01-01 IMAGING — MR MR BREAST BX W/ LOC DEV 1ST LEASION IMAGE BX SPEC MR GUIDE*R*
10 of 14 series · 30 of 48 positions shown · IV contrast (gadavist)
Comparison: Previous exams.
COMPARISON: Previous exams.

Addendum:
CLINICAL DATA: Known RIGHT breast cancer status post first course
of chemotherapy. MRI of 12/30/2019 described 2 additional suspicious
findings within the RIGHT breast, suspected to represent
multifocal/multicentric disease, for which patient presents today
for 2 additional MRI-guided breast biopsies.

EXAM:
MRI GUIDED CORE NEEDLE BIOPSY OF THE RIGHT BREAST
TECHNIQUE: Multiplanar, multisequence MR imaging of the RIGHT breast was
performed both before and after administration of intravenous
contrast.
CONTRAST:  6 cc of Gadavist.

[Series 2: fiducial unilateral · sagittal · 2.0mm · 1.33mm/px · 1 of 52 slices shown]
[im 1/52]
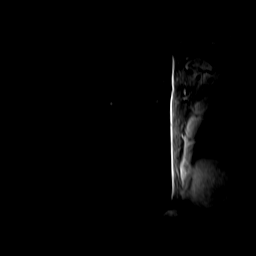

[Series 3: dynamic pre · axial · non-contrast · 1.3mm · 0.73mm/px · z∈[-95,+133]mm · 3 of 176 slices shown]
[im 1/176]
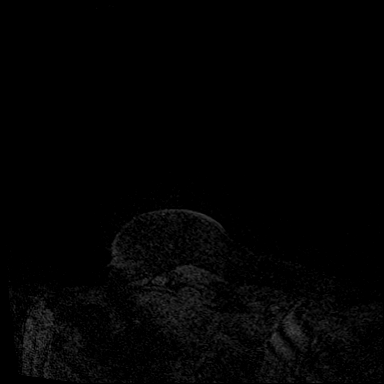
[im 88/176]
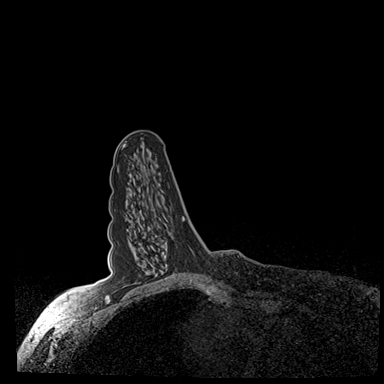
[im 176/176]
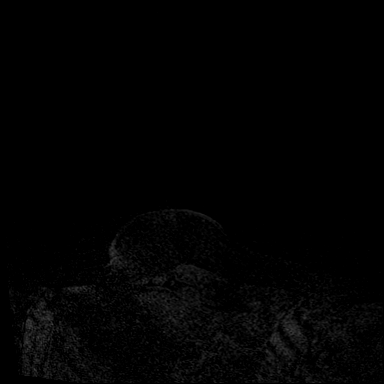

[Series 4: dynamic post 20 · axial · 1.3mm · 0.73mm/px · z∈[-95,+133]mm · 3 of 176 slices shown (1 of 2)]
[im 1/176]
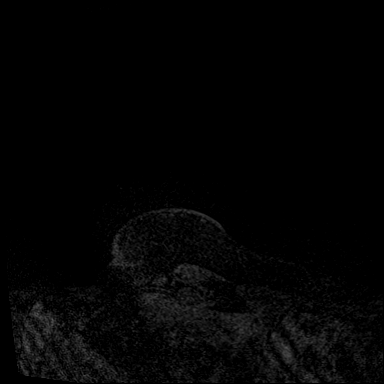
[im 88/176]
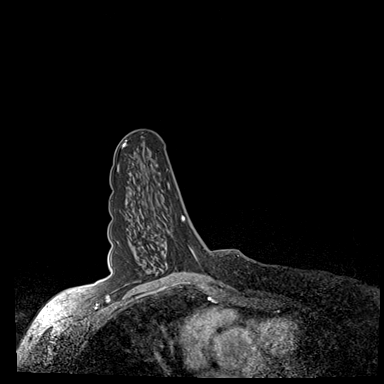
[im 176/176]
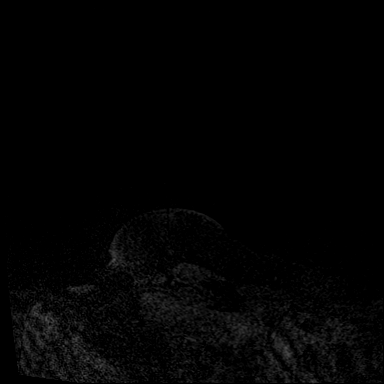

[Series 5: dynamic post 20 · axial · 1.3mm · 0.73mm/px · z∈[-95,+133]mm · 3 of 176 slices shown (2 of 2)]
[im 1/176]
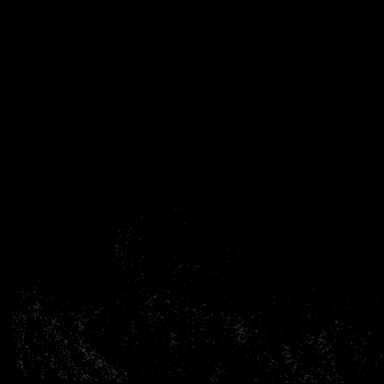
[im 88/176]
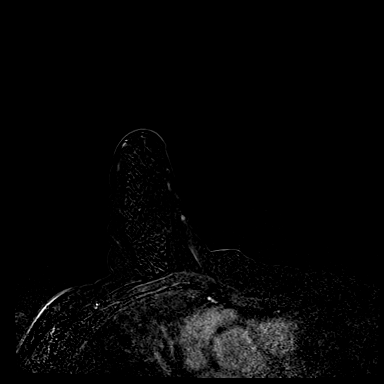
[im 176/176]
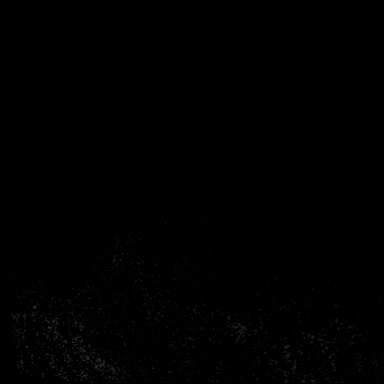

[Series 6: dynamic post 3 · axial · 1.3mm · 0.73mm/px · z∈[-95,+133]mm · 3 of 176 slices shown (1 of 2)]
[im 1/176]
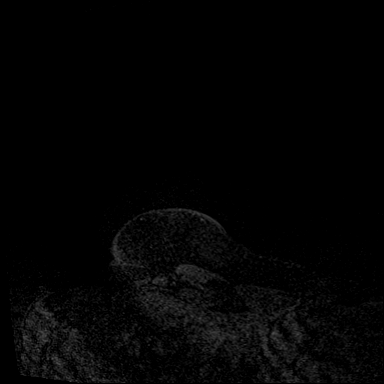
[im 88/176]
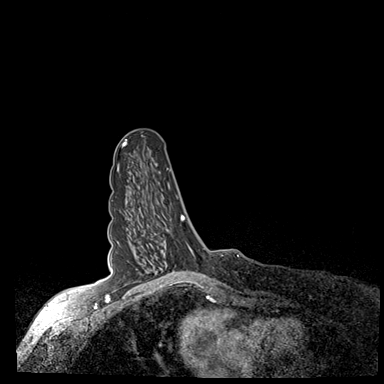
[im 176/176]
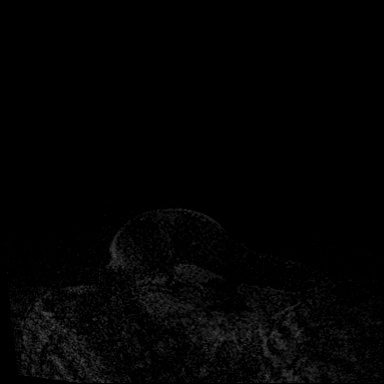

[Series 7: dynamic post 3 · axial · 1.3mm · 0.73mm/px · z∈[-95,+133]mm · 3 of 176 slices shown (2 of 2)]
[im 1/176]
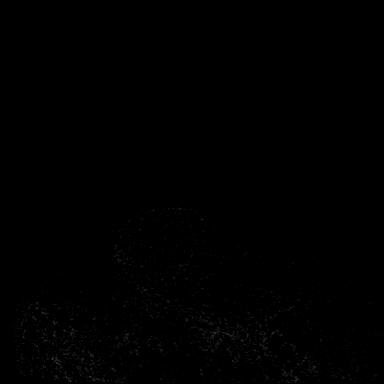
[im 88/176]
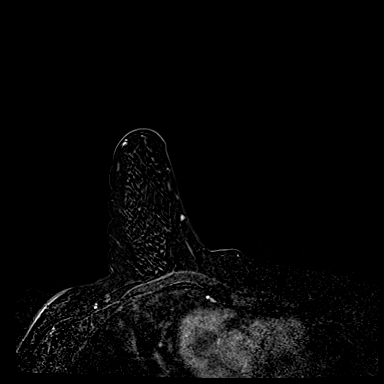
[im 176/176]
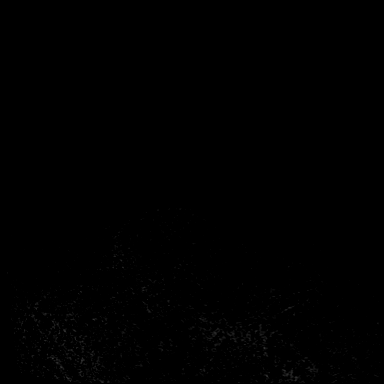

[Series 8: needle confirmation · axial · 1.3mm · 0.73mm/px · z∈[-95,+133]mm · 4 of 176 slices shown]
[im 1/176]
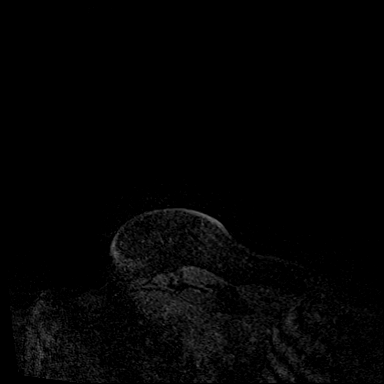
[im 59/176]
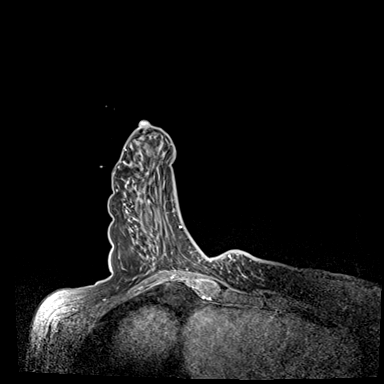
[im 117/176]
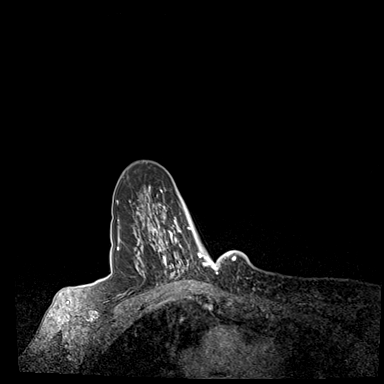
[im 176/176]
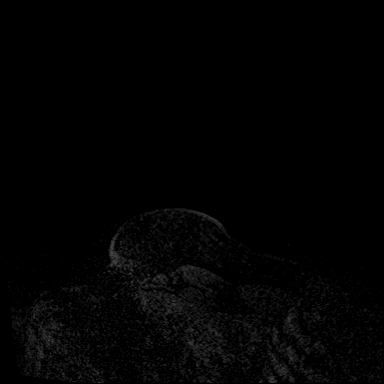

[Series 9: needle confirmation_sub · axial · 1.3mm · 0.73mm/px · z∈[-95,+133]mm · 4 of 176 slices shown]
[im 1/176]
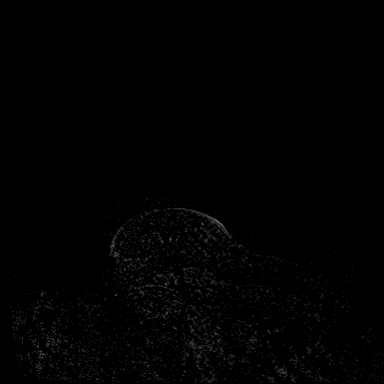
[im 59/176]
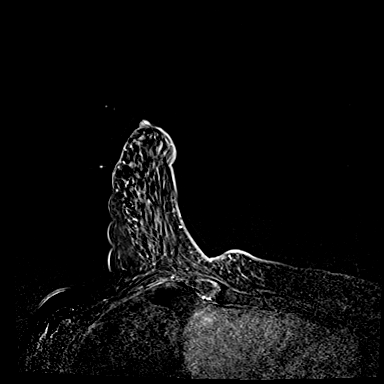
[im 117/176]
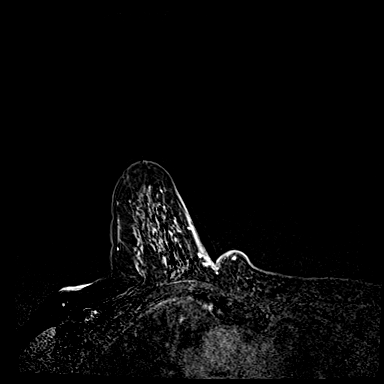
[im 176/176]
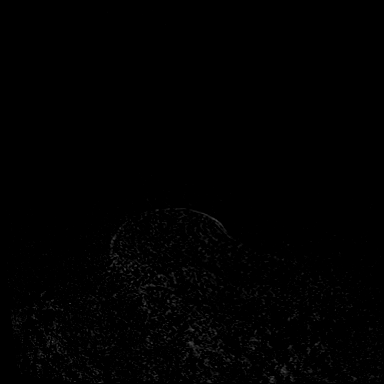

[Series 10: post bx · axial · 1.3mm · 0.73mm/px · z∈[-95,+133]mm · 4 of 176 slices shown]
[im 1/176]
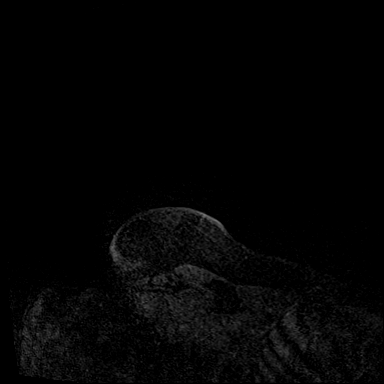
[im 59/176]
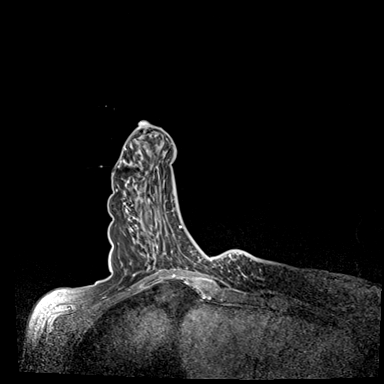
[im 117/176]
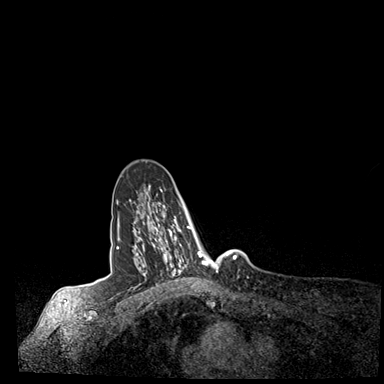
[im 176/176]
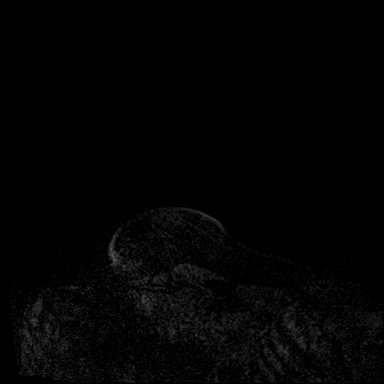

[Series 11: post bx_sub · axial · 1.3mm · 0.73mm/px · z∈[-95,-19]mm · 2 of 176 slices shown]
[im 1/176]
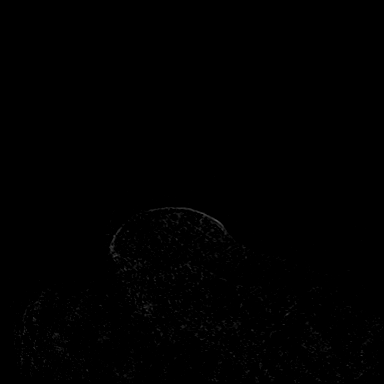
[im 59/176]
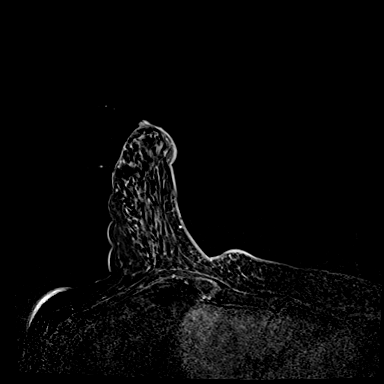

[30 of 48 positions shown; findings below may reference images not displayed]

FINDINGS: I met with the patient, and we discussed the procedure of MRI guided
biopsy, including risks, benefits, and alternatives. Specifically,
we discussed the risks of infection, bleeding, tissue injury, clip
migration, and inadequate sampling. Informed, written consent was
given. The usual time out protocol was performed immediately prior
to the procedure.

Site 1: Using sterile technique, 1% Lidocaine, MRI guidance, and a 9
gauge vacuum assisted device, biopsy was performed of the mass
within the upper-outer RIGHT breast using a lateral approach. At the
conclusion of the procedure, a barbell shaped tissue marker clip was
deployed into the biopsy cavity.

Site 2: Using sterile technique, 1% Lidocaine, MRI guidance, and a 9
gauge vacuum assisted device, biopsy was performed of the mass
within the retroareolar anterior RIGHT breast using a lateral
approach. At the conclusion of the procedure, a cylinder shaped
tissue marker clip was deployed into the biopsy cavity. Follow-up
2-view mammogram was performed and dictated separately.
IMPRESSION: 1. MRI guided biopsy of the mass within the upper-outer quadrant of
the RIGHT breast. No apparent complications.
2. MRI guided biopsy of the mass within the retroareolar anterior
RIGHT breast. No apparent complications.

ADDENDUM:
Pathology revealed GRADE II INVASIVE AND IN SITU LOBULAR CARCINOMA
of the Right breast, upper outer. This was found to be concordant by
Dr. Shalley Jim.

Pathology revealed LOBULAR CARCINOMA IN SITU of the Right breast,
retroareolar anterior. This was found to be concordant by Dr. Zauner
Lorant, with excision recommended.

Pathology results were discussed with the patient by telephone. The
patient reported doing well after the biopsies with tenderness and
minimal bleeding at the sites. Post biopsy instructions and care
were reviewed and questions were answered. The patient was
encouraged to call The [REDACTED] for any
additional concerns. My direct phone number was provided.

The patient has a recent diagnosis of Right breast cancer and should
follow her outlined treatment plan.

Dr. Leontios Konikkos, Dr. Eddinson Angel and Dr. Giorgi Jumper were
notified of biopsy results on January 19, 2020.

Pathology results reported by Dorothee Aro, RN on 01/19/2020.

*** End of Addendum ***
FINDINGS: I met with the patient, and we discussed the procedure of MRI guided
biopsy, including risks, benefits, and alternatives. Specifically,
we discussed the risks of infection, bleeding, tissue injury, clip
migration, and inadequate sampling. Informed, written consent was
given. The usual time out protocol was performed immediately prior
to the procedure.

Site 1: Using sterile technique, 1% Lidocaine, MRI guidance, and a 9
gauge vacuum assisted device, biopsy was performed of the mass
within the upper-outer RIGHT breast using a lateral approach. At the
conclusion of the procedure, a barbell shaped tissue marker clip was
deployed into the biopsy cavity.

Site 2: Using sterile technique, 1% Lidocaine, MRI guidance, and a 9
gauge vacuum assisted device, biopsy was performed of the mass
within the retroareolar anterior RIGHT breast using a lateral
approach. At the conclusion of the procedure, a cylinder shaped
tissue marker clip was deployed into the biopsy cavity. Follow-up
2-view mammogram was performed and dictated separately.
IMPRESSION: 1. MRI guided biopsy of the mass within the upper-outer quadrant of
the RIGHT breast. No apparent complications.
2. MRI guided biopsy of the mass within the retroareolar anterior
RIGHT breast. No apparent complications.

## 2021-01-17 ENCOUNTER — Telehealth: Payer: Self-pay | Admitting: Adult Health

## 2021-01-17 NOTE — Telephone Encounter (Signed)
Called and [facility] for patient to discuss getting her in for a survivorship care plan visit.  Cal back number given, [facility] sent.    Wilber Bihari, NP

## 2021-02-27 ENCOUNTER — Other Ambulatory Visit: Payer: Self-pay

## 2021-02-27 ENCOUNTER — Ambulatory Visit: Payer: BC Managed Care – PPO | Attending: Surgery

## 2021-02-27 VITALS — Wt 147.0 lb

## 2021-02-27 DIAGNOSIS — Z483 Aftercare following surgery for neoplasm: Secondary | ICD-10-CM | POA: Insufficient documentation

## 2021-02-27 NOTE — Therapy (Signed)
[place] @ [employer] [place], [place], [address] Phone: [phone]   Fax:  [phone]  Physical Therapy Treatment  Patient Details  Name: Michelle Anthony MRN: [id number] Date of Birth: Jan 17, 1966 Referring Provider (PT): Dr. Erroll Luna   Encounter Date: 02/27/2021   PT End of Session - 02/27/21 0818     Visit Number 2   # unchanged due to screen only   PT Start Time 0814    PT Stop Time 0821    PT Time Calculation (min) 7 min    Activity Tolerance Patient tolerated treatment well    Behavior During Therapy [facility] for tasks assessed/performed             Past Medical History:  Diagnosis Date   Erythema nodosum    Family history of breast cancer    Family history of melanoma    Frequent UTI    Frozen shoulder    History of radiation therapy 06/23/20-07/22/20   IMRT to Right Breast, Dr. Gery Pray    STD (sexually transmitted disease)    H/O HSV II    Past Surgical History:  Procedure Laterality Date   BOTOX INJECTION     BREAST LUMPECTOMY WITH RADIOACTIVE SEED AND SENTINEL LYMPH NODE BIOPSY Right 05/17/2020   Procedure: RIGHT BREAST LUMPECTOMY WITH RADIOACTIVE SEED x5 AND SENTINEL LYMPH NODE MAPPING;  Surgeon: Erroll Luna, MD;  Location: [place];  Service: General;  Laterality: Right;   CLOSED MANIPULATION SHOULDER     PORTACATH PLACEMENT Right 01/05/2020   Procedure: INSERTION PORT-A-CATH WITH ULTRASOUND GUIDANCE;  Surgeon: Erroll Luna, MD;  Location: [place];  Service: General;  Laterality: Right;    Vitals:   02/27/21 0816  Weight: 147 lb (66.7 kg)     Subjective Assessment - 02/27/21 0816     Subjective Pt returns for her 3 month L-Dex screen.    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 It measures 2.9 cm and is located in the upper outer quadrant. It is ER/PR  positive and HER2 negative with a Ki67 of 20%.                    L-DEX FLOWSHEETS - 02/27/21 0800       L-DEX LYMPHEDEMA SCREENING   Measurement Type Unilateral    L-DEX MEASUREMENT EXTREMITY Upper Extremity    POSITION  Standing    DOMINANT SIDE Right    At Risk Side Right    BASELINE SCORE (UNILATERAL) -1.7    L-DEX SCORE (UNILATERAL) -0.3    VALUE CHANGE (UNILAT) 1.4                                     PT Long Term Goals - 06/13/20 1200       PT LONG TERM GOAL #1   Title Patient will demonstrate she has regained full shoulder ROM and function post operatively compared to baselines.    Time 6    Period Months    Status Achieved                   Plan - 02/27/21 0825     Clinical Impression Statement Pt returns for her 3 month L-Dex screen. her change from baseline of 1.4 is WNLs so no further treatment is  required at this time exceot to cont every 3 month L-Dex screens which pt is agreeable to.    PT Next Visit Plan Cont L-Dex screens for up to 2 years from her SLNB (05/17/22)    Consulted and Agree with Plan of Care Patient             Patient will benefit from skilled therapeutic intervention in order to improve the following deficits and impairments:     Visit Diagnosis: Aftercare following surgery for neoplasm     Problem List Patient Active Problem List   Diagnosis Date Noted   Family history of malignant neoplasm of gastrointestinal tract 08/30/2020   Colon cancer screening 08/30/2020   Port-A-Cath in place 01/13/2020   Genetic testing 01/01/2020   Family history of breast cancer    Family history of melanoma    Malignant neoplasm of upper-outer quadrant of right breast in female, estrogen receptor positive ([place]) 12/16/2019   Abnormal mammogram 12/16/2019   Frozen shoulder 12/16/2018   Arthritis of carpometacarpal (CMC) joint of thumb 11/01/2017   Fatigue 11/01/2017    [name],  PTA 02/27/2021, 8:27 AM  [place] @ [place], [place], [address] Phone: [phone]   Fax:  [phone]  Name: Michelle Anthony MRN: [id number] Date of Birth: 09-11-1965

## 2021-03-21 ENCOUNTER — Telehealth: Payer: Self-pay | Admitting: *Deleted

## 2021-03-21 NOTE — Telephone Encounter (Signed)
[id number] - TREATMENT OF ESTABLISHED CHEMOTHERAPY-INDUCED NEUROPATHY WITH N-PALMITOYLETHANOLAMIDE, A CANNABIMIMETIC NUTRACEUTICAL: A RANDOMIZED DOUBLE-BLIND PHASE II PILOT TRIAL   The research nurse called the pt to inform her that it has almost been 3 months since she completed her neurotoxic chemotherapy on 12/29/20.  The pt said that she is still experiencing neuropathy, and she is still taking her gabapentin.  The pt knows that if she participates in this study, then she would have to stop her gabapentin for a week prior to enrollment. The nurse and the pt also discussed the randomization process, and the pt is aware that she may receive a placebo in this study.  The pt was told that if she returns her study drug at the end of the study, and if she completes all of her weekly questionnaires then the research nurse can request a non-urgent study drug unblinding at the end of the 8 weeks. The pt said that she is back at work now, and she needs to decide if she wants to "take on" another clinical trial.  She is aware that this supplement, N-Palmitoylethanolanide, is available commercially.  The pt was told that she would need to have labs drawn and to see Dr. Lindi Adie for an updated H/P if she consents to participate in this study.  The pt thanked the nurse for contacting her about the study.  The pt said that she will consider the study.  The pt also said that she is worried about stopping her gabapentin for study enrollment.  She doesn't want her neuropathic pain to get worse.  The pt knows to call the research nurse for any questions about the study.  The pt said that she wants to research the study drug, PEA, further before she makes up her mind about study participation.  Brion Aliment RN, BSN, CCRP Clinical Research Nurse Lead 03/21/2021 3:35 PM

## 2021-04-28 ENCOUNTER — Telehealth: Payer: Self-pay | Admitting: *Deleted

## 2021-05-29 ENCOUNTER — Ambulatory Visit: Payer: BC Managed Care – PPO

## 2021-05-31 NOTE — Progress Notes (Signed)
Patient Care Team: Katherina Mires, MD as PCP - General (Family Medicine) Rockwell Germany, RN as Oncology Nurse Navigator Mauro Kaufmann, RN as Oncology Nurse Navigator Erroll Luna, MD as Consulting Physician (General Surgery) Nicholas Lose, MD as Consulting Physician (Hematology and Oncology) Gery Pray, MD as Consulting Physician (Radiation Oncology)  DIAGNOSIS:    ICD-10-CM   1. Malignant neoplasm of upper-outer quadrant of right breast in female, estrogen receptor positive ([place])  C50.411    Z17.0       SUMMARY OF ONCOLOGIC HISTORY: Oncology History  Malignant neoplasm of upper-outer quadrant of right breast in female, estrogen receptor positive ([place])  12/16/2019 Initial Diagnosis   Screening mammogram  showed a right breast asymmetry. Diagnostic mammogram showed a 2.9cm mass at the 9 o'clock position with 2 adjacent masses at the 10 and 8 o'clock positions, 0.9cm and 1.4cm respectively, and an enlarged lymph node, 1.3cm.  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 The report date is 12/31/2019.   The Multi-Cancer Panel offered by [facility] includes sequencing and/or deletion duplication testing of the following 85 genes: AIP, ALK, APC, ATM, AXIN2,BAP1,  BARD1, BLM, BMPR1A, BRCA1, BRCA2, BRIP1, CASR, CDC73, CDH1, CDK4, CDKN1B, CDKN1C, CDKN2A (p14ARF), CDKN2A (p16INK4a), CEBPA, CHEK2, CTNNA1, DICER1, DIS3L2, EGFR (c.2369C>T, p.Thr790Met variant only), EPCAM (Deletion/duplication testing only), FH, FLCN, GATA2, GPC3, GREM1 (Promoter region deletion/duplication testing only), HOXB13 (c.251G>A, p.Gly84Glu), HRAS, KIT, MAX, MEN1, MET, MITF (c.952G>A, p.Glu318Lys variant only), MLH1, MSH2, MSH3, MSH6, MUTYH, NBN, NF1,  NF2, NTHL1, PALB2, PDGFRA, PHOX2B, PMS2, POLD1, POLE, POT1, PRKAR1A, PTCH1, PTEN, RAD50, RAD51C, RAD51D, RB1, RECQL4, RET, RNF43, RUNX1, SDHAF2, SDHA (sequence changes only), SDHB, SDHC, SDHD, SMAD4, SMARCA4, SMARCB1, SMARCE1, STK11, SUFU, TERC, TERT, TMEM127, TP53, TSC1, TSC2, VHL, WRN and WT1.   01/06/2020 - 04/22/2020 Chemotherapy   Neoadjuvant chemo with TCH Perjeta x6 cycles        05/17/2020 Surgery   Right lumpectomy ([facility]): scattered foci of residual invasive lobular carcinoma, 0.6-1.0cm, grade 2, clear margins, 2 right axillary lymph nodes negative for carcinoma.   06/02/2020 -  Chemotherapy    Patient is on Treatment Plan: BREAST ADO-TRASTUZUMAB EMTANSINE ([place]) Q21D       06/24/2020 - 07/22/2020 Radiation Therapy   Adj XRT   11/17/2020 -  Anti-estrogen oral therapy   Letrozole 2.5 mg daily     CHIEF COMPLIANT: Follow-up of breast cancer  INTERVAL HISTORY: Michelle Anthony is a 56 y.o. with above-mentioned history of breast cancer treated with neoadjuvant chemotherapy, lumpectomy, and Kadcyla maintenance. She presents to the clinic today for follow-up.  Diffuse musculoskeletal aches and pains especially in the right arm along with her hands.  It is giving her a lot of pain and discomfort.  ALLERGIES:  has No Known Allergies.  MEDICATIONS:  Current Outpatient Medications  Medication Sig Dispense Refill   COLLAGEN PO Apply topically daily.     gabapentin (NEURONTIN) 300 MG capsule Take 2 capsules (600 mg total) by mouth at bedtime. 180 capsule 3   ibuprofen (ADVIL) 200 MG tablet Take 3 tablets (600 mg total) by mouth every 8 (eight) hours as needed.     letrozole (FEMARA) 2.5 MG tablet Take 1 tablet (2.5 mg total) by mouth daily. [place]  tablet 3   Multiple Vitamin (MULTIVITAMIN) tablet Take 1 tablet by mouth daily.     prochlorperazine (COMPAZINE) 10 MG tablet Take 1 tablet (10 mg total) by mouth every 6 (six) hours as needed (Nausea or vomiting). 60 tablet 1   Tretinoin  (RETIN-A EX) Apply topically.     No current facility-administered medications for this visit.    PHYSICAL EXAMINATION: ECOG PERFORMANCE STATUS: 1 - Symptomatic but completely ambulatory  Vitals:   06/01/21 0909  BP: (!) 144/87  Pulse: 79  Resp: 18  Temp: 97.9 F (36.6 C)  SpO2: 98%   Filed Weights   06/01/21 0909  Weight: 150 lb 11.2 oz (68.4 kg)    BREAST: No palpable masses or nodules in either right or left breasts. No palpable axillary supraclavicular or infraclavicular adenopathy no breast tenderness or nipple discharge. (exam performed in the presence of a chaperone)  LABORATORY DATA:  I have reviewed the data as listed CMP Latest Ref Rng & Units 12/29/2020 12/08/2020 11/17/2020  Glucose 70 - 99 mg/dL 89 106(H) 109(H)  BUN 6 - 20 mg/dL 20 23(H) 23(H)  Creatinine 0.44 - 1.00 mg/dL 0.71 0.77 0.76  Sodium 135 - 145 mmol/L 143 142 143  Potassium 3.5 - 5.1 mmol/L 3.5 3.7 3.8  Chloride 98 - 111 mmol/L 105 106 107  CO2 22 - 32 mmol/L 28 28 25   Calcium 8.9 - 10.3 mg/dL 10.2 10.0 10.2  Total Protein 6.5 - 8.1 g/dL 7.5 7.4 7.5  Total Bilirubin 0.3 - 1.2 mg/dL 0.6 0.4 0.4  Alkaline Phos 38 - 126 U/L 71 75 64  AST 15 - 41 U/L 35 31 30  ALT 0 - 44 U/L 43 36 33    Lab Results  Component Value Date   WBC 4.1 12/29/2020   HGB 13.0 12/29/2020   HCT 38.3 12/29/2020   MCV 91.2 12/29/2020   PLT 211 12/29/2020   NEUTROABS 1.7 12/29/2020    ASSESSMENT & PLAN:  Malignant neoplasm of upper-outer quadrant of right breast in female, estrogen receptor positive ([place]) 12/16/2019:Screening mammogram  showed a right breast asymmetry. Diagnostic mammogram showed a 2.9cm mass at the 9 o'clock position with 2 adjacent masses at the 10 and 8 o'clock positions, 0.9cm and 1.4cm respectively, and an enlarged lymph node, 1.3cm.  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 Neoadjuvant chemotherapy with TCH Perjeta 6 cycles followed by Herceptin and Perjeta versus Kadcyla maintenance for 1 year completed 12/29/2020 2. 05/17/20: Right lumpectomy ([facility]): scattered foci of residual invasive lobular carcinoma, 0.6-1.0cm, grade 2, clear margins, 2 right axillary lymph nodes negative for carcinoma. 3. Adjuvant radiation therapy completed April 2022 4.  Adjuvant antiestrogen therapy started 11/17/2020 5.  Followed by neratinib (to be started in February 2023) SWOG S 1714 neuropathy clinical trial: No adverse effects from participating in the trial ---------------------------------------------------------------------------------------------------------------------------------------------  Letrozole Toxicities: Experiencing hot flashes and joint stiffness Because of the severity of symptoms from the letrozole, I recommended that we stop letrozole and switch her to anastrozole.  Neratinib counseling: We discussed pros and cons of neratinib therapy.She is exercising regularly.  We will wait on neratinib until we know that she can handle the anastrozole well.    Breast cancer surveillance: 12/12/2020: Michelle Anthony: Benign   6 weeks telephone visit to discuss tolerance to anastrozole and at that time we can  start her on the neratinib if she is doing well. She has a major meeting tonight at [facility] and is concerned about COVID risk.  She will wear a mask.  No orders of the defined types were placed in this encounter.  The patient has a good understanding of the overall plan. she agrees with it. she will call with any problems that may develop before the next visit here.  Total time spent: 30 mins including face to face time and time spent for planning, charting and coordination of care  Rulon Eisenmenger, MD, MPH 06/01/2021  I, Thana Ates, am acting as scribe for Dr. Nicholas Lose.  I have reviewed the above documentation for accuracy and completeness, and I  agree with the above.

## 2021-06-01 ENCOUNTER — Inpatient Hospital Stay: Payer: BC Managed Care – PPO | Attending: Hematology and Oncology | Admitting: Hematology and Oncology

## 2021-06-01 ENCOUNTER — Other Ambulatory Visit: Payer: Self-pay

## 2021-06-01 DIAGNOSIS — Z9221 Personal history of antineoplastic chemotherapy: Secondary | ICD-10-CM | POA: Insufficient documentation

## 2021-06-01 DIAGNOSIS — Z170 Estrogen receptor positive status [ER+]: Secondary | ICD-10-CM | POA: Insufficient documentation

## 2021-06-01 DIAGNOSIS — C50411 Malignant neoplasm of upper-outer quadrant of right female breast: Secondary | ICD-10-CM | POA: Insufficient documentation

## 2021-06-01 DIAGNOSIS — Z923 Personal history of irradiation: Secondary | ICD-10-CM | POA: Diagnosis not present

## 2021-06-01 DIAGNOSIS — Z79811 Long term (current) use of aromatase inhibitors: Secondary | ICD-10-CM | POA: Diagnosis not present

## 2021-06-01 DIAGNOSIS — G629 Polyneuropathy, unspecified: Secondary | ICD-10-CM | POA: Insufficient documentation

## 2021-06-01 MED ORDER — ANASTROZOLE 1 MG PO TABS: 1.0000 mg | ORAL_TABLET | Freq: Every day | ORAL | 3 refills | Status: DC

## 2021-06-01 NOTE — Assessment & Plan Note (Signed)
12/16/2019:Screening mammogram showed a right breast asymmetry. Diagnostic mammogram showed a 2.9cm mass at the 9 o'clock position with 2 adjacent masses at the 10 and 8 o'clock positions, 0.9cm and 1.4cm respectively, and an enlarged lymph node, 1.3cm. 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 Neoadjuvant chemotherapy with TCH Perjeta 6 cycles followed by Herceptinand Perjeta versus Kadcylamaintenance for 1 yearcompleted 12/29/2020 2. 05/17/20:Right lumpectomy ([facility]): scattered foci of residual invasive lobular carcinoma, 0.6-1.0cm, grade 2, clear margins, 2 right axillary lymph nodes negative for carcinoma. 3. Adjuvant radiation therapycompleted April 2022 4.Adjuvant antiestrogen therapystarted 11/17/2020 5.Followed by neratinib (to be started in February 2023) SWOG S 1714 neuropathy clinical trial: No adverse effects from participating in the trial --------------------------------------------------------------------------------------------------------------------------------------------- LetrozoleToxicities:Experiencing hot flashes and joint stiffness Neratinib counseling: We discussed pros and cons of neratinib therapy.She is exercising regularly.   Breast cancer surveillance: 12/12/2020: Michelle Anthony: Benign

## 2021-06-02 ENCOUNTER — Inpatient Hospital Stay: Payer: BC Managed Care – PPO | Admitting: *Deleted

## 2021-06-09 ENCOUNTER — Other Ambulatory Visit: Payer: Self-pay

## 2021-06-09 ENCOUNTER — Inpatient Hospital Stay (HOSPITAL_BASED_OUTPATIENT_CLINIC_OR_DEPARTMENT_OTHER): Payer: BC Managed Care – PPO | Admitting: *Deleted

## 2021-06-09 ENCOUNTER — Encounter: Payer: Self-pay | Admitting: *Deleted

## 2021-06-09 ENCOUNTER — Other Ambulatory Visit: Payer: Self-pay | Admitting: *Deleted

## 2021-06-09 DIAGNOSIS — C50411 Malignant neoplasm of upper-outer quadrant of right female breast: Secondary | ICD-10-CM

## 2021-06-09 DIAGNOSIS — Z170 Estrogen receptor positive status [ER+]: Secondary | ICD-10-CM

## 2021-06-09 NOTE — Progress Notes (Signed)
°  2 Identifiers used for verification purposes for this visit. No vital signs taken as this is a telephone visit. SCP reviewed and completed. Pt has some mild right arm pain that she rates as a 3/10 today. She believes it may be tendonitis. Pt says she is sleeping 7 hours most nights.  Pt is exercising 30 minutes 5 days a week on her lunch break. I discussed with pt the benefits of getting 150-300 minutes a week of weight bearing exercise. Pt seems to eat a well-balanced diet with vegetables, nuts and fruit. Pt said she will start on anastrozole on June 15, 2021. She was discontinued from letrozole.Pt will see PCP in July for her annual visit. She will have her annual appt with dermatologist in May. Pt has recently started going to [employer] and she says the stretching has helped the stiffness she was experiencing from the letrozole.

## 2021-06-12 ENCOUNTER — Other Ambulatory Visit: Payer: Self-pay

## 2021-06-12 ENCOUNTER — Ambulatory Visit: Payer: BC Managed Care – PPO | Attending: Surgery

## 2021-06-12 VITALS — Wt 151.4 lb

## 2021-06-12 DIAGNOSIS — Z483 Aftercare following surgery for neoplasm: Secondary | ICD-10-CM | POA: Insufficient documentation

## 2021-06-12 NOTE — Therapy (Signed)
[place] @ [place] [place], [place], [address] Phone: [phone]   Fax:  [phone]  Physical Therapy Treatment  Patient Details  Name: Michelle Anthony MRN: [id number] Date of Birth: May 08, 1965 Referring Provider (PT): Dr. Erroll Luna   Encounter Date: 06/12/2021   PT End of Session - 06/12/21 3646     Visit Number 2   # unchanged due to screen only   PT Start Time 0828    PT Stop Time 0834    PT Time Calculation (min) 6 min    Activity Tolerance Patient tolerated treatment well    Behavior During Therapy [facility] for tasks assessed/performed             Past Medical History:  Diagnosis Date   Erythema nodosum    Family history of breast cancer    Family history of melanoma    Frequent UTI    Frozen shoulder    History of radiation therapy 06/23/20-07/22/20   IMRT to Right Breast, Dr. Gery Pray    STD (sexually transmitted disease)    H/O HSV II    Past Surgical History:  Procedure Laterality Date   BOTOX INJECTION     BREAST LUMPECTOMY WITH RADIOACTIVE SEED AND SENTINEL LYMPH NODE BIOPSY Right 05/17/2020   Procedure: RIGHT BREAST LUMPECTOMY WITH RADIOACTIVE SEED x5 AND SENTINEL LYMPH NODE MAPPING;  Surgeon: Erroll Luna, MD;  Location: [place];  Service: General;  Laterality: Right;   CLOSED MANIPULATION SHOULDER     PORTACATH PLACEMENT Right 01/05/2020   Procedure: INSERTION PORT-A-CATH WITH ULTRASOUND GUIDANCE;  Surgeon: Erroll Luna, MD;  Location: [place];  Service: General;  Laterality: Right;    Vitals:   06/12/21 0830  Weight: 151 lb 6 oz (68.7 kg)     Subjective Assessment - 06/12/21 0830     Subjective Pt returns for her 3 month L-Dex screen.    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 It measures 2.9 cm and is located in the upper outer quadrant. It is  ER/PR positive and HER2 negative with a Ki67 of 20%.                    L-DEX FLOWSHEETS - 06/12/21 0800       L-DEX LYMPHEDEMA SCREENING   Measurement Type Unilateral    L-DEX MEASUREMENT EXTREMITY Upper Extremity    POSITION  Standing    DOMINANT SIDE Right    At Risk Side Right    BASELINE SCORE (UNILATERAL) -1.7    L-DEX SCORE (UNILATERAL) -1.4    VALUE CHANGE (UNILAT) 0.3                                     PT Long Term Goals - 06/13/20 1200       PT LONG TERM GOAL #1   Title Patient will demonstrate she has regained full shoulder ROM and function post operatively compared to baselines.    Time 6    Period Months    Status Achieved                   Plan - 06/12/21 8032     Clinical Impression Statement Pt returns for her 3 month L-Dex screen. Her change from baseline of 0.3 is WNLs so no further  treatment is required at this time except to cont every 3 month L-Dex screens which pt is agreeable to.    PT Next Visit Plan Cont L-Dex screens for up to 2 years from her SLNB (05/17/22)    Consulted and Agree with Plan of Care Patient             Patient will benefit from skilled therapeutic intervention in order to improve the following deficits and impairments:     Visit Diagnosis: Aftercare following surgery for neoplasm     Problem List Patient Active Problem List   Diagnosis Date Noted   Family history of malignant neoplasm of gastrointestinal tract 08/30/2020   Colon cancer screening 08/30/2020   Port-A-Cath in place 01/13/2020   Genetic testing 01/01/2020   Family history of breast cancer    Family history of melanoma    Malignant neoplasm of upper-outer quadrant of right breast in female, estrogen receptor positive ([place]) 12/16/2019   Abnormal mammogram 12/16/2019   Frozen shoulder 12/16/2018   Arthritis of carpometacarpal (CMC) joint of thumb 11/01/2017   Fatigue 11/01/2017    [name], PTA 06/12/2021, 8:37 AM  [place] @ [place], [place], [address] Phone: [phone]   Fax:  [phone]  Name: Michelle Anthony MRN: [id number] Date of Birth: 1965/04/24

## 2021-07-13 NOTE — Progress Notes (Signed)
HEMATOLOGY-ONCOLOGY TELEPHONE VISIT PROGRESS NOTE ? ?I connected with Michelle Anthony on 07/14/21 at 11:00 AM EDT by telephone and verified that I am speaking with the correct person using two identifiers.  ?I discussed the limitations, risks, security and privacy concerns of performing an evaluation and management service by telephone and the availability of in person appointments.  ?I also discussed with the patient that there may be a patient responsible charge related to this service. The patient expressed understanding and agreed to proceed.  ? ?CHIEF COMPLIANT: Follow-up of breast cancer ? ?History of Present Illness: Michelle Anthony is a 56 y.o. with above-mentioned history of breast cancer treated with neoadjuvant chemotherapy, lumpectomy, and Kadcyla maintenance. She presents to the clinic today via telephone follow-up to discuss tolerance of anastrozole as well as starting of anti-HER2 therapy with neratinib.[name]   ?She reports that anastrozole is reasonably well-tolerated.  She continues to have shoulder pain and discomfort which she had even with letrozole and it got better after stopping letrozole.  It appears that the pain is coming back since she is back on antiestrogen therapy. ? ? ?Oncology History  ?Malignant neoplasm of upper-outer quadrant of right breast in female, estrogen receptor positive ([place])  ?12/16/2019 Initial Diagnosis  ? Screening mammogram  showed a right breast asymmetry. Diagnostic mammogram showed a 2.9cm mass at the 9 o'clock position with 2 adjacent masses at the 10 and 8 o'clock positions, 0.9cm and 1.4cm respectively, and an enlarged lymph node, 1.3cm.  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  ?12/31/2019 Genetic Testing  ? Negative genetic testing:  No pathogenic variants detected on the [facility] Breast Cancer STAT Panel + Multi-Cancer Panel. The report date is  12/31/2019.  ? ?The Multi-Cancer Panel offered by [facility] includes sequencing and/or deletion duplication testing of the following 85 genes: AIP, ALK, APC, ATM, AXIN2,BAP1,  BARD1, BLM, BMPR1A, BRCA1, BRCA2, BRIP1, CASR, CDC73, CDH1, CDK4, CDKN1B, CDKN1C, CDKN2A (p14ARF), CDKN2A (p16INK4a), CEBPA, CHEK2, CTNNA1, DICER1, DIS3L2, EGFR (c.2369C>T, p.Thr790Met variant only), EPCAM (Deletion/duplication testing only), FH, FLCN, GATA2, GPC3, GREM1 (Promoter region deletion/duplication testing only), HOXB13 (c.251G>A, p.Gly84Glu), HRAS, KIT, MAX, MEN1, MET, MITF (c.952G>A, p.Glu318Lys variant only), MLH1, MSH2, MSH3, MSH6, MUTYH, NBN, NF1, NF2, NTHL1, PALB2, PDGFRA, PHOX2B, PMS2, POLD1, POLE, POT1, PRKAR1A, PTCH1, PTEN, RAD50, RAD51C, RAD51D, RB1, RECQL4, RET, RNF43, RUNX1, SDHAF2, SDHA (sequence changes only), SDHB, SDHC, SDHD, SMAD4, SMARCA4, SMARCB1, SMARCE1, STK11, SUFU, TERC, TERT, TMEM127, TP53, TSC1, TSC2, VHL, WRN and WT1. ?  ?01/06/2020 - 04/22/2020 Chemotherapy  ? Neoadjuvant chemo with [facility] Perjeta x6 cycles ? ? ?  ? ?  ?05/17/2020 Surgery  ? Right lumpectomy ([facility]): scattered foci of residual invasive lobular carcinoma, 0.6-1.0cm, grade 2, clear margins, 2 right axillary lymph nodes negative for carcinoma. ?  ?06/02/2020 -  Chemotherapy  ?  Patient is on Treatment Plan: BREAST ADO-TRASTUZUMAB EMTANSINE [facility]) Q21D ? ?  ? ?  ?06/24/2020 - 07/22/2020 Radiation Therapy  ? Adj XRT ?  ?11/17/2020 -  Anti-estrogen oral therapy  ? Letrozole 2.5 mg daily ?  ? ? ?REVIEW OF SYSTEMS:   ?Constitutional: Denies fevers, chills or abnormal weight loss ?All other systems were reviewed with the patient and are negative. ?Observations/Objective:  ? ?  ?Assessment Plan:  ?Malignant neoplasm of upper-outer quadrant of right breast in female, estrogen receptor positive ([place]) ?12/16/2019:Screening mammogram  showed a right breast asymmetry. Diagnostic  mammogram showed a 2.9cm mass at the 9 o'clock position with 2 adjacent masses at the 10  and 8 o'clock positions, 0.9cm and 1.4cm respectively, and an enlarged lymph node, 1.3cm.  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  ?Treatment plan: ?1. Neoadjuvant chemotherapy with [place] Perjeta ?6 cycles followed by Herceptin and Perjeta versus Kadcyla maintenance for 1 year completed 12/29/2020 ?2. 05/17/20: Right lumpectomy ([facility]): scattered foci of residual invasive lobular carcinoma, 0.6-1.0cm, grade 2, clear margins, 2 right axillary lymph nodes negative for carcinoma. ?3. Adjuvant radiation therapy completed April 2022 ?4.  Adjuvant antiestrogen therapy started 11/17/2020 switched from letrozole to anastrozole 06/01/2021 ?5.  Followed by neratinib (to be started in February 2023) ?SWOG S 1714 neuropathy clinical trial: No adverse effects from participating in the trial ?---------------------------------------------------------------------------------------------------------------------------------------------  ?Anastrozole toxicities:   ?Rt shoulder pain (starting to return): recommend Tumeric ?Patient would like to stay on the same dosage. ?  ?Neratinib counseling: We discussed pros and cons of neratinib therapy.She is exercising regularly.  We will wait on neratinib until we know that she can handle the anastrozole well. ?   ?We will start at 3 tablets of neratinib daily and then slowly titrate upwards. ? ?Breast cancer surveillance: 12/12/2020: [name]: Benign ?3-week follow-up with Jenny Reichmann our pharmacist for evaluation on neratinib and for titration of the dosage. ? ? ? ?I discussed the assessment and treatment plan with the patient. The patient was provided an opportunity to ask questions and all were answered. The patient agreed with the plan and demonstrated an understanding of the instructions. The patient was advised to call back or seek an in-person evaluation if the symptoms  worsen or if the condition fails to improve as anticipated.  ? ?I provided 20 minutes of non-face-to-face time during this encounter. Harriette Ohara, MD ? ?I Gardiner Coins am scribing for Dr. Lindi Adie ? ?I have reviewed the above documentation for accuracy and completeness, and I agree with the above. ?    ?

## 2021-07-14 ENCOUNTER — Telehealth: Payer: Self-pay

## 2021-07-14 ENCOUNTER — Other Ambulatory Visit (HOSPITAL_COMMUNITY): Payer: Self-pay

## 2021-07-14 ENCOUNTER — Encounter: Payer: Self-pay | Admitting: Hematology and Oncology

## 2021-07-14 ENCOUNTER — Inpatient Hospital Stay: Payer: BC Managed Care – PPO | Attending: Hematology and Oncology | Admitting: Hematology and Oncology

## 2021-07-14 DIAGNOSIS — Z170 Estrogen receptor positive status [ER+]: Secondary | ICD-10-CM | POA: Diagnosis not present

## 2021-07-14 DIAGNOSIS — C50411 Malignant neoplasm of upper-outer quadrant of right female breast: Secondary | ICD-10-CM

## 2021-07-14 MED ORDER — NERATINIB MALEATE 40 MG PO TABS: 120.0000 mg | ORAL_TABLET | Freq: Every day | ORAL | 2 refills | Status: DC

## 2021-07-14 MED ORDER — NERATINIB MALEATE 40 MG PO TABS
120.0000 mg | ORAL_TABLET | Freq: Every day | ORAL | 2 refills | Status: DC
  Filled 2021-07-14: qty 90, 30d supply, fill #0

## 2021-07-14 NOTE — Telephone Encounter (Signed)
Oral Oncology Pharmacist Encounter ? ?Received new prescription for neratinib (Nerlynx) for the treatment of adjuvant therapy for HER2 positive breast cancer, planned duration for a total of 1 year or until disease recurrence. Prescription dose and frequency assessed, will be titrated up. ? ?Labs from 12/29/2020 assessed, no interventions needed. ? ?Current medication list in Epic reviewed, DDIs with Nerlynx identified: ?- calcium: will have patient separate administration between both medications by giving the nerlynx at least 3 hours after the calcium ? ?Evaluated chart and no patient barriers to medication adherence noted.  ? ?Patient agreement for treatment documented in MD note on 07/14/2021. ? ?Prescription has been e-scribed to the [facility] for benefits analysis and approval. ? ?Oral Oncology Clinic will continue to follow for insurance authorization, copayment issues, initial counseling and start date. ? ?Drema Halon, PharmD ?Hematology/Oncology Clinical Pharmacist ?[facility] ?[phone] ?07/14/2021 12:00 PM ? ?

## 2021-07-14 NOTE — Assessment & Plan Note (Signed)
12/16/2019:Screening mammogram ?showed a right breast asymmetry. Diagnostic mammogram showed a 2.9cm mass at the 9 o'clock position with 2 adjacent masses at the 10 and 8 o'clock positions, 0.9cm and 1.4cm respectively, and an enlarged lymph node, 1.3cm. 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 ?Treatment plan: ?1. Neoadjuvant chemotherapy with [place] Perjeta ?6 cycles followed by Herceptin?and Perjeta versus Kadcyla?maintenance for 1 year?completed 12/29/2020 ?2. 05/17/20:?Right lumpectomy ([facility]): scattered foci of residual invasive lobular carcinoma, 0.6-1.0cm, grade 2, clear margins, 2 right axillary lymph nodes negative for carcinoma. ?3. Adjuvant radiation therapy?completed April 2022 ?4.??Adjuvant antiestrogen therapy?started 11/17/2020 switched from letrozole to anastrozole 06/01/2021 ?5.??Followed by neratinib (to be started in February 2023) ?SWOG S 1714 neuropathy clinical trial: No adverse effects from participating in the trial ?---------------------------------------------------------------------------------------------------------------------------------------------? ?Anastrozole?toxicities:?  ?? ?Neratinib counseling: We discussed pros and cons of neratinib therapy.She is exercising regularly.  We will wait on neratinib until we know that she can handle the anastrozole well. ? ? ?Breast cancer surveillance: 12/12/2020: [name]: Benign ?  ?6 weeks telephone visit to discuss tolerance to anastrozole and at that time we can start her on the neratinib if she is doing well. ?

## 2021-07-14 NOTE — Telephone Encounter (Signed)
Oral Oncology Patient Advocate Encounter ?  ?Received notification from [employer] that prior authorization for Nerlynx is required. ?  ?PA submitted on CoverMyMeds ?Key [id number]  ?Status is pending ?  ?Oral Oncology Clinic will continue to follow. ? ?Wynn Maudlin CPHT ?Specialty Pharmacy Patient Advocate ?[place] ?Phone [phone] ?Fax [phone] ?07/14/2021 12:08 PM ? ?

## 2021-07-14 NOTE — Telephone Encounter (Signed)
Oral Oncology Patient Advocate Encounter ?  ?Received notification from [employer] that prior authorization for Nerlynx is required. ?  ?PA submitted on CoverMyMeds ?Key [id number] ?Status is pending ?  ?Oral Oncology Clinic will continue to follow. ? ?Wynn Maudlin CPHT ?Specialty Pharmacy Patient Advocate ?[place] ?Phone [phone] ?Fax [phone] ?07/14/2021 1:25 PM ? ? ?

## 2021-07-14 NOTE — Telephone Encounter (Signed)
Oral Oncology Patient Advocate Encounter ? ?Prior Authorization for Nerlynx has been approved.   ? ?PA# [id number] ?Effective dates: 07/14/21 through 07/14/22 ? ?Patient must use [facility] ? ?Oral Oncology Clinic will continue to follow.  ? ?Wynn Maudlin CPHT ?Specialty Pharmacy Patient Advocate ?[place] ?Phone [phone] ?Fax [phone] ?07/14/2021 1:34 PM ? ?

## 2021-07-17 ENCOUNTER — Telehealth: Payer: Self-pay | Admitting: Hematology and Oncology

## 2021-07-17 NOTE — Telephone Encounter (Signed)
Scheduled appointment per 3/31 los. Patient is aware. ?

## 2021-07-20 NOTE — Telephone Encounter (Signed)
Oral Chemotherapy Pharmacist Encounter ? ?I spoke with patient for overview of: Nerlynx (neratinib) for the maintenance adjuvant treatment of Her-2 receptor positive breast cancer, after completion of Herceptin, planned duration one year. ? ?Last Herceptin infusion: 12/29/2020  ? ?Counseled patient on administration, dosing, side effects, monitoring, drug-food interactions, safe handling, storage, and disposal. ? ?Nerlynx will be initiated on a dose titration schedule. ?Antidiarrheal prophylaxis will not be used.   ?Patient will use loperamide as needed if symptoms of diarrhea occur. ?  ?Patient will take Nerlynx 51m tablets, 3 tablets (1287m by mouth once daily with food and will then slowly titrate upwards. ? ?Patient knows to avoid grapefruit and grapefruit juice while on treatment with Nerlynx. ? ?Patient instructed to avoid use of PPIs or H2RAs while on treatment with Nerlynx, can separate antacids from Nerlynx by 3 hours if acid suppression is needed. ? ?Nerlynx start date: 07/27/2021 at dinner  ?Patient is on calcium and multivitamins and will take them in the morning. ? ?Adverse effects include but are not limited to: diarrhea, nausea, vomiting, mouth sores, fatigue, rash, and abdominal pain.   ? ?Patient instructed to increase or decrease loperamide dose to maintain 1-2 bowel movements per day. ? ?Reviewed with patient importance of keeping a medication schedule and plan for any missed doses. No barriers to medication adherence identified. ? ?Medication reconciliation performed and medication/allergy list updated. ? ?Insurance authorization for Nerlynx has been obtained. ?[facility] will receive medication through [facility] ? ?Patient informed the pharmacy will reach out 5-7 days prior to needing next fill of Nerlynx to coordinate continued medication acquisition to prevent break in therapy. ? ?All questions answered. ? ?Mrs. Kiester voiced understanding and appreciation.  ? ?Medication  education handout placed in mail for patient. Patient knows to call the office with questions or concerns. Oral Chemotherapy Clinic phone number provided to patient.  ? ?KaDrema HalonPharmD ?Hematology/Oncology Clinical Pharmacist ?[phone]/09/2021   12:29 PM ? ? ?

## 2021-08-03 ENCOUNTER — Other Ambulatory Visit: Payer: Self-pay | Admitting: *Deleted

## 2021-08-03 DIAGNOSIS — Z170 Estrogen receptor positive status [ER+]: Secondary | ICD-10-CM

## 2021-08-07 ENCOUNTER — Inpatient Hospital Stay: Payer: BC Managed Care – PPO | Attending: Hematology and Oncology

## 2021-08-07 ENCOUNTER — Other Ambulatory Visit: Payer: Self-pay

## 2021-08-07 ENCOUNTER — Inpatient Hospital Stay: Payer: BC Managed Care – PPO | Admitting: Pharmacist

## 2021-08-07 ENCOUNTER — Encounter: Payer: Self-pay | Admitting: *Deleted

## 2021-08-07 DIAGNOSIS — Z170 Estrogen receptor positive status [ER+]: Secondary | ICD-10-CM | POA: Diagnosis not present

## 2021-08-07 DIAGNOSIS — C50411 Malignant neoplasm of upper-outer quadrant of right female breast: Secondary | ICD-10-CM | POA: Insufficient documentation

## 2021-08-07 LAB — CBC WITH DIFFERENTIAL (CANCER CENTER ONLY)
Abs Immature Granulocytes: 0 10*3/uL (ref 0.00–0.07)
Basophils Absolute: 0 10*3/uL (ref 0.0–0.1)
Basophils Relative: 1 %
Eosinophils Absolute: 0.2 10*3/uL (ref 0.0–0.5)
Eosinophils Relative: 5 %
HCT: 41.1 % (ref 36.0–46.0)
Hemoglobin: 13.9 g/dL (ref 12.0–15.0)
Immature Granulocytes: 0 %
Lymphocytes Relative: 42 %
Lymphs Abs: 1.9 10*3/uL (ref 0.7–4.0)
MCH: 31.2 pg (ref 26.0–34.0)
MCHC: 33.8 g/dL (ref 30.0–36.0)
MCV: 92.4 fL (ref 80.0–100.0)
Monocytes Absolute: 0.4 10*3/uL (ref 0.1–1.0)
Monocytes Relative: 10 %
Neutro Abs: 2 10*3/uL (ref 1.7–7.7)
Neutrophils Relative %: 42 %
Platelet Count: 229 10*3/uL (ref 150–400)
RBC: 4.45 MIL/uL (ref 3.87–5.11)
RDW: 12.3 % (ref 11.5–15.5)
WBC Count: 4.6 10*3/uL (ref 4.0–10.5)
nRBC: 0 % (ref 0.0–0.2)

## 2021-08-07 LAB — CMP (CANCER CENTER ONLY)
ALT: 22 U/L (ref 0–44)
AST: 22 U/L (ref 15–41)
Albumin: 4.6 g/dL (ref 3.5–5.0)
Alkaline Phosphatase: 71 U/L (ref 38–126)
Anion gap: 10 (ref 5–15)
BUN: 18 mg/dL (ref 6–20)
CO2: 29 mmol/L (ref 22–32)
Calcium: 9.8 mg/dL (ref 8.9–10.3)
Chloride: 103 mmol/L (ref 98–111)
Creatinine: 0.77 mg/dL (ref 0.44–1.00)
GFR, Estimated: >60 mL/min (ref >60)
Glucose, Bld: 97 mg/dL (ref 70–99)
Potassium: 3.7 mmol/L (ref 3.5–5.1)
Sodium: 142 mmol/L (ref 135–145)
Total Bilirubin: 0.6 mg/dL (ref 0.3–1.2)
Total Protein: 7.5 g/dL (ref 6.5–8.1)

## 2021-08-07 MED ORDER — NERATINIB MALEATE 40 MG PO TABS: 160.0000 mg | ORAL_TABLET | Freq: Every day | ORAL | 2 refills | Status: DC

## 2021-08-07 NOTE — Progress Notes (Signed)
?[place]  ?     Telephone: [phone]?Fax: [phone]  ? ?Oncology Clinical Pharmacist Practitioner Initial Assessment ? ?Michelle Anthony is a 56 y.o. female with a diagnosis of breast cancer. They were contacted today via in person visit. ? ?Indication/Regimen ?Neratinib (Nerlynx?) is being used appropriately for treatment of breast cancer by Dr. Nicholas Lose.  ?  ?  ?Wt Readings from Last 1 Encounters:  ?08/07/21 150 lb (68 kg)  ? ? Estimated body surface area is 1.77 meters squared as calculated from the following: ?  Height as of this encounter: '5\' 5"'$  (1.651 m). ?  Weight as of this encounter: 150 lb (68 kg). ? ?The dosing regimen is 3 tablets (120 mg) by mouth daily on days 1 to 28 of a 28-day cycle. This is being given in combination with anastrozole. It is planned to continue until 1 year of therapy in the extended adjuvant setting.  Michelle Anthony was seen today by clinical pharmacy after being referred by Dr. Lindi Adie for her neratinib management.  She started neratinib 3 tablets daily on 07/27/21.  So far she is tolerating this well.  She has had 2 episodes of diarrhea.  1 on 07/31/21 after eating [place].  And 1 episode on 08/04/21 after having peanut butter and some oatmeal.  Both of these episodes responded to loperamide with 1 tablet being taken.  The plan is to continue to dose escalate the neratinib and today we spoke of increasing her dose to 4 tablets on 08/10/21.  Michelle Anthony is agreeable to this and we will send a new prescription to [facility] with these instructions. ? ?Today we went over the possible side effects from neratinib which include but are not limited to diarrhea, liver toxicity, nausea, vomiting, and fatigue.  Michelle Anthony does have ondansetron and prochlorperazine at home which she has not needed.  She will contact the clinic should she need more of these prescriptions or if they are expired.  Michelle Anthony also asked for a wig prescription and we will asked nursing about  how to obtain this for Michelle Anthony. ? ?Michelle Anthony will see clinical pharmacy per manufacturing guidelines with labs monthly for the first 3 months, then every 3 months thereafter if she is tolerating neratinib.  We will continue to dose increase until unacceptable toxicities. ? ?Dose Modifications ?Neratinib 3 tabs (120 mg) daily with food.  We will continue to dose escalate up to 6 tabs daily if tolerated. ? ?Access Assessment ?Michelle Anthony will be receiving neratinib through [facility] ?Insurance Concerns: None ?Start date if known: 07/27/21 ? ?Allergies ?No Known Allergies ? ?Vitals ? ?  08/07/2021  ?  9:14 AM 06/12/2021  ?  8:30 AM 06/01/2021  ?  9:09 AM  ?Vitals with BMI  ?Height '5\' 5"'$   '5\' 5"'$   ?Weight 150 lbs 151 lbs 6 oz 150 lbs 11 oz  ?BMI 24.96 25.19 25.08  ?Systolic 696  295  ?Diastolic 80  87  ?Pulse 71  79  ?  ? ?Laboratory Data ? ?  Latest Ref Rng & Units 08/07/2021  ?  8:11 AM 12/29/2020  ?  8:31 AM 12/08/2020  ?  8:45 AM  ?CBC EXTENDED  ?WBC 4.0 - 10.5 K/uL 4.6   4.1   4.9    ?RBC 3.87 - 5.11 MIL/uL 4.45   4.20   4.17    ?Hemoglobin 12.0 - 15.0 g/dL 13.9   13.0   13.1    ?  HCT 36.0 - 46.0 % 41.1   38.3   38.3    ?Platelets 150 - 400 K/uL 229   211   219    ?NEUT# 1.7 - 7.7 K/uL 2.0   1.7   2.2    ?Lymph# 0.7 - 4.0 K/uL 1.9   1.9   2.0    ?  ? ?  Latest Ref Rng & Units 08/07/2021  ?  8:11 AM 12/29/2020  ?  8:31 AM 12/08/2020  ?  8:45 AM  ?CMP  ?Glucose 70 - 99 mg/dL 97   89   106    ?BUN 6 - 20 mg/dL '18   20   23    '$ ?Creatinine 0.44 - 1.00 mg/dL 0.77   0.71   0.77    ?Sodium 135 - 145 mmol/L 142   143   142    ?Potassium 3.5 - 5.1 mmol/L 3.7   3.5   3.7    ?Chloride 98 - 111 mmol/L 103   105   106    ?CO2 22 - 32 mmol/L '29   28   28    '$ ?Calcium 8.9 - 10.3 mg/dL 9.8   10.2   10.0    ?Total Protein 6.5 - 8.1 g/dL 7.5   7.5   7.4    ?Total Bilirubin 0.3 - 1.2 mg/dL 0.6   0.6   0.4    ?Alkaline Phos 38 - 126 U/L 71   71   75    ?AST 15 - 41 U/L 22   35   31    ?ALT 0 - 44 U/L 22   43   36    ? ?No  results found for: MG  ? ?Contraindications ?Contraindications were reviewed?  Yes ?Contraindications to therapy were identified?  No ? ?Safety Precautions ?The following safety precautions for the use of neratinib were reviewed:  ?Diarrhea: has loperamide on hand and reviewed as needed instructions ?Liver toxicity: reviewed possible side effects such as RUQ pain, yellowing of skin/eyes ?Drug interactions: PPIs (avoid), H2RAs, antacids discussed how to space these agents out from neratinib ?Nausea / Vomiting: has ondansetron and prochlorperazine on hand ? ?Medication Reconciliation ?Current Outpatient Medications  ?Medication Sig Dispense Refill  ? anastrozole (ARIMIDEX) 1 MG tablet Take 1 tablet (1 mg total) by mouth daily. 90 tablet 3  ? CALCIUM/MAGNESIUM/ZINC FORMULA PO Take 1,000 mg by mouth daily.    ? COLLAGEN PO Take by mouth daily. Pt takes two scoops of powder in morning and night in coffee and almond milk    ? ibuprofen (ADVIL) 200 MG tablet Take 3 tablets (600 mg total) by mouth every 8 (eight) hours as needed.    ? Multiple Vitamin (MULTIVITAMIN) tablet Take 1 tablet by mouth daily.    ? Neratinib Maleate (NERLYNX) 40 MG tablet Take 3 tablets (120 mg total) by mouth daily. Take with food. (Patient taking differently: Take 120 mg by mouth daily. Take with food. Will start 4 tablets on 08/10/21) 90 tablet 2  ? Tretinoin (RETIN-A EX) Apply topically.    ? ?No current facility-administered medications for this visit.  ? ? ?Medication reconciliation is based on the patient's most recent medication list in the electronic medical record (EMR) including herbal products and OTC medications.  ? ?The patient's medication list was reviewed today with the patient?  Yes ? ?Drug-drug interactions (DDIs) ?DDIs were evaluated?  Yes ?Significant DDIs identified?  Yes, Michelle Anthony will separate out her calcium supplement and  neratinib by at least 3 hours. ? ?Drug-Food Interactions ?Drug-food interactions were evaluated?   Yes ?Significant Drug-food interactions identified?  Michelle Anthony will avoid grapefruit products ? ?Follow-up Plan  ?Start neratinib 4 tabs (160 mg) daily with food on 08/10/21 if tolerated. ?Continue anastrozole daily ?Labs, clinical pharmacy visit, in 4 weeks and in 8 weeks. ?New prescription being sent to [facility] for neratinib 4 tablets daily once her current prescription is exhausted. ?We will discuss with Dr. Lindi Adie and nursing on how best to obtain a wig prescription for Michelle Anthony ? ?Michelle Anthony participated in the discussion, expressed understanding, and voiced agreement with the above plan. All questions were answered to her satisfaction. The patient was advised to contact the clinic at [phone] with any questions or concerns prior to her return visit.  ? ?I spent 45 minutes assessing the patient. ? ?Raina Mina, RPH-CPP, 08/07/2021 9:47 AM ? ?**Disclaimer: This note was dictated with voice recognition software. Similar sounding words can inadvertently be transcribed and this note may contain transcription errors which may not have been corrected upon publication of note.** ? ?

## 2021-08-07 NOTE — Progress Notes (Signed)
Per pt request, wig prescription faxed to [facility] [phone]). ?

## 2021-08-08 ENCOUNTER — Encounter (HOSPITAL_COMMUNITY): Payer: Self-pay

## 2021-08-08 ENCOUNTER — Telehealth: Payer: Self-pay | Admitting: Pharmacist

## 2021-08-08 NOTE — Telephone Encounter (Signed)
Scheduled appointment per 04/24 los. Patient aware.  ?

## 2021-09-04 ENCOUNTER — Ambulatory Visit: Payer: BC Managed Care – PPO | Attending: Surgery

## 2021-09-04 ENCOUNTER — Other Ambulatory Visit: Payer: Self-pay | Admitting: *Deleted

## 2021-09-04 VITALS — Wt 152.0 lb

## 2021-09-04 DIAGNOSIS — Z483 Aftercare following surgery for neoplasm: Secondary | ICD-10-CM | POA: Insufficient documentation

## 2021-09-04 DIAGNOSIS — M25511 Pain in right shoulder: Secondary | ICD-10-CM

## 2021-09-04 NOTE — Therapy (Signed)
  OUTPATIENT PHYSICAL THERAPY SOZO SCREENING NOTE   Patient Name: Michelle Anthony MRN: [id number] DOB:19-Apr-1965, 56 y.o., female Today's Date: 09/04/2021  PCP: Katherina Mires, MD REFERRING PROVIDER: Katherina Mires, MD   PT End of Session - 09/04/21 [phone]     Visit Number 2   # unchanged due to screen only   PT Start Time 0956    PT Stop Time 1000    PT Time Calculation (min) 4 min    Activity Tolerance Patient tolerated treatment well    Behavior During Therapy [facility] for tasks assessed/performed             Past Medical History:  Diagnosis Date   Erythema nodosum    Family history of breast cancer    Family history of melanoma    Frequent UTI    Frozen shoulder    History of radiation therapy 06/23/20-07/22/20   IMRT to Right Breast, Dr. Gery Pray    STD (sexually transmitted disease)    H/O HSV II   Past Surgical History:  Procedure Laterality Date   BOTOX INJECTION     BREAST LUMPECTOMY WITH RADIOACTIVE SEED AND SENTINEL LYMPH NODE BIOPSY Right 05/17/2020   Procedure: RIGHT BREAST LUMPECTOMY WITH RADIOACTIVE SEED x5 AND SENTINEL LYMPH NODE MAPPING;  Surgeon: Erroll Luna, MD;  Location: [place];  Service: General;  Laterality: Right;   CLOSED MANIPULATION SHOULDER     PORTACATH PLACEMENT Right 01/05/2020   Procedure: INSERTION PORT-A-CATH WITH ULTRASOUND GUIDANCE;  Surgeon: Erroll Luna, MD;  Location: [place];  Service: General;  Laterality: Right;   Patient Active Problem List   Diagnosis Date Noted   Family history of malignant neoplasm of gastrointestinal tract 08/30/2020   Colon cancer screening 08/30/2020   Port-A-Cath in place 01/13/2020   Genetic testing 01/01/2020   Family history of breast cancer    Family history of melanoma    Malignant neoplasm of upper-outer quadrant of right breast in female, estrogen receptor positive ([place]) 12/16/2019   Abnormal mammogram 12/16/2019   Frozen shoulder 12/16/2018    Arthritis of carpometacarpal ([place]) joint of thumb 11/01/2017   Fatigue 11/01/2017    REFERRING DIAG: right breast cancer at risk for lymphedema  THERAPY DIAG:  Aftercare following surgery for neoplasm  PERTINENT HISTORY: Patient was diagnosed on 12/02/2019 with 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 It measures 2.9 cm and is located in the upper outer quadrant. It is ER/PR positive and HER2 negative with a Ki67 of 20%  PRECAUTIONS: right UE Lymphedema risk, None  SUBJECTIVE: Pt returns for her 3 month L-Dex screen.   PAIN:  Are you having pain? No  SOZO SCREENING: Patient was assessed today using the SOZO machine to determine the lymphedema index score. This was compared to her baseline score. It was determined that she is within the recommended range when compared to her baseline and no further action is needed at this time. She will continue SOZO screenings. These are done every 3 months for 2 years post operatively followed by every 6 months for 2 years, and then annually.    [name], PTA 09/04/2021, 10:01 AM

## 2021-09-05 ENCOUNTER — Inpatient Hospital Stay: Payer: BC Managed Care – PPO | Attending: Hematology and Oncology

## 2021-09-05 ENCOUNTER — Inpatient Hospital Stay: Payer: BC Managed Care – PPO | Admitting: Pharmacist

## 2021-09-05 DIAGNOSIS — C50411 Malignant neoplasm of upper-outer quadrant of right female breast: Secondary | ICD-10-CM | POA: Diagnosis present

## 2021-09-05 DIAGNOSIS — Z170 Estrogen receptor positive status [ER+]: Secondary | ICD-10-CM

## 2021-09-05 LAB — CBC WITH DIFFERENTIAL (CANCER CENTER ONLY)
Abs Immature Granulocytes: 0.01 10*3/uL (ref 0.00–0.07)
Basophils Absolute: 0 10*3/uL (ref 0.0–0.1)
Basophils Relative: 0 %
Eosinophils Absolute: 0.3 10*3/uL (ref 0.0–0.5)
Eosinophils Relative: 4 %
HCT: 37.9 % (ref 36.0–46.0)
Hemoglobin: 13 g/dL (ref 12.0–15.0)
Immature Granulocytes: 0 %
Lymphocytes Relative: 33 %
Lymphs Abs: 2.3 10*3/uL (ref 0.7–4.0)
MCH: 31.4 pg (ref 26.0–34.0)
MCHC: 34.3 g/dL (ref 30.0–36.0)
MCV: 91.5 fL (ref 80.0–100.0)
Monocytes Absolute: 0.4 10*3/uL (ref 0.1–1.0)
Monocytes Relative: 6 %
Neutro Abs: 3.9 10*3/uL (ref 1.7–7.7)
Neutrophils Relative %: 57 %
Platelet Count: 227 10*3/uL (ref 150–400)
RBC: 4.14 MIL/uL (ref 3.87–5.11)
RDW: 12.6 % (ref 11.5–15.5)
WBC Count: 6.9 10*3/uL (ref 4.0–10.5)
nRBC: 0 % (ref 0.0–0.2)

## 2021-09-05 LAB — CMP (CANCER CENTER ONLY)
ALT: 25 U/L (ref 0–44)
AST: 18 U/L (ref 15–41)
Albumin: 4.2 g/dL (ref 3.5–5.0)
Alkaline Phosphatase: 65 U/L (ref 38–126)
Anion gap: 5 (ref 5–15)
BUN: 18 mg/dL (ref 6–20)
CO2: 31 mmol/L (ref 22–32)
Calcium: 9.4 mg/dL (ref 8.9–10.3)
Chloride: 105 mmol/L (ref 98–111)
Creatinine: 0.73 mg/dL (ref 0.44–1.00)
GFR, Estimated: >60 mL/min (ref >60)
Glucose, Bld: 129 mg/dL — ABNORMAL HIGH (ref 70–99)
Potassium: 3.4 mmol/L — ABNORMAL LOW (ref 3.5–5.1)
Sodium: 141 mmol/L (ref 135–145)
Total Bilirubin: 0.4 mg/dL (ref 0.3–1.2)
Total Protein: 7 g/dL (ref 6.5–8.1)

## 2021-09-05 MED ORDER — LOPERAMIDE HCL 2 MG PO CAPS: ORAL_CAPSULE | ORAL | 2 refills | Status: DC

## 2021-09-05 MED ORDER — ONDANSETRON HCL 8 MG PO TABS: 8.0000 mg | ORAL_TABLET | Freq: Three times a day (TID) | ORAL | 2 refills | Status: DC | PRN

## 2021-09-05 MED ORDER — PROCHLORPERAZINE MALEATE 10 MG PO TABS: 10.0000 mg | ORAL_TABLET | Freq: Four times a day (QID) | ORAL | 2 refills | Status: DC | PRN

## 2021-09-05 NOTE — Progress Notes (Signed)
Michelle Anthony       Telephone: [phone]?Fax: [phone]   Oncology Clinical Pharmacist Practitioner Progress Note  Michelle Anthony was contacted via in person visit to discuss her chemotherapy regimen for neratinib which they receive under the care of Dr. Nicholas Lose.   Current treatment regimen and start date Neratinib (07/27/21) Anastrozole (06/01/21)  Interval History She continues on neratinib 4 tablets (160 mg) by mouth daily on days 1 to 28 of a 28-day cycle. This is being given in combination with anastrozole. Therapy is planned to continue until 1 year of therapy in the extended adjuvant setting.   Response to Therapy Michelle Anthony was seen today by clinical pharmacy as follow-up to her neratinib management.  She was last seen by clinical pharmacy on 08/07/21 and Dr. Lindi Adie on 07/14/21.  She is tolerating neratinib fairly well.  She does report some diarrhea, about 2-3 times per week.  During these episodes she is going about 3 times per day.  She states the loperamide is working very well.  She has been taking 1 tablet for each loose stool.  We discussed that she could consider taking 2 tabs with the first loose stool and then 1 tab for each additional loose stool up to 8 tabs in a 24-hour period.  She has been purchasing this over-the-counter and she requested a prescription be sent today to her pharmacy of choice which we have done.  She is also having some nausea which she has not taken any antinausea meds for yet but we have prescribed ondansetron and prochlorperazine today per her request.  We reviewed how to take these medications as needed.  Her potassium was just below the lower limit of normal today at 3.4 mmol/L.  We have given her some written education on foods that she could consider adding that are rich in potassium.  We discussed potentially going up to 5 tablets at her next visit which is in June.  We will move this appointment up 1 week since she will be  out of town on 10/03/21.  She will then see Dr. Lindi Adie the following month in July.  At that time if she is tolerating neratinib well, manufacturing guidelines state every 64-monthlabs can be considered.  Labs, vitals, treatment parameters, and manufacturer guidelines assessing toxicity were reviewed with Michelle Anthony. Based on these values, patient is in agreement to continue neratinib therapy at this time.  Allergies No Known Allergies  Vitals    09/05/2021    1:26 PM 09/04/2021    9:57 AM 08/07/2021    9:14 AM  Vitals with BMI  Height '5\' 5"'$   '5\' 5"'$   Weight 151 lbs 8 oz 152 lbs 150 lbs  BMI 268.12 275.17 Systolic 1001 1749 Diastolic 58  80  Pulse 65  71     Laboratory Data    Latest Ref Rng & Units 09/05/2021    1:04 PM 08/07/2021    8:11 AM 12/29/2020    8:31 AM  CBC EXTENDED  WBC 4.0 - 10.5 K/uL 6.9   4.6   4.1    RBC 3.87 - 5.11 MIL/uL 4.14   4.45   4.20    Hemoglobin 12.0 - 15.0 g/dL 13.0   13.9   13.0    HCT 36.0 - 46.0 % 37.9   41.1   38.3    Platelets 150 - 400 K/uL 227   229   211  NEUT# 1.7 - 7.7 K/uL 3.9   2.0   1.7    Lymph# 0.7 - 4.0 K/uL 2.3   1.9   1.9         Latest Ref Rng & Units 09/05/2021    1:04 PM 08/07/2021    8:11 AM 12/29/2020    8:31 AM  CMP  Glucose 70 - 99 mg/dL 129   97   89    BUN 6 - 20 mg/dL '18   18   20    '$ Creatinine 0.44 - 1.00 mg/dL 0.73   0.77   0.71    Sodium 135 - 145 mmol/L 141   142   143    Potassium 3.5 - 5.1 mmol/L 3.4   3.7   3.5    Chloride 98 - 111 mmol/L 105   103   105    CO2 22 - 32 mmol/L '31   29   28    '$ Calcium 8.9 - 10.3 mg/dL 9.4   9.8   10.2    Total Protein 6.5 - 8.1 g/dL 7.0   7.5   7.5    Total Bilirubin 0.3 - 1.2 mg/dL 0.4   0.6   0.6    Alkaline Phos 38 - 126 U/L 65   71   71    AST 15 - 41 U/L 18   22   35    ALT 0 - 44 U/L 25   22   43      No results found for: MG   Adverse Effects Assessment Diarrhea: As above, loperamide working well.  Consider using 2 tablets with first loose stool.   Prescription sent per patient request. Nausea: Has not used any antinausea meds as of yet.  Requested ondansetron and prochlorperazine prescription to be sent to her pharmacy of choice.  This has been done and we reviewed instructions for both agents.  Adherence Assessment Michelle Anthony reports missing 0 doses over the past 4 weeks.   Reason for missed dose: Not applicable Patient was re-educated on importance of adherence.   Access Assessment Michelle Anthony is currently receiving her neratinib through [facility] concerns: None  Medication Reconciliation The patient's medication list was reviewed today with the patient?  Yes New medications or herbal supplements have recently been started?  No Any medications have been discontinued?  No The medication list was updated and reconciled based on the patient's most recent medication list in the electronic medical record (EMR) including herbal products and OTC medications.   Medications Current Outpatient Medications  Medication Sig Dispense Refill   loperamide (IMODIUM) 2 MG capsule Take 2 tabs (4 mg) with first loose stool, then 1 tab (2 mg) with each additional loose stool. Do not take more than 8 tabs (16 mg) in a 24-hour period. 30 capsule 2   ondansetron (ZOFRAN) 8 MG tablet Take 1 tablet (8 mg total) by mouth every 8 (eight) hours as needed for nausea or vomiting. 30 tablet 2   anastrozole (ARIMIDEX) 1 MG tablet Take 1 tablet (1 mg total) by mouth daily. 90 tablet 3   CALCIUM/MAGNESIUM/ZINC FORMULA PO Take 1,000 mg by mouth daily.     COLLAGEN PO Take by mouth daily. Pt takes two scoops of powder in morning and night in coffee and almond milk     ibuprofen (ADVIL) 200 MG tablet Take 3 tablets (600 mg total) by mouth every 8 (eight) hours as needed.  Multiple Vitamin (MULTIVITAMIN) tablet Take 1 tablet by mouth daily.     Neratinib Maleate (NERLYNX) 40 MG tablet Take 4 tablets (160 mg total) by mouth  daily. Take with food. Start when current prescription has been exhausted. 112 tablet 2   prochlorperazine (COMPAZINE) 10 MG tablet Take 1 tablet (10 mg total) by mouth every 6 (six) hours as needed (Nausea or vomiting). 30 tablet 2   Tretinoin (RETIN-A EX) Apply topically.     No current facility-administered medications for this visit.    Drug-Drug Interactions (DDIs) DDIs were evaluated?  Yes Significant DDIs?  No The patient was instructed to speak with their health care provider and/or the oral chemotherapy pharmacist before starting any new drug, including prescription or over the counter, natural / herbal products, or vitamins.  Supportive Care Continue as needed loperamide Use ondansetron and prochlorperazine as needed.  Dosing Assessment Hepatic adjustments needed?  No Renal adjustments needed?  No Toxicity adjustments needed?  No The current dosing regimen is appropriate to continue at this time.  Follow-Up Plan Continue neratinib 4 tabs daily.  May consider increasing dose to 5 tabs at June appointment. We will move 10/03/21 appointment with labs to 09/26/21 per patient request Continue loperamide as needed for diarrhea.  Use prochlorperazine and ondansetron for nausea as needed.  Prescription sent per patient request Incorporate potassium rich foods into diet.  Literature given. Labs, Dr. Lindi Adie visit, on 10/31/21 Labs, pharmacy clinic visit, on 01/23/22  Michelle Anthony participated in the discussion, expressed understanding, and voiced agreement with the above plan. All questions were answered to her satisfaction. The patient was advised to contact the clinic at [phone] with any questions or concerns prior to her return visit.   I spent 30 minutes assessing and educating the patient.  Raina Mina, RPH-CPP, 09/05/2021  1:58 PM   **Disclaimer: This note was dictated with voice recognition software. Similar sounding words can inadvertently be transcribed and  this note may contain transcription errors which may not have been corrected upon publication of note.**

## 2021-09-12 NOTE — Therapy (Signed)
OUTPATIENT PHYSICAL THERAPY CERVICAL EVALUATION   Patient Name: Michelle Anthony MRN: [id number] DOB:1966-02-12, 56 y.o., female Today's Date: 09/13/2021   PT End of Session - 09/13/21 1321     Visit Number 1   shoulder   Authorization Type [employer]    PT Start Time 0349    PT Stop Time 1791    PT Time Calculation (min) 46 min    Activity Tolerance Patient tolerated treatment well    Behavior During Therapy WFL for tasks assessed/performed             Past Medical History:  Diagnosis Date   Erythema nodosum    Family history of breast cancer    Family history of melanoma    Frequent UTI    Frozen shoulder    History of radiation therapy 06/23/20-07/22/20   IMRT to Right Breast, Dr. Gery Pray    STD (sexually transmitted disease)    H/O HSV II   Past Surgical History:  Procedure Laterality Date   BOTOX INJECTION     BREAST LUMPECTOMY WITH RADIOACTIVE SEED AND SENTINEL LYMPH NODE BIOPSY Right 05/17/2020   Procedure: RIGHT BREAST LUMPECTOMY WITH RADIOACTIVE SEED x5 AND SENTINEL LYMPH NODE MAPPING;  Surgeon: Erroll Luna, MD;  Location: [facility];  Service: General;  Laterality: Right;   CLOSED MANIPULATION SHOULDER     PORTACATH PLACEMENT Right 01/05/2020   Procedure: INSERTION PORT-A-CATH WITH ULTRASOUND GUIDANCE;  Surgeon: Erroll Luna, MD;  Location: [facility];  Service: General;  Laterality: Right;   Patient Active Problem List   Diagnosis Date Noted   Family history of malignant neoplasm of gastrointestinal tract 08/30/2020   Colon cancer screening 08/30/2020   Port-A-Cath in place 01/13/2020   Genetic testing 01/01/2020   Family history of breast cancer    Family history of melanoma    Malignant neoplasm of upper-outer quadrant of right breast in female, estrogen receptor positive ([place]) 12/16/2019   Abnormal mammogram 12/16/2019   Frozen shoulder 12/16/2018   Arthritis of carpometacarpal ([place]) joint of thumb 11/01/2017    Fatigue 11/01/2017    PCP: Suzanna Obey, MD  REFERRING PROVIDER: Nicholas Lose, MD  REFERRING DIAG: Right shoulder pain, unspecified chronicity  - Primary  THERAPY DIAG:  Chronic right shoulder pain - Plan: PT plan of care cert/re-cert  Stiffness of right shoulder, not elsewhere classified - Plan: PT plan of care cert/re-cert  Cramp and spasm - Plan: PT plan of care cert/re-cert  Rationale for Evaluation and Treatment Rehabilitation  ONSET DATE: 1 year ago  SUBJECTIVE:  SUBJECTIVE STATEMENT: Pt is a 56 y.o female with history of Rt breast cancer with 1 year of chemo followed by radiation that ended  a year ago that presents with Rt shoulder pain and limited A/ROM.  Pt has had frozen shoulder on the Lt in the past and has been doing the exercises she was doing for that.  She had a manipulation on the Lt years ago.    PERTINENT HISTORY:  Rt breast cancer: radiation 06/23/20-07/22/20 Rt lumpectomy 05/2020 Lt frozen shoulder with manipulation  PAIN:  Are you having pain? Yes: NPRS scale: 0-8/10 Pain location: Rt shoulder and arm Pain description: shooting Aggravating factors: sleep at night, getting dressed, reaching overhead   Relieving factors: heat, rest, ice, massage   PRECAUTIONS: Other: Breast cancer and radiation to Rt breast  WEIGHT BEARING RESTRICTIONS No  FALLS:  Has patient fallen in last 6 months? No  LIVING ENVIRONMENT: Lives with: lives with their family  OCCUPATION: desk work  PLOF: Independent  PATIENT GOALS reduce Rt shoulder pain, improve Rt UE A/ROM  OBJECTIVE:   DIAGNOSTIC FINDINGS:  None   PATIENT SURVEYS:  FOTO 44 (goal is 68)  COGNITION: Overall cognitive status: Within functional limits for tasks assessed   SENSATION: WFL  POSTURE: No  Significant postural limitations  PALPATION: Palpable trigger points and tension over lateral rib cage, pecs and lats on the Rt.  Global tenderness over Rt glenohumeral joint.  Reduced Rt glenohumeral joint mobility in all directions,    CERVICAL ROM:  WFLs without pain  UPPER EXTREMITY ROM:  Active ROM Right eval Left eval  Shoulder flexion 100 Full without pain  Shoulder extension    Shoulder abduction 65   Shoulder adduction    Shoulder extension    Shoulder internal rotation To Rt buttock   Shoulder external rotation To Rt upper trap   Elbow flexion    Elbow extension    Wrist flexion    Wrist extension    Wrist ulnar deviation    Wrist radial deviation    Wrist pronation    Wrist supination     (Blank rows = not tested) PROM Rt: flexion 100, ER 35, IR 25 with pain and hard end feel  UPPER EXTREMITY MMT:  MMT Right eval Left eval  Shoulder flexion 4-/5 5/5 throughout   Shoulder extension 4/5   Shoulder abduction 3+/5   Shoulder adduction    Shoulder extension    Shoulder internal rotation 4+/5   Shoulder external rotation 3+/5                                                (Blank rows = not tested)   TODAY'S TREATMENT:  Treatment on date: 09/13/21 HEP established: see below   Trigger Point Dry-Needling  Treatment instructions: Expect mild to moderate muscle soreness. S/S of pneumothorax if dry needled over a lung field, and to seek immediate medical attention should they occur. Patient verbalized understanding of these instructions and education.  Patient Consent Given: Yes Education handout provided: Yes Muscles treated: Rt lats along lateral rib cage, lats posterior to axilla and pecs Treatment response/outcome: twitch response   Skilled palpation and monitoring by PT during dry needling   PATIENT EDUCATION:  Education details: Access Code: [email or web address] Person educated: Patient Education method: Explanation, Demonstration, and  Handouts Education comprehension: verbalized understanding and returned demonstration  HOME EXERCISE PROGRAM: Access Code: [email or web address] URL: [email or web address] Date: 09/13/2021 Prepared by: Claiborne Billings  Exercises - Seated Shoulder Flexion AAROM with Pulley Behind  - 1 x daily - 7 x weekly - 3 sets - 10 reps - Seated Shoulder Scaption AAROM with Pulley at Side  - 1 x daily - 7 x weekly - 3 sets - 10 reps - Standing Shoulder Internal Rotation AAROM with Pulley  - 1 x daily - 7 x weekly - 3 sets - 10 reps - Supine Shoulder Flexion AAROM with Hands Clasped  - 3 x daily - 7 x weekly - 1 sets - 10 reps - 10 hold - Seated Shoulder Flexion Towel Slide at Table Top  - 3 x daily - 7 x weekly - 1 sets - 10 reps - 10 hold  ASSESSMENT:  CLINICAL IMPRESSION: Patient is a 56 y.o. female who was seen today for physical therapy evaluation and treatment for rt shoulder pain and limited A/ROM. Pt has history of Rt breast cancer with radiation to this area 1 year ago.  Pt has history of Lt frozen shoulder that resulted in manipulation surgery. Pt demonstrates significant Rt shoulder A/ROM deficits and trigger points over Rt lateral ribs, lats, pecs and glenohumeral joint.  Patient will benefit from skilled PT to address the below impairments and improve overall function.    OBJECTIVE IMPAIRMENTS decreased ROM, decreased strength, increased fascial restrictions, increased muscle spasms, impaired UE functional use, postural dysfunction, and pain.   ACTIVITY LIMITATIONS carrying, lifting, and transfers  PARTICIPATION LIMITATIONS: cleaning and laundry  PERSONAL FACTORS 1-2 comorbidities: Rt breast cancer with lumpectomy and node removal, Lt frozen shoulder   are also affecting patient's functional outcome.   REHAB POTENTIAL: Good  CLINICAL DECISION MAKING: Stable/uncomplicated  EVALUATION COMPLEXITY: Low   GOALS: Goals reviewed with patient? Yes  SHORT TERM GOALS: Target date: 10/10/21   Be  independent in initial HEP Baseline:  Goal status: INITIAL  2.  Demonstrate Rt shoulder A/ROM flexion to > or = to 110 degrees to improve overhead reaching  Baseline: 100 Goal status: INITIAL  3.  Demonstrate Rt shoulder IR A/ROM to L3 to improve dressing  Baseline: lateral gluteals  Goal status: INITIAL  4.  Report > or = to 25% reduction in Rt shoulder pain with dressing and reaching overhead  Baseline:  Goal status: INITIAL    LONG TERM GOALS: Target date: 11/08/21  Be independent in advanced HEP Baseline:  Goal status: INITIAL  2.  Improve FOTO to > or = to 68 Baseline: 44 Goal status: INITIAL  3.  Demonstrate Rt shoulder A/ROM flexion to > or = to 120 degrees to improve overhead reaching  Baseline: 100 Goal status: INITIAL  4.  Demonstrate Rt shoulder A/ROM IR to L1 to improve dressing  Baseline: lateral gluteals  Goal status: INITIAL  5.  Demonstrate Rt shoulder A/ROM ER to occiput to assist with self-care Baseline: Rt upper trap Goal status: INITIAL  6.  Report > or = to 60% reduction in Rt shoulder pain with use  Baseline: up to 8/10 Goal status: INITIAL   PLAN: PT FREQUENCY: 1-2x/week  PT DURATION: 8 weeks  PLANNED INTERVENTIONS: Therapeutic exercises, Therapeutic activity, Neuromuscular re-education, Patient/Family education, Joint mobilization, Dry Needling, Cryotherapy, Moist heat, scar mobilization, Taping, and Manual therapy  PLAN FOR NEXT SESSION: review HEP, DN to Rt shoulder    Sigurd Sos, PT 09/13/21 1:22 PM  [facility] [address] [place], [place] [address] Phone #  [phone] Fax [phone]

## 2021-09-13 ENCOUNTER — Ambulatory Visit: Payer: BC Managed Care – PPO | Attending: Hematology and Oncology

## 2021-09-13 DIAGNOSIS — M25511 Pain in right shoulder: Secondary | ICD-10-CM | POA: Diagnosis present

## 2021-09-13 DIAGNOSIS — M25611 Stiffness of right shoulder, not elsewhere classified: Secondary | ICD-10-CM | POA: Diagnosis not present

## 2021-09-13 DIAGNOSIS — G8929 Other chronic pain: Secondary | ICD-10-CM | POA: Diagnosis not present

## 2021-09-13 DIAGNOSIS — R252 Cramp and spasm: Secondary | ICD-10-CM | POA: Diagnosis not present

## 2021-09-13 NOTE — Patient Instructions (Signed)

## 2021-09-20 ENCOUNTER — Ambulatory Visit: Payer: BC Managed Care – PPO | Attending: Hematology and Oncology

## 2021-09-20 DIAGNOSIS — R252 Cramp and spasm: Secondary | ICD-10-CM | POA: Diagnosis present

## 2021-09-20 DIAGNOSIS — M25611 Stiffness of right shoulder, not elsewhere classified: Secondary | ICD-10-CM | POA: Insufficient documentation

## 2021-09-20 DIAGNOSIS — G8929 Other chronic pain: Secondary | ICD-10-CM | POA: Diagnosis present

## 2021-09-20 DIAGNOSIS — M25511 Pain in right shoulder: Secondary | ICD-10-CM | POA: Insufficient documentation

## 2021-09-20 NOTE — Therapy (Signed)
OUTPATIENT PHYSICAL THERAPY TREATMENT   Patient Name: Michelle Anthony MRN: [id number] DOB:[birth year], 56 y.o., female Today's Date: 09/20/2021   PT End of Session - 09/20/21 1314     Visit Number 2    Authorization Type BCBS    PT Start Time 1233    PT Stop Time 8657    PT Time Calculation (min) 40 min    Activity Tolerance Patient tolerated treatment well    Behavior During Therapy WFL for tasks assessed/performed              Past Medical History:  Diagnosis Date   Erythema nodosum    Family history of breast cancer    Family history of melanoma    Frequent UTI    Frozen shoulder    History of radiation therapy 06/23/20-07/22/20   IMRT to Right Breast, Dr. Gery Pray    STD (sexually transmitted disease)    H/O HSV II   Past Surgical History:  Procedure Laterality Date   BOTOX INJECTION     BREAST LUMPECTOMY WITH RADIOACTIVE SEED AND SENTINEL LYMPH NODE BIOPSY Right 05/17/2020   Procedure: RIGHT BREAST LUMPECTOMY WITH RADIOACTIVE SEED x5 AND SENTINEL LYMPH NODE MAPPING;  Surgeon: Erroll Luna, MD;  Location: [facility];  Service: General;  Laterality: Right;   CLOSED MANIPULATION SHOULDER     PORTACATH PLACEMENT Right 01/05/2020   Procedure: INSERTION PORT-A-CATH WITH ULTRASOUND GUIDANCE;  Surgeon: Erroll Luna, MD;  Location: [facility];  Service: General;  Laterality: Right;   Patient Active Problem List   Diagnosis Date Noted   Family history of malignant neoplasm of gastrointestinal tract 08/30/2020   Colon cancer screening 08/30/2020   Port-A-Cath in place 01/13/2020   Genetic testing 01/01/2020   Family history of breast cancer    Family history of melanoma    Malignant neoplasm of upper-outer quadrant of right breast in female, estrogen receptor positive ([place]) 12/16/2019   Abnormal mammogram 12/16/2019   Frozen shoulder 12/16/2018   Arthritis of carpometacarpal ([place]) joint of thumb 11/01/2017   Fatigue 11/01/2017     PCP: Suzanna Obey, MD  REFERRING PROVIDER: Nicholas Lose, MD  REFERRING DIAG: Right shoulder pain, unspecified chronicity  - Primary  THERAPY DIAG:  Chronic right shoulder pain  Stiffness of right shoulder, not elsewhere classified  Cramp and spasm  Rationale for Evaluation and Treatment Rehabilitation  ONSET DATE: 1 year ago  SUBJECTIVE:  SUBJECTIVE STATEMENT: I haven't seen much change in my motion.  I haven't bought the pulleys yet.    PERTINENT HISTORY:  Rt breast cancer: radiation 06/23/20-07/22/20 Rt lumpectomy 05/2020 Lt frozen shoulder with manipulation  PAIN:  Are you having pain? Yes: NPRS scale: 0-8/10 Pain location: Rt shoulder and arm Pain description: shooting Aggravating factors: sleep at night, getting dressed, reaching overhead   Relieving factors: heat, rest, ice, massage   PRECAUTIONS: Other: Breast cancer and radiation to Rt breast  WEIGHT BEARING RESTRICTIONS No  FALLS:  Has patient fallen in last 6 months? No  LIVING ENVIRONMENT: Lives with: lives with their family  OCCUPATION: desk work  PLOF: Independent  PATIENT GOALS reduce Rt shoulder pain, improve Rt UE A/ROM  OBJECTIVE:   DIAGNOSTIC FINDINGS:  None   PATIENT SURVEYS:  FOTO 44 (goal is 28)  COGNITION: Overall cognitive status: Within functional limits for tasks assessed   SENSATION: WFL  POSTURE: No Significant postural limitations  PALPATION: Palpable trigger points and tension over lateral rib cage, pecs and lats on the Rt.  Global tenderness over Rt glenohumeral joint.  Reduced Rt glenohumeral joint mobility in all directions,    CERVICAL ROM:  WFLs without pain  UPPER EXTREMITY ROM:  Active ROM Right eval Left eval  Shoulder flexion 100 Full without pain   Shoulder extension    Shoulder abduction 65   Shoulder adduction    Shoulder extension    Shoulder internal rotation To Rt buttock   Shoulder external rotation To Rt upper trap   Elbow flexion    Elbow extension    Wrist flexion    Wrist extension    Wrist ulnar deviation    Wrist radial deviation    Wrist pronation    Wrist supination     (Blank rows = not tested) PROM Rt: flexion 100, ER 35, IR 25 with pain and hard end feel  UPPER EXTREMITY MMT:  MMT Right eval Left eval  Shoulder flexion 4-/5 5/5 throughout   Shoulder extension 4/5   Shoulder abduction 3+/5   Shoulder adduction    Shoulder extension    Shoulder internal rotation 4+/5   Shoulder external rotation 3+/5                                                (Blank rows = not tested)   TODAY'S TREATMENT:  Treatment on date: 09/20/21 HEP established: see below   Trigger Point Dry-Needling  Treatment instructions: Expect mild to moderate muscle soreness. S/S of pneumothorax if dry needled over a lung field, and to seek immediate medical attention should they occur. Patient verbalized understanding of these instructions and education.  Patient Consent Given: Yes Education handout provided: Yes Muscles treated: Rt lats along lateral rib cage, lats posterior to axilla and pecs Treatment response/outcome: twitch response   Skilled palpation and monitoring by PT during dry needling  Manual: P/ROM and manual elongation to lats and pecs  Treatment on date: 09/13/21 HEP established: see below   Trigger Point Dry-Needling  Treatment instructions: Expect mild to moderate muscle soreness. S/S of pneumothorax if dry needled over a lung field, and to seek immediate medical attention should they occur. Patient verbalized understanding of these instructions and education.  Patient Consent Given: Yes Education handout provided: Yes Muscles treated: Rt lats along lateral rib cage, lats posterior to axilla  and  pecs Treatment response/outcome: twitch response   Skilled palpation and monitoring by PT during dry needling   PATIENT EDUCATION:  Education details: Access Code: [email or web address] Person educated: Patient Education method: Explanation, Demonstration, and Handouts Education comprehension: verbalized understanding and returned demonstration   HOME EXERCISE PROGRAM: Access Code: [email or web address] URL: [email or web address] Date: 09/13/2021 Prepared by: Claiborne Billings  Exercises - Seated Shoulder Flexion AAROM with Pulley Behind  - 1 x daily - 7 x weekly - 3 sets - 10 reps - Seated Shoulder Scaption AAROM with Pulley at Side  - 1 x daily - 7 x weekly - 3 sets - 10 reps - Standing Shoulder Internal Rotation AAROM with Pulley  - 1 x daily - 7 x weekly - 3 sets - 10 reps - Supine Shoulder Flexion AAROM with Hands Clasped  - 3 x daily - 7 x weekly - 1 sets - 10 reps - 10 hold - Seated Shoulder Flexion Towel Slide at Table Top  - 3 x daily - 7 x weekly - 1 sets - 10 reps - 10 hold  ASSESSMENT:  CLINICAL IMPRESSION: First time follow-up after evaluation.  Pt denies any change in Rt UE mobility since the start of care although her muscles do feel less tense and tight.  Pt with significant tension and trigger points in Rt lats, pecs and posterior shoulder musculature.  Pt had good response to DN with good twitch response and improved tissue mobility after manual therapy and DN. Patient will benefit from skilled PT to address the below impairments and improve overall function.    OBJECTIVE IMPAIRMENTS decreased ROM, decreased strength, increased fascial restrictions, increased muscle spasms, impaired UE functional use, postural dysfunction, and pain.   ACTIVITY LIMITATIONS carrying, lifting, and transfers  PARTICIPATION LIMITATIONS: cleaning and laundry  PERSONAL FACTORS 1-2 comorbidities: Rt breast cancer with lumpectomy and node removal, Lt frozen shoulder   are also affecting patient's functional  outcome.   REHAB POTENTIAL: Good  CLINICAL DECISION MAKING: Stable/uncomplicated  EVALUATION COMPLEXITY: Low   GOALS: Goals reviewed with patient? Yes  SHORT TERM GOALS: Target date: 10/10/21   Be independent in initial HEP Baseline:  Goal status: INITIAL  2.  Demonstrate Rt shoulder A/ROM flexion to > or = to 110 degrees to improve overhead reaching  Baseline: 100 Goal status: INITIAL  3.  Demonstrate Rt shoulder IR A/ROM to L3 to improve dressing  Baseline: lateral gluteals  Goal status: INITIAL  4.  Report > or = to 25% reduction in Rt shoulder pain with dressing and reaching overhead  Baseline:  Goal status: INITIAL    LONG TERM GOALS: Target date: 11/08/21  Be independent in advanced HEP Baseline:  Goal status: INITIAL  2.  Improve FOTO to > or = to 68 Baseline: 44 Goal status: INITIAL  3.  Demonstrate Rt shoulder A/ROM flexion to > or = to 120 degrees to improve overhead reaching  Baseline: 100 Goal status: INITIAL  4.  Demonstrate Rt shoulder A/ROM IR to L1 to improve dressing  Baseline: lateral gluteals  Goal status: INITIAL  5.  Demonstrate Rt shoulder A/ROM ER to occiput to assist with self-care Baseline: Rt upper trap Goal status: INITIAL  6.  Report > or = to 60% reduction in Rt shoulder pain with use  Baseline: up to 8/10 Goal status: INITIAL   PLAN: PT FREQUENCY: 1-2x/week  PT DURATION: 8 weeks  PLANNED INTERVENTIONS: Therapeutic exercises, Therapeutic activity, Neuromuscular re-education, Patient/Family education, Joint mobilization, Dry Needling, Cryotherapy, Moist heat, scar mobilization,  Taping, and Manual therapy  PLAN FOR NEXT SESSION: review HEP, DN to Rt shoulder    Sigurd Sos, PT 09/20/21 1:15 PM  [facility] [address], [place], [place] [address] Phone # [phone] Fax [phone]

## 2021-09-25 ENCOUNTER — Inpatient Hospital Stay: Payer: BC Managed Care – PPO | Attending: Hematology and Oncology

## 2021-09-25 ENCOUNTER — Other Ambulatory Visit: Payer: Self-pay

## 2021-09-25 ENCOUNTER — Inpatient Hospital Stay: Payer: BC Managed Care – PPO | Admitting: Pharmacist

## 2021-09-25 DIAGNOSIS — Z79899 Other long term (current) drug therapy: Secondary | ICD-10-CM | POA: Insufficient documentation

## 2021-09-25 DIAGNOSIS — C50411 Malignant neoplasm of upper-outer quadrant of right female breast: Secondary | ICD-10-CM | POA: Insufficient documentation

## 2021-09-25 DIAGNOSIS — Z170 Estrogen receptor positive status [ER+]: Secondary | ICD-10-CM | POA: Diagnosis not present

## 2021-09-25 DIAGNOSIS — Z79811 Long term (current) use of aromatase inhibitors: Secondary | ICD-10-CM | POA: Diagnosis not present

## 2021-09-25 LAB — CMP (CANCER CENTER ONLY)
ALT: 26 U/L (ref 0–44)
AST: 22 U/L (ref 15–41)
Albumin: 4.6 g/dL (ref 3.5–5.0)
Alkaline Phosphatase: 68 U/L (ref 38–126)
Anion gap: 7 (ref 5–15)
BUN: 17 mg/dL (ref 6–20)
CO2: 30 mmol/L (ref 22–32)
Calcium: 10.1 mg/dL (ref 8.9–10.3)
Chloride: 103 mmol/L (ref 98–111)
Creatinine: 0.79 mg/dL (ref 0.44–1.00)
GFR, Estimated: >60 mL/min (ref >60)
Glucose, Bld: 107 mg/dL — ABNORMAL HIGH (ref 70–99)
Potassium: 3.5 mmol/L (ref 3.5–5.1)
Sodium: 140 mmol/L (ref 135–145)
Total Bilirubin: 0.4 mg/dL (ref 0.3–1.2)
Total Protein: 7.4 g/dL (ref 6.5–8.1)

## 2021-09-25 LAB — CBC WITH DIFFERENTIAL (CANCER CENTER ONLY)
Abs Immature Granulocytes: 0.02 10*3/uL (ref 0.00–0.07)
Basophils Absolute: 0 10*3/uL (ref 0.0–0.1)
Basophils Relative: 1 %
Eosinophils Absolute: 0.2 10*3/uL (ref 0.0–0.5)
Eosinophils Relative: 3 %
HCT: 39.6 % (ref 36.0–46.0)
Hemoglobin: 13.6 g/dL (ref 12.0–15.0)
Immature Granulocytes: 0 %
Lymphocytes Relative: 29 %
Lymphs Abs: 2.5 10*3/uL (ref 0.7–4.0)
MCH: 31.7 pg (ref 26.0–34.0)
MCHC: 34.3 g/dL (ref 30.0–36.0)
MCV: 92.3 fL (ref 80.0–100.0)
Monocytes Absolute: 0.7 10*3/uL (ref 0.1–1.0)
Monocytes Relative: 8 %
Neutro Abs: 5.1 10*3/uL (ref 1.7–7.7)
Neutrophils Relative %: 59 %
Platelet Count: 242 10*3/uL (ref 150–400)
RBC: 4.29 MIL/uL (ref 3.87–5.11)
RDW: 12.7 % (ref 11.5–15.5)
WBC Count: 8.5 10*3/uL (ref 4.0–10.5)
nRBC: 0 % (ref 0.0–0.2)

## 2021-09-25 MED ORDER — NERATINIB MALEATE 40 MG PO TABS: 200.0000 mg | ORAL_TABLET | Freq: Every day | ORAL | 0 refills | Status: DC

## 2021-09-25 NOTE — Progress Notes (Signed)
[place]       Telephone: [phone]?Fax: [phone]   Oncology Clinical Pharmacist Practitioner Progress Note  Michelle Anthony was contacted via in person visit to discuss her chemotherapy regimen for neratinib which they receive under the care of Dr. Nicholas Lose.    Current treatment regimen and start date Neratinib (07/27/21) Anastrozole (06/01/21)   Interval History She continues on neratinib 4 tablets (160 mg) by mouth daily on days 1 to 28 of a 28-day cycle. This is being given in combination with anastrozole. Therapy is planned to continue until 1 year of therapy in the extended adjuvant setting.   Response to Therapy Ms. Michelle Anthony is doing well.  She was seen today by clinical pharmacy as a follow-up to her neratinib management.  She last saw clinical pharmacy on 09/05/21 and Dr. Lindi Adie on 07/14/21.  At her last visit with clinical pharmacy, her potassium was slightly low at 3.4 mmol/L.  At that time we had discussed eating potassium rich foods and today her potassium has normalized.  She states that she is tolerating the neratinib well.  She had some nausea since we last spoke and did take prochlorperazine with success.  Her diarrhea continues to be controlled by loperamide.  She is taking 2 powder supplements by Lenor Coffin MD.  We discussed that herbal supplements are not regulated by the FDA and it is very difficult to assess what is in 1 bottle versus the next.  We also mentioned that there may be ingredients in the supplements that could make the neratinib work better or not work well enough.  She understands the risk.  We did discuss that if she does start to have new side effects when starting these herbal supplements that she should likely stop them.  She verbalized understanding of the plan.  She is in agreement to start neratinib 5 tablets once she gets done with jury duty, approximately after 10/09/21.  We have sent a new prescription for this dose of neratinib to  [facility] and she knows that she can start this dose when she is ready.  She will see Dr. Lindi Adie next with labs in 1 month on 10/24/21 and because she is starting a new dose of neratinib, clinical pharmacy will plan on seeing her with labs 4 weeks after that visit.. Labs, vitals, treatment parameters, and manufacturer guidelines assessing toxicity were reviewed with Michelle Anthony today. Based on these values, patient is in agreement to continue neratinib therapy at this time.  Allergies No Known Allergies  Vitals    09/25/2021   12:59 PM 09/05/2021    1:26 PM 09/04/2021    9:57 AM  Vitals with BMI  Height '5\' 5"'$  '5\' 5"'$    Weight 151 lbs 5 oz 151 lbs 8 oz 152 lbs  BMI 29.56 21.30   Systolic 865 784   Diastolic 74 58   Pulse 83 65      Laboratory Data    Latest Ref Rng & Units 09/25/2021   12:41 PM 09/05/2021    1:04 PM 08/07/2021    8:11 AM  CBC EXTENDED  WBC 4.0 - 10.5 K/uL 8.5  6.9  4.6   RBC 3.87 - 5.11 MIL/uL 4.29  4.14  4.45   Hemoglobin 12.0 - 15.0 g/dL 13.6  13.0  13.9   HCT 36.0 - 46.0 % 39.6  37.9  41.1   Platelets 150 - 400 K/uL 242  227  229   NEUT# 1.7 -  7.7 K/uL 5.1  3.9  2.0   Lymph# 0.7 - 4.0 K/uL 2.5  2.3  1.9        Latest Ref Rng & Units 09/25/2021   12:41 PM 09/05/2021    1:04 PM 08/07/2021    8:11 AM  CMP  Glucose 70 - 99 mg/dL 107  129  97   BUN 6 - 20 mg/dL '17  18  18   '$ Creatinine 0.44 - 1.00 mg/dL 0.79  0.73  0.77   Sodium 135 - 145 mmol/L 140  141  142   Potassium 3.5 - 5.1 mmol/L 3.5  3.4  3.7   Chloride 98 - 111 mmol/L 103  105  103   CO2 22 - 32 mmol/L '30  31  29   '$ Calcium 8.9 - 10.3 mg/dL 10.1  9.4  9.8   Total Protein 6.5 - 8.1 g/dL 7.4  7.0  7.5   Total Bilirubin 0.3 - 1.2 mg/dL 0.4  0.4  0.6   Alkaline Phos 38 - 126 U/L 68  65  71   AST 15 - 41 U/L '22  18  22   '$ ALT 0 - 44 U/L '26  25  22     '$ No results found for: "MG"   Adverse Effects Assessment Low potassium: Resolved Nausea: Using prochlorperazine as needed.  Also has  ondansetron as needed for Michelle Anthony states that her insurance only approved 8 tablets.  They approved 30 tablets of the prochlorperazine. Diarrhea: Using loperamide as needed  Adherence Assessment Michelle Anthony reports missing 0 doses over the past 4 weeks.   Reason for missed dose: N/A Patient was re-educated on importance of adherence.   Access Assessment Michelle Anthony is currently receiving her neratinib through [facility] concerns: None  Medication Reconciliation The patient's medication list was reviewed today with the patient?  Yes New medications or herbal supplements have recently been started?  2 herbal supplements as listed above.  These of been added to her medication list as unable to find Any medications have been discontinued?  No The medication list was updated and reconciled based on the patient's most recent medication list in the electronic medical record (EMR) including herbal products and OTC medications.   Medications Current Outpatient Medications  Medication Sig Dispense Refill   anastrozole (ARIMIDEX) 1 MG tablet Take 1 tablet (1 mg total) by mouth daily. 90 tablet 3   CALCIUM/MAGNESIUM/ZINC FORMULA PO Take 1,000 mg by mouth daily.     COLLAGEN PO Take by mouth daily. Pt takes two scoops of powder in morning and night in coffee and almond milk     Cyanocobalamin (VITAMIN B-12) 6000 MCG SUBL Place under the tongue daily.     Multiple Vitamin (MULTIVITAMIN) tablet Take 1 tablet by mouth daily.     prochlorperazine (COMPAZINE) 10 MG tablet Take 1 tablet (10 mg total) by mouth every 6 (six) hours as needed (Nausea or vomiting). 30 tablet 2   Tretinoin (RETIN-A EX) Apply topically.     UNABLE TO FIND Med Name: [employer]. One scoop daily     UNABLE TO FIND Med Name: Prebiothrive. One scoop daily     ibuprofen (ADVIL) 200 MG tablet Take 3 tablets (600 mg total) by mouth every 8 (eight) hours as needed. (Patient not taking: Reported  on 09/25/2021)     loperamide (IMODIUM) 2 MG capsule Take 2 tabs (4 mg) with first loose stool, then 1 tab (2 mg) with each  additional loose stool. Do not take more than 8 tabs (16 mg) in a 24-hour period. (Patient not taking: Reported on 09/25/2021) 30 capsule 2   Neratinib Maleate (NERLYNX) 40 MG tablet Take 5 tablets (200 mg total) by mouth daily. Take with food. Start when current prescription has been exhausted. 140 tablet 0   ondansetron (ZOFRAN) 8 MG tablet Take 1 tablet (8 mg total) by mouth every 8 (eight) hours as needed for nausea or vomiting. (Patient not taking: Reported on 09/25/2021) 30 tablet 2   No current facility-administered medications for this visit.    Drug-Drug Interactions (DDIs) DDIs were evaluated?  Yes, we have discussed in the past separating out calcium supplements with neratinib.  Michelle Anthony does do this.  We also discussed today, as noted above, that we are unable to fully assess interactions with herbal supplements and her current medications including neratinib. Significant DDIs?  Yes, previously discussed calcium and neratinib potential interaction.  Michelle Anthony does space this out appropriately. The patient was instructed to speak with their health care provider and/or the oral chemotherapy pharmacist before starting any new drug, including prescription or over the counter, natural / herbal products, or vitamins.  Supportive Care Diarrhea: She continues to use loperamide Nausea: Continues to use prochlorperazine Added herbal supplements: Instructed Michelle Anthony to monitor her side effects and if they change at all prior to her starting her new dose of neratinib, she should likely stop the herbal supplements that she has added.  She verbalized understanding of the plan  Dosing Assessment Hepatic adjustments needed?  No Renal adjustments needed?  No Toxicity adjustments needed?  No The current dosing regimen is appropriate to continue at this time.  Follow-Up  Plan Increase neratinib to 5 tablets (200 mg) daily with food.  She will start this when she finishes up jury duty Continue anastrozole daily Continue loperamide for diarrhea as needed, continue prochlorperazine for nausea as needed We will see Dr. Lindi Adie with labs on 10/24/21 We will see medical pharmacy with labs 4 weeks after the 10/24/21 visit (11/21/21).  If tolerating neratinib 5 tablets daily dose, may consider extending labs/visits to every other month or every 3 months.  Michelle Anthony participated in the discussion, expressed understanding, and voiced agreement with the above plan. All questions were answered to her satisfaction. The patient was advised to contact the clinic at [phone] with any questions or concerns prior to her return visit.   I spent 30 minutes assessing and educating the patient.  Raina Mina, RPH-CPP, 09/25/2021  2:05 PM   **Disclaimer: This note was dictated with voice recognition software. Similar sounding words can inadvertently be transcribed and this note may contain transcription errors which may not have been corrected upon publication of note.**

## 2021-09-27 ENCOUNTER — Ambulatory Visit: Payer: BC Managed Care – PPO

## 2021-09-27 DIAGNOSIS — M25611 Stiffness of right shoulder, not elsewhere classified: Secondary | ICD-10-CM

## 2021-09-27 DIAGNOSIS — M25511 Pain in right shoulder: Secondary | ICD-10-CM | POA: Diagnosis not present

## 2021-09-27 DIAGNOSIS — G8929 Other chronic pain: Secondary | ICD-10-CM

## 2021-09-27 DIAGNOSIS — R252 Cramp and spasm: Secondary | ICD-10-CM

## 2021-09-27 NOTE — Therapy (Signed)
OUTPATIENT PHYSICAL THERAPY TREATMENT   Patient Name: Michelle Anthony MRN: [id number] DOB:1966/03/16, 56 y.o., female Today's Date: 09/27/2021   PT End of Session - 09/27/21 1026     Visit Number 3    Date for PT Re-Evaluation 11/08/21    Authorization Type BCBS    PT Start Time 0932    PT Stop Time 1014    PT Time Calculation (min) 42 min    Activity Tolerance Patient tolerated treatment well    Behavior During Therapy WFL for tasks assessed/performed               Past Medical History:  Diagnosis Date   Erythema nodosum    Family history of breast cancer    Family history of melanoma    Frequent UTI    Frozen shoulder    History of radiation therapy 06/23/20-07/22/20   IMRT to Right Breast, Dr. Gery Pray    STD (sexually transmitted disease)    H/O HSV II   Past Surgical History:  Procedure Laterality Date   BOTOX INJECTION     BREAST LUMPECTOMY WITH RADIOACTIVE SEED AND SENTINEL LYMPH NODE BIOPSY Right 05/17/2020   Procedure: RIGHT BREAST LUMPECTOMY WITH RADIOACTIVE SEED x5 AND SENTINEL LYMPH NODE MAPPING;  Surgeon: Erroll Luna, MD;  Location: [facility];  Service: General;  Laterality: Right;   CLOSED MANIPULATION SHOULDER     PORTACATH PLACEMENT Right 01/05/2020   Procedure: INSERTION PORT-A-CATH WITH ULTRASOUND GUIDANCE;  Surgeon: Erroll Luna, MD;  Location: [facility];  Service: General;  Laterality: Right;   Patient Active Problem List   Diagnosis Date Noted   Family history of malignant neoplasm of gastrointestinal tract 08/30/2020   Colon cancer screening 08/30/2020   Port-A-Cath in place 01/13/2020   Genetic testing 01/01/2020   Family history of breast cancer    Family history of melanoma    Malignant neoplasm of upper-outer quadrant of right breast in female, estrogen receptor positive ([place]) 12/16/2019   Abnormal mammogram 12/16/2019   Frozen shoulder 12/16/2018   Arthritis of carpometacarpal ([place]) joint of  thumb 11/01/2017   Fatigue 11/01/2017    PCP: Suzanna Obey, MD  REFERRING PROVIDER: Nicholas Lose, MD  REFERRING DIAG: Right shoulder pain, unspecified chronicity  - Primary  THERAPY DIAG:  Chronic right shoulder pain  Stiffness of right shoulder, not elsewhere classified  Cramp and spasm  Rationale for Evaluation and Treatment Rehabilitation  ONSET DATE: 1 year ago  SUBJECTIVE:  SUBJECTIVE STATEMENT: I felt so much better after last session but it has tightened back up.  I had a massage yesterday and it hurt.   PERTINENT HISTORY:  Rt breast cancer: radiation 06/23/20-07/22/20 Rt lumpectomy 05/2020 Lt frozen shoulder with manipulation  PAIN:  Are you having pain? Yes: NPRS scale:  /10 Pain location: Rt shoulder and arm Pain description: shooting Aggravating factors: sleep at night, getting dressed, reaching overhead   Relieving factors: heat, rest, ice, massage   PRECAUTIONS: Other: Breast cancer and radiation to Rt breast  WEIGHT BEARING RESTRICTIONS No  FALLS:  Has patient fallen in last 6 months? No  LIVING ENVIRONMENT: Lives with: lives with their family  OCCUPATION: desk work  PLOF: Independent  PATIENT GOALS reduce Rt shoulder pain, improve Rt UE A/ROM  OBJECTIVE:   DIAGNOSTIC FINDINGS:  None   PATIENT SURVEYS:  FOTO 44 (goal is 22)  COGNITION: Overall cognitive status: Within functional limits for tasks assessed   SENSATION: WFL  POSTURE: No Significant postural limitations  PALPATION: Palpable trigger points and tension over lateral rib cage, pecs and lats on the Rt.  Global tenderness over Rt glenohumeral joint.  Reduced Rt glenohumeral joint mobility in all directions,    CERVICAL ROM:  WFLs without pain  UPPER EXTREMITY ROM:  Active ROM  Right eval Right  09/27/21 Left eval  Shoulder flexion 100 120 Full without pain  Shoulder extension     Shoulder abduction 65 83   Shoulder adduction     Shoulder extension     Shoulder internal rotation To Rt buttock    Shoulder external rotation To Rt upper trap 45   Elbow flexion     Elbow extension     Wrist flexion     Wrist extension     Wrist ulnar deviation     Wrist radial deviation     Wrist pronation     Wrist supination      (Blank rows = not tested) PROM Rt: flexion 100, ER 35, IR 25 with pain and hard end feel  UPPER EXTREMITY MMT:  MMT Right eval Left eval  Shoulder flexion 4-/5 5/5 throughout   Shoulder extension 4/5   Shoulder abduction 3+/5   Shoulder adduction    Shoulder extension    Shoulder internal rotation 4+/5   Shoulder external rotation 3+/5                                                (Blank rows = not tested)   TODAY'S TREATMENT:  Treatment on date: 09/27/21  Trigger Point Dry-Needling  Treatment instructions: Expect mild to moderate muscle soreness. S/S of pneumothorax if dry needled over a lung field, and to seek immediate medical attention should they occur. Patient verbalized understanding of these instructions and education.  Patient Consent Given: Yes Education handout provided: Yes Muscles treated: Rt lats along lateral rib cage, lats posterior to axilla and pecs Treatment response/outcome: twitch response   Skilled palpation and monitoring by PT during dry needling  Manual: P/ROM and manual elongation to lats and pecs  Treatment on date: 09/20/21 HEP established: see below   Trigger Point Dry-Needling  Treatment instructions: Expect mild to moderate muscle soreness. S/S of pneumothorax if dry needled over a lung field, and to seek immediate medical attention should they occur. Patient verbalized understanding of these instructions and education.  Patient Consent Given: Yes Education handout provided: Yes Muscles  treated: Rt lats along lateral rib cage, lats posterior to axilla and pecs Treatment response/outcome: twitch response   Skilled palpation and monitoring by PT during dry needling  Manual: P/ROM and manual elongation to lats and pecs  Treatment on date: 09/13/21 HEP established: see below   Trigger Point Dry-Needling  Treatment instructions: Expect mild to moderate muscle soreness. S/S of pneumothorax if dry needled over a lung field, and to seek immediate medical attention should they occur. Patient verbalized understanding of these instructions and education.  Patient Consent Given: Yes Education handout provided: Yes Muscles treated: Rt lats along lateral rib cage, lats posterior to axilla and pecs Treatment response/outcome: twitch response   Skilled palpation and monitoring by PT during dry needling   PATIENT EDUCATION:  Education details: Access Code: [email or web address] Person educated: Patient Education method: Explanation, Demonstration, and Handouts Education comprehension: verbalized understanding and returned demonstration   HOME EXERCISE PROGRAM: Access Code: [email or web address] URL: [email or web address] Date: 09/13/2021 Prepared by: Claiborne Billings  Exercises - Seated Shoulder Flexion AAROM with Pulley Behind  - 1 x daily - 7 x weekly - 3 sets - 10 reps - Seated Shoulder Scaption AAROM with Pulley at Side  - 1 x daily - 7 x weekly - 3 sets - 10 reps - Standing Shoulder Internal Rotation AAROM with Pulley  - 1 x daily - 7 x weekly - 3 sets - 10 reps - Supine Shoulder Flexion AAROM with Hands Clasped  - 3 x daily - 7 x weekly - 1 sets - 10 reps - 10 hold - Seated Shoulder Flexion Towel Slide at Table Top  - 3 x daily - 7 x weekly - 1 sets - 10 reps - 10 hold  ASSESSMENT:  CLINICAL IMPRESSION: Pt is making good gains with Rt shoulder ROM, see above for measures.  Pt is independent and compliant with HEP.  She will be traveling next week and will do her exercises while away.  Pt  with trigger points in Rt scapular musculature, lats and pecs and had good response to DN and manual therapy.  Patient will benefit from skilled PT to address the below impairments and improve overall function.    OBJECTIVE IMPAIRMENTS decreased ROM, decreased strength, increased fascial restrictions, increased muscle spasms, impaired UE functional use, postural dysfunction, and pain.   ACTIVITY LIMITATIONS carrying, lifting, and transfers  PARTICIPATION LIMITATIONS: cleaning and laundry  PERSONAL FACTORS 1-2 comorbidities: Rt breast cancer with lumpectomy and node removal, Lt frozen shoulder   are also affecting patient's functional outcome.   REHAB POTENTIAL: Good  CLINICAL DECISION MAKING: Stable/uncomplicated  EVALUATION COMPLEXITY: Low   GOALS: Goals reviewed with patient? Yes  SHORT TERM GOALS: Target date: 10/10/21   Be independent in initial HEP Baseline:  Goal status: MET (09/27/21)  2.  Demonstrate Rt shoulder A/ROM flexion to > or = to 110 degrees to improve overhead reaching  Baseline: 120 (09/27/21) Goal status: MET  3.  Demonstrate Rt shoulder IR A/ROM to L3 to improve dressing  Baseline: lateral gluteals  Goal status: INITIAL  4.  Report > or = to 25% reduction in Rt shoulder pain with dressing and reaching overhead  Baseline:  Goal status: In progress     LONG TERM GOALS: Target date: 11/08/21  Be independent in advanced HEP Baseline:  Goal status: INITIAL  2.  Improve FOTO to > or = to 68 Baseline: 44 Goal status: INITIAL  3.  Demonstrate Rt shoulder A/ROM  flexion to > or = to 120 degrees to improve overhead reaching  Baseline: 100 Goal status: INITIAL  4.  Demonstrate Rt shoulder A/ROM IR to L1 to improve dressing  Baseline: lateral gluteals  Goal status: INITIAL  5.  Demonstrate Rt shoulder A/ROM ER to occiput to assist with self-care Baseline: Rt upper trap Goal status: INITIAL  6.  Report > or = to 60% reduction in Rt shoulder pain with  use  Baseline: up to 8/10 Goal status: INITIAL   PLAN: PT FREQUENCY: 1-2x/week  PT DURATION: 8 weeks  PLANNED INTERVENTIONS: Therapeutic exercises, Therapeutic activity, Neuromuscular re-education, Patient/Family education, Joint mobilization, Dry Needling, Cryotherapy, Moist heat, scar mobilization, Taping, and Manual therapy  PLAN FOR NEXT SESSION: DN to Rt shoulder, scapula and manual to address joint and tissue mobility.      Sigurd Sos, PT 09/27/21 10:28 AM  [facility] [address], [place], [place] [address] Phone # [phone] Fax [phone]

## 2021-09-29 ENCOUNTER — Telehealth: Payer: Self-pay | Admitting: Hematology and Oncology

## 2021-09-29 NOTE — Telephone Encounter (Signed)
.  Called pt per 6/15 inbasket , Patient was unavailable, a message with appt time and date was left with number on file.

## 2021-10-03 ENCOUNTER — Ambulatory Visit: Payer: BC Managed Care – PPO | Admitting: Pharmacist

## 2021-10-03 ENCOUNTER — Other Ambulatory Visit: Payer: BC Managed Care – PPO

## 2021-10-05 ENCOUNTER — Ambulatory Visit: Payer: BC Managed Care – PPO

## 2021-10-05 ENCOUNTER — Other Ambulatory Visit: Payer: Self-pay | Admitting: Nurse Practitioner

## 2021-10-11 ENCOUNTER — Ambulatory Visit: Payer: BC Managed Care – PPO

## 2021-10-12 ENCOUNTER — Ambulatory Visit: Payer: BC Managed Care – PPO

## 2021-10-12 DIAGNOSIS — M25611 Stiffness of right shoulder, not elsewhere classified: Secondary | ICD-10-CM

## 2021-10-12 DIAGNOSIS — R252 Cramp and spasm: Secondary | ICD-10-CM

## 2021-10-12 DIAGNOSIS — G8929 Other chronic pain: Secondary | ICD-10-CM

## 2021-10-12 DIAGNOSIS — M25511 Pain in right shoulder: Secondary | ICD-10-CM | POA: Diagnosis not present

## 2021-10-12 NOTE — Therapy (Signed)
OUTPATIENT PHYSICAL THERAPY TREATMENT   Patient Name: Michelle Anthony MRN: [id number] DOB:06-01-1965, 56 y.o., female Today's Date: 10/12/2021   PT End of Session - 10/12/21 1318     Visit Number 4    Date for PT Re-Evaluation 11/08/21    Authorization Type BCBS    PT Start Time 1232    PT Stop Time 1314    PT Time Calculation (min) 42 min    Activity Tolerance Patient tolerated treatment well    Behavior During Therapy WFL for tasks assessed/performed                Past Medical History:  Diagnosis Date   Erythema nodosum    Family history of breast cancer    Family history of melanoma    Frequent UTI    Frozen shoulder    History of radiation therapy 06/23/20-07/22/20   IMRT to Right Breast, Dr. Gery Pray    STD (sexually transmitted disease)    H/O HSV II   Past Surgical History:  Procedure Laterality Date   BOTOX INJECTION     BREAST LUMPECTOMY WITH RADIOACTIVE SEED AND SENTINEL LYMPH NODE BIOPSY Right 05/17/2020   Procedure: RIGHT BREAST LUMPECTOMY WITH RADIOACTIVE SEED x5 AND SENTINEL LYMPH NODE MAPPING;  Surgeon: Erroll Luna, MD;  Location: [facility];  Service: General;  Laterality: Right;   CLOSED MANIPULATION SHOULDER     PORTACATH PLACEMENT Right 01/05/2020   Procedure: INSERTION PORT-A-CATH WITH ULTRASOUND GUIDANCE;  Surgeon: Erroll Luna, MD;  Location: [facility];  Service: General;  Laterality: Right;   Patient Active Problem List   Diagnosis Date Noted   Family history of malignant neoplasm of gastrointestinal tract 08/30/2020   Colon cancer screening 08/30/2020   Port-A-Cath in place 01/13/2020   Genetic testing 01/01/2020   Family history of breast cancer    Family history of melanoma    Malignant neoplasm of upper-outer quadrant of right breast in female, estrogen receptor positive ([place]) 12/16/2019   Abnormal mammogram 12/16/2019   Frozen shoulder 12/16/2018   Arthritis of carpometacarpal ([place]) joint of  thumb 11/01/2017   Fatigue 11/01/2017    PCP: Suzanna Obey, MD  REFERRING PROVIDER: Nicholas Lose, MD  REFERRING DIAG: Right shoulder pain, unspecified chronicity  - Primary  THERAPY DIAG:  Chronic right shoulder pain  Stiffness of right shoulder, not elsewhere classified  Cramp and spasm  Rationale for Evaluation and Treatment Rehabilitation  ONSET DATE: 1 year ago  SUBJECTIVE:  SUBJECTIVE STATEMENT: I missed you last week because having needling has helped me.  I am able to move my Rt arm better and could lay on the massage table with my arms out.  PERTINENT HISTORY:  Rt breast cancer: radiation 06/23/20-07/22/20 Rt lumpectomy 05/2020 Lt frozen shoulder with manipulation  PAIN:  Are you having pain? Yes: NPRS scale: max 6/10 Pain location: Rt shoulder and arm Pain description: shooting Aggravating factors: sleep at night, getting dressed, reaching overhead   Relieving factors: heat, rest, ice, massage   PRECAUTIONS: Other: Breast cancer and radiation to Rt breast  WEIGHT BEARING RESTRICTIONS No  FALLS:  Has patient fallen in last 6 months? No  LIVING ENVIRONMENT: Lives with: lives with their family  OCCUPATION: desk work  PLOF: Independent  PATIENT GOALS reduce Rt shoulder pain, improve Rt UE A/ROM  OBJECTIVE:   DIAGNOSTIC FINDINGS:  None   PATIENT SURVEYS:  FOTO 44 (goal is 4)  COGNITION: Overall cognitive status: Within functional limits for tasks assessed   SENSATION: WFL  POSTURE: No Significant postural limitations  PALPATION: Palpable trigger points and tension over lateral rib cage, pecs and lats on the Rt.  Global tenderness over Rt glenohumeral joint.  Reduced Rt glenohumeral joint mobility in all directions,    CERVICAL ROM:  WFLs without  pain  UPPER EXTREMITY ROM:  Active ROM Right eval Right  09/27/21 Right 10/12/21 Left eval  Shoulder flexion 100 120 133 Full without pain  Shoulder extension      Shoulder abduction 65 83 85   Shoulder adduction      Shoulder extension      Shoulder internal rotation To Rt buttock     Shoulder external rotation To Rt upper trap 45 55   Elbow flexion      Elbow extension      Wrist flexion      Wrist extension      Wrist ulnar deviation      Wrist radial deviation      Wrist pronation      Wrist supination       (Blank rows = not tested) PROM Rt: flexion 100, ER 35, IR 25 with pain and hard end feel  UPPER EXTREMITY MMT:  MMT Right eval Left eval  Shoulder flexion 4-/5 5/5 throughout   Shoulder extension 4/5   Shoulder abduction 3+/5   Shoulder adduction    Shoulder extension    Shoulder internal rotation 4+/5   Shoulder external rotation 3+/5                                                (Blank rows = not tested)   TODAY'S TREATMENT:  reatment on date: 10/12/21  Trigger Point Dry-Needling  Treatment instructions: Expect mild to moderate muscle soreness. S/S of pneumothorax if dry needled over a lung field, and to seek immediate medical attention should they occur. Patient verbalized understanding of these instructions and education.  Patient Consent Given: Yes Education handout provided: Yes Muscles treated: Rt lats along lateral rib cage, lats posterior to axilla and pecs Treatment response/outcome: twitch response   Skilled palpation and monitoring by PT during dry needling  Manual: P/ROM and manual elongation to lats and pecs  Treatment on date: 09/27/21  Trigger Point Dry-Needling  Treatment instructions: Expect mild to moderate muscle soreness. S/S of pneumothorax if dry needled over  a lung field, and to seek immediate medical attention should they occur. Patient verbalized understanding of these instructions and education.  Patient Consent  Given: Yes Education handout provided: Yes Muscles treated: Rt lats along lateral rib cage, lats posterior to axilla. Posterior deltoid, upper traps and pecs Treatment response/outcome: twitch response   Skilled palpation and monitoring by PT during dry needling  Manual: P/ROM and manual elongation to lats and pecs  Treatment on date: 09/20/21 HEP established: see below   Trigger Point Dry-Needling  Treatment instructions: Expect mild to moderate muscle soreness. S/S of pneumothorax if dry needled over a lung field, and to seek immediate medical attention should they occur. Patient verbalized understanding of these instructions and education.  Patient Consent Given: Yes Education handout provided: Yes Muscles treated: Rt lats along lateral rib cage, lats posterior to axilla and pecs Treatment response/outcome: twitch response   Skilled palpation and monitoring by PT during dry needling  Manual: P/ROM and manual elongation to lats and pecs   PATIENT EDUCATION:  Education details: Access Code: [email or web address] Person educated: Patient Education method: Explanation, Demonstration, and Handouts Education comprehension: verbalized understanding and returned demonstration   HOME EXERCISE PROGRAM: Access Code: [email or web address] URL: [email or web address] Date: 09/13/2021 Prepared by: Claiborne Billings  Exercises - Seated Shoulder Flexion AAROM with Pulley Behind  - 1 x daily - 7 x weekly - 3 sets - 10 reps - Seated Shoulder Scaption AAROM with Pulley at Side  - 1 x daily - 7 x weekly - 3 sets - 10 reps - Standing Shoulder Internal Rotation AAROM with Pulley  - 1 x daily - 7 x weekly - 3 sets - 10 reps - Supine Shoulder Flexion AAROM with Hands Clasped  - 3 x daily - 7 x weekly - 1 sets - 10 reps - 10 hold - Seated Shoulder Flexion Towel Slide at Table Top  - 3 x daily - 7 x weekly - 1 sets - 10 reps - 10 hold  ASSESSMENT:  CLINICAL IMPRESSION: Pt is making good gains with Rt shoulder ROM again  this visit, see above for measures.  Pt is independent and compliant with HEP.  Pt with trigger points in Rt scapular musculature, lats and pecs and had good response to DN with multiple twitch responses and improved tissue mobility and manual therapy. Pt reports increased ease with dressing and is able to move her arm out to the side when on the massage table.  Patient will benefit from skilled PT to address the below impairments and improve overall function.    OBJECTIVE IMPAIRMENTS decreased ROM, decreased strength, increased fascial restrictions, increased muscle spasms, impaired UE functional use, postural dysfunction, and pain.   ACTIVITY LIMITATIONS carrying, lifting, and transfers  PARTICIPATION LIMITATIONS: cleaning and laundry  PERSONAL FACTORS 1-2 comorbidities: Rt breast cancer with lumpectomy and node removal, Lt frozen shoulder   are also affecting patient's functional outcome.   REHAB POTENTIAL: Good  CLINICAL DECISION MAKING: Stable/uncomplicated  EVALUATION COMPLEXITY: Low   GOALS: Goals reviewed with patient? Yes  SHORT TERM GOALS: Target date: 10/10/21   Be independent in initial HEP Baseline:  Goal status: MET (09/27/21)  2.  Demonstrate Rt shoulder A/ROM flexion to > or = to 110 degrees to improve overhead reaching  Baseline: 120 (09/27/21) Goal status: MET  3.  Demonstrate Rt shoulder IR A/ROM to L3 to improve dressing  Baseline: lateral gluteals  Goal status: INITIAL  4.  Report > or = to 25% reduction in Rt shoulder pain with dressing  and reaching overhead  Baseline:  Goal status: MET (10/12/21)    LONG TERM GOALS: Target date: 11/08/21  Be independent in advanced HEP Baseline:  Goal status: INITIAL  2.  Improve FOTO to > or = to 68 Baseline: 44 Goal status: INITIAL  3.  Demonstrate Rt shoulder A/ROM flexion to > or = to 120 degrees to improve overhead reaching  Baseline: P/ROM to 133 (10/12/21) Goal status: INITIAL  4.  Demonstrate Rt shoulder  A/ROM IR to L1 to improve dressing  Baseline: lateral gluteals  Goal status: INITIAL  5.  Demonstrate Rt shoulder A/ROM ER to occiput to assist with self-care Baseline: Rt upper trap Goal status: INITIAL  6.  Report > or = to 60% reduction in Rt shoulder pain with use  Baseline:  Goal status: in progress    PLAN: PT FREQUENCY: 1-2x/week  PT DURATION: 8 weeks  PLANNED INTERVENTIONS: Therapeutic exercises, Therapeutic activity, Neuromuscular re-education, Patient/Family education, Joint mobilization, Dry Needling, Cryotherapy, Moist heat, scar mobilization, Taping, and Manual therapy  PLAN FOR NEXT SESSION: DN to Rt shoulder, scapula and manual to address joint and tissue mobility.   Measure A/ROM in sitting and behind back   Sigurd Sos, PT 10/12/21 1:19 PM  [facility] [address], [place], [place] [address] Phone # [phone] Fax [phone]

## 2021-10-15 ENCOUNTER — Other Ambulatory Visit: Payer: Self-pay | Admitting: Hematology and Oncology

## 2021-10-15 DIAGNOSIS — Z170 Estrogen receptor positive status [ER+]: Secondary | ICD-10-CM

## 2021-10-18 ENCOUNTER — Ambulatory Visit: Payer: BC Managed Care – PPO | Attending: Hematology and Oncology

## 2021-10-18 DIAGNOSIS — Z483 Aftercare following surgery for neoplasm: Secondary | ICD-10-CM | POA: Diagnosis present

## 2021-10-18 DIAGNOSIS — M25511 Pain in right shoulder: Secondary | ICD-10-CM | POA: Insufficient documentation

## 2021-10-18 DIAGNOSIS — R252 Cramp and spasm: Secondary | ICD-10-CM | POA: Diagnosis present

## 2021-10-18 DIAGNOSIS — M25611 Stiffness of right shoulder, not elsewhere classified: Secondary | ICD-10-CM | POA: Insufficient documentation

## 2021-10-18 DIAGNOSIS — G8929 Other chronic pain: Secondary | ICD-10-CM | POA: Diagnosis present

## 2021-10-18 NOTE — Therapy (Signed)
OUTPATIENT PHYSICAL THERAPY TREATMENT   Patient Name: Michelle Anthony MRN: [id number] DOB:07/11/1965, 56 y.o., female Today's Date: 10/18/2021   PT End of Session - 10/18/21 1313     Visit Number 5    Date for PT Re-Evaluation 11/08/21    Authorization Type BCBS    PT Start Time 1234    PT Stop Time 1315    PT Time Calculation (min) 41 min    Activity Tolerance Patient tolerated treatment well    Behavior During Therapy WFL for tasks assessed/performed                 Past Medical History:  Diagnosis Date   Erythema nodosum    Family history of breast cancer    Family history of melanoma    Frequent UTI    Frozen shoulder    History of radiation therapy 06/23/20-07/22/20   IMRT to Right Breast, Dr. Gery Pray    STD (sexually transmitted disease)    H/O HSV II   Past Surgical History:  Procedure Laterality Date   BOTOX INJECTION     BREAST LUMPECTOMY WITH RADIOACTIVE SEED AND SENTINEL LYMPH NODE BIOPSY Right 05/17/2020   Procedure: RIGHT BREAST LUMPECTOMY WITH RADIOACTIVE SEED x5 AND SENTINEL LYMPH NODE MAPPING;  Surgeon: Erroll Luna, MD;  Location: [facility];  Service: General;  Laterality: Right;   CLOSED MANIPULATION SHOULDER     PORTACATH PLACEMENT Right 01/05/2020   Procedure: INSERTION PORT-A-CATH WITH ULTRASOUND GUIDANCE;  Surgeon: Erroll Luna, MD;  Location: [facility];  Service: General;  Laterality: Right;   Patient Active Problem List   Diagnosis Date Noted   Family history of malignant neoplasm of gastrointestinal tract 08/30/2020   Colon cancer screening 08/30/2020   Port-A-Cath in place 01/13/2020   Genetic testing 01/01/2020   Family history of breast cancer    Family history of melanoma    Malignant neoplasm of upper-outer quadrant of right breast in female, estrogen receptor positive ([place]) 12/16/2019   Abnormal mammogram 12/16/2019   Frozen shoulder 12/16/2018   Arthritis of carpometacarpal ([place]) joint  of thumb 11/01/2017   Fatigue 11/01/2017    PCP: Suzanna Obey, MD  REFERRING PROVIDER: Nicholas Lose, MD  REFERRING DIAG: Right shoulder pain, unspecified chronicity  - Primary  THERAPY DIAG:  Chronic right shoulder pain  Stiffness of right shoulder, not elsewhere classified  Cramp and spasm  Rationale for Evaluation and Treatment Rehabilitation  ONSET DATE: 1 year ago  SUBJECTIVE:  SUBJECTIVE STATEMENT: I missed you last week because having needling has helped me.  I am able to move my Rt arm better and could lay on the massage table with my arms out.  PERTINENT HISTORY:  Rt breast cancer: radiation 06/23/20-07/22/20 Rt lumpectomy 05/2020 Lt frozen shoulder with manipulation  PAIN:  Are you having pain? Yes: NPRS scale: max 6/10 Pain location: Rt shoulder and arm Pain description: shooting Aggravating factors: sleep at night, getting dressed, reaching overhead   Relieving factors: heat, rest, ice, massage   PRECAUTIONS: Other: Breast cancer and radiation to Rt breast  WEIGHT BEARING RESTRICTIONS No  FALLS:  Has patient fallen in last 6 months? No  LIVING ENVIRONMENT: Lives with: lives with their family  OCCUPATION: desk work  PLOF: Independent  PATIENT GOALS reduce Rt shoulder pain, improve Rt UE A/ROM  OBJECTIVE:   DIAGNOSTIC FINDINGS:  None   PATIENT SURVEYS:  FOTO 44 (goal is 61)  COGNITION: Overall cognitive status: Within functional limits for tasks assessed   SENSATION: WFL  POSTURE: No Significant postural limitations  PALPATION: Palpable trigger points and tension over lateral rib cage, pecs and lats on the Rt.  Global tenderness over Rt glenohumeral joint.  Reduced Rt glenohumeral joint mobility in all directions,    CERVICAL ROM:  WFLs  without pain  UPPER EXTREMITY ROM:  Active ROM Right eval Right  09/27/21 Right 10/12/21 Right  10/18/21 Left eval  Shoulder flexion 100 120 133 Seated 105,supine 133 Full without pain  Shoulder extension       Shoulder abduction 65 83 85    Shoulder adduction       Shoulder extension       Shoulder internal rotation To Rt buttock   L4    Shoulder external rotation To Rt upper trap 45 55    Elbow flexion       Elbow extension       Wrist flexion       Wrist extension       Wrist ulnar deviation       Wrist radial deviation       Wrist pronation       Wrist supination        (Blank rows = not tested) PROM Rt: flexion 100, ER 35, IR 25 with pain and hard end feel  UPPER EXTREMITY MMT:  MMT Right eval Left eval  Shoulder flexion 4-/5 5/5 throughout   Shoulder extension 4/5   Shoulder abduction 3+/5   Shoulder adduction    Shoulder extension    Shoulder internal rotation 4+/5   Shoulder external rotation 3+/5                                                (Blank rows = not tested)   TODAY'S TREATMENT:  Treatment on date: 10/18/21  Trigger Point Dry-Needling  Treatment instructions: Expect mild to moderate muscle soreness. S/S of pneumothorax if dry needled over a lung field, and to seek immediate medical attention should they occur. Patient verbalized understanding of these instructions and education.  Patient Consent Given: Yes Education handout provided: Yes Muscles treated: Rt lats along lateral rib cage, lats posterior to axilla and pecs, rhomboids and upper trap Treatment response/outcome: twitch response   Skilled palpation and monitoring by PT during dry needling  Manual: P/ROM and manual elongation to lats and pecs,  and rhomboids   Treatment on date: 10/12/21  Trigger Point Dry-Needling  Treatment instructions: Expect mild to moderate muscle soreness. S/S of pneumothorax if dry needled over a lung field, and to seek immediate medical attention  should they occur. Patient verbalized understanding of these instructions and education.  Patient Consent Given: Yes Education handout provided: Yes Muscles treated: Rt lats along lateral rib cage, lats posterior to axilla and pecs Treatment response/outcome: twitch response   Skilled palpation and monitoring by PT during dry needling  Manual: P/ROM and manual elongation to lats and pecs  Treatment on date: 09/27/21  Trigger Point Dry-Needling  Treatment instructions: Expect mild to moderate muscle soreness. S/S of pneumothorax if dry needled over a lung field, and to seek immediate medical attention should they occur. Patient verbalized understanding of these instructions and education.  Patient Consent Given: Yes Education handout provided: Yes Muscles treated: Rt lats along lateral rib cage, lats posterior to axilla. Posterior deltoid, upper traps and pecs Treatment response/outcome: twitch response   Skilled palpation and monitoring by PT during dry needling  Manual: P/ROM and manual elongation to lats and pecs    PATIENT EDUCATION:  Education details: Access Code: [email or web address] Person educated: Patient Education method: Explanation, Demonstration, and Handouts Education comprehension: verbalized understanding and returned demonstration   HOME EXERCISE PROGRAM: Access Code: [email or web address] URL: [email or web address] Date: 09/13/2021 Prepared by: Claiborne Billings  Exercises - Seated Shoulder Flexion AAROM with Pulley Behind  - 1 x daily - 7 x weekly - 3 sets - 10 reps - Seated Shoulder Scaption AAROM with Pulley at Side  - 1 x daily - 7 x weekly - 3 sets - 10 reps - Standing Shoulder Internal Rotation AAROM with Pulley  - 1 x daily - 7 x weekly - 3 sets - 10 reps - Supine Shoulder Flexion AAROM with Hands Clasped  - 3 x daily - 7 x weekly - 1 sets - 10 reps - 10 hold - Seated Shoulder Flexion Towel Slide at Table Top  - 3 x daily - 7 x weekly - 1 sets - 10 reps - 10  hold  ASSESSMENT:  CLINICAL IMPRESSION: Pt is making good gains with Rt shoulder ROM with improved IR on the Rt.   Pt is independent and compliant with HEP.  Pt with trigger points in Rt scapular musculature, lats and pecs and had good response to DN with multiple twitch responses and improved tissue mobility and manual therapy. Pt reports increased scapular pain over the past week without cause. Patient will benefit from skilled PT to address the below impairments and improve overall function.    OBJECTIVE IMPAIRMENTS decreased ROM, decreased strength, increased fascial restrictions, increased muscle spasms, impaired UE functional use, postural dysfunction, and pain.   ACTIVITY LIMITATIONS carrying, lifting, and transfers  PARTICIPATION LIMITATIONS: cleaning and laundry  PERSONAL FACTORS 1-2 comorbidities: Rt breast cancer with lumpectomy and node removal, Lt frozen shoulder   are also affecting patient's functional outcome.   REHAB POTENTIAL: Good  CLINICAL DECISION MAKING: Stable/uncomplicated  EVALUATION COMPLEXITY: Low   GOALS: Goals reviewed with patient? Yes  SHORT TERM GOALS: Target date: 10/10/21   Be independent in initial HEP Baseline:  Goal status: MET (09/27/21)  2.  Demonstrate Rt shoulder A/ROM flexion to > or = to 110 degrees to improve overhead reaching  Baseline: 120 (09/27/21) Goal status: MET  3.  Demonstrate Rt shoulder IR A/ROM to L3 to improve dressing  Baseline: lateral gluteals  Goal status: INITIAL  4.  Report >  or = to 25% reduction in Rt shoulder pain with dressing and reaching overhead  Baseline:  Goal status: MET (10/12/21)    LONG TERM GOALS: Target date: 11/08/21  Be independent in advanced HEP Baseline:  Goal status: INITIAL  2.  Improve FOTO to > or = to 68 Baseline: 44 Goal status: INITIAL  3.  Demonstrate Rt shoulder A/ROM flexion to > or = to 120 degrees to improve overhead reaching  Baseline: P/ROM to 133 (10/12/21) Goal  status: INITIAL  4.  Demonstrate Rt shoulder A/ROM IR to L1 to improve dressing  Baseline: L4 (10/18/21) Goal status: In progress   5.  Demonstrate Rt shoulder A/ROM ER to occiput to assist with self-care Baseline: Rt upper trap Goal status: INITIAL  6.  Report > or = to 60% reduction in Rt shoulder pain with use  Baseline:  Goal status: in progress    PLAN: PT FREQUENCY: 1-2x/week  PT DURATION: 8 weeks  PLANNED INTERVENTIONS: Therapeutic exercises, Therapeutic activity, Neuromuscular re-education, Patient/Family education, Joint mobilization, Dry Needling, Cryotherapy, Moist heat, scar mobilization, Taping, and Manual therapy  PLAN FOR NEXT SESSION: DN to Rt shoulder, scapula and manual to address joint and tissue mobility.     Sigurd Sos, PT 10/18/21 1:28 PM  [facility] [address] [place], [place] [address] Phone # [phone] Fax [phone]

## 2021-10-23 ENCOUNTER — Other Ambulatory Visit: Payer: Self-pay | Admitting: *Deleted

## 2021-10-23 DIAGNOSIS — C50411 Malignant neoplasm of upper-outer quadrant of right female breast: Secondary | ICD-10-CM

## 2021-10-23 DIAGNOSIS — Z170 Estrogen receptor positive status [ER+]: Secondary | ICD-10-CM

## 2021-10-24 ENCOUNTER — Other Ambulatory Visit: Payer: Self-pay

## 2021-10-24 ENCOUNTER — Inpatient Hospital Stay: Payer: BC Managed Care – PPO

## 2021-10-24 ENCOUNTER — Inpatient Hospital Stay: Payer: BC Managed Care – PPO | Attending: Hematology and Oncology | Admitting: Hematology and Oncology

## 2021-10-24 DIAGNOSIS — C50411 Malignant neoplasm of upper-outer quadrant of right female breast: Secondary | ICD-10-CM

## 2021-10-24 DIAGNOSIS — M79601 Pain in right arm: Secondary | ICD-10-CM | POA: Insufficient documentation

## 2021-10-24 DIAGNOSIS — R197 Diarrhea, unspecified: Secondary | ICD-10-CM | POA: Diagnosis not present

## 2021-10-24 DIAGNOSIS — Z79899 Other long term (current) drug therapy: Secondary | ICD-10-CM | POA: Insufficient documentation

## 2021-10-24 DIAGNOSIS — M25511 Pain in right shoulder: Secondary | ICD-10-CM | POA: Insufficient documentation

## 2021-10-24 DIAGNOSIS — R110 Nausea: Secondary | ICD-10-CM | POA: Insufficient documentation

## 2021-10-24 DIAGNOSIS — Z170 Estrogen receptor positive status [ER+]: Secondary | ICD-10-CM | POA: Diagnosis not present

## 2021-10-24 DIAGNOSIS — Z79811 Long term (current) use of aromatase inhibitors: Secondary | ICD-10-CM | POA: Insufficient documentation

## 2021-10-24 LAB — CMP (CANCER CENTER ONLY)
ALT: 28 U/L (ref 0–44)
AST: 25 U/L (ref 15–41)
Albumin: 4.5 g/dL (ref 3.5–5.0)
Alkaline Phosphatase: 68 U/L (ref 38–126)
Anion gap: 7 (ref 5–15)
BUN: 17 mg/dL (ref 6–20)
CO2: 28 mmol/L (ref 22–32)
Calcium: 9.8 mg/dL (ref 8.9–10.3)
Chloride: 105 mmol/L (ref 98–111)
Creatinine: 0.79 mg/dL (ref 0.44–1.00)
GFR, Estimated: >60 mL/min (ref >60)
Glucose, Bld: 93 mg/dL (ref 70–99)
Potassium: 3.6 mmol/L (ref 3.5–5.1)
Sodium: 140 mmol/L (ref 135–145)
Total Bilirubin: 0.6 mg/dL (ref 0.3–1.2)
Total Protein: 7.3 g/dL (ref 6.5–8.1)

## 2021-10-24 LAB — CBC WITH DIFFERENTIAL (CANCER CENTER ONLY)
Abs Immature Granulocytes: 0.01 10*3/uL (ref 0.00–0.07)
Basophils Absolute: 0 10*3/uL (ref 0.0–0.1)
Basophils Relative: 1 %
Eosinophils Absolute: 0.1 10*3/uL (ref 0.0–0.5)
Eosinophils Relative: 3 %
HCT: 39.1 % (ref 36.0–46.0)
Hemoglobin: 13.3 g/dL (ref 12.0–15.0)
Immature Granulocytes: 0 %
Lymphocytes Relative: 35 %
Lymphs Abs: 1.6 10*3/uL (ref 0.7–4.0)
MCH: 31.7 pg (ref 26.0–34.0)
MCHC: 34 g/dL (ref 30.0–36.0)
MCV: 93.1 fL (ref 80.0–100.0)
Monocytes Absolute: 0.4 10*3/uL (ref 0.1–1.0)
Monocytes Relative: 9 %
Neutro Abs: 2.5 10*3/uL (ref 1.7–7.7)
Neutrophils Relative %: 52 %
Platelet Count: 241 10*3/uL (ref 150–400)
RBC: 4.2 MIL/uL (ref 3.87–5.11)
RDW: 13 % (ref 11.5–15.5)
WBC Count: 4.7 10*3/uL (ref 4.0–10.5)
nRBC: 0 % (ref 0.0–0.2)

## 2021-10-24 NOTE — Assessment & Plan Note (Addendum)
12/16/2019:Screening mammogram showed a right breast asymmetry. Diagnostic mammogram showed a 2.9cm mass at the 9 o'clock position with 2 adjacent masses at the 10 and 8 o'clock positions, 0.9cm and 1.4cm respectively, and an enlarged lymph node, 1.3cm. 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 Neoadjuvant chemotherapy with TCH Perjeta 6 cycles followed by Herceptinand Perjeta versus Kadcylamaintenance for 1 yearcompleted 12/29/2020 2. 05/17/20:Right lumpectomy ([facility]): scattered foci of residual invasive lobular carcinoma, 0.6-1.0cm, grade 2, clear margins, 2 right axillary lymph nodes negative for carcinoma. 3.Adjuvant radiation therapycompleted April 2022 4.Adjuvant antiestrogen therapystarted 11/17/2020 switched from letrozole to anastrozole 06/01/2021 5.Followed by neratinib (started in February 2023) SWOG S 1714 neuropathy clinical trial: No adverse effects from participating in the trial --------------------------------------------------------------------------------------------------------------------------------------------- Anastrozoletoxicities:  1. Rt shoulder pain: Improving with dry needling 2. Patient would like to stay on the same dosage.  Neratinib toxicities:   1.  Diarrhea: Uses Imodium 2. nausea: Uses Compazine She is up to 5 tablets a day, we will continue with the same dosage.  Because she has lobular breast cancer, I recommended that we obtain a breast MRI every other year.  We will set this up for March 2024.  Return to clinic in 2 months to see Jenny Reichmann.

## 2021-10-24 NOTE — Progress Notes (Signed)
Patient Care Team: Katherina Mires, MD as PCP - General (Family Medicine) Rockwell Germany, RN as Oncology Nurse Navigator Tressie Ellis, Paulette Blanch, RN as Oncology Nurse Navigator Erroll Luna, MD as Consulting Physician (General Surgery) Nicholas Lose, MD as Consulting Physician (Hematology and Oncology) Gery Pray, MD as Consulting Physician (Radiation Oncology) Delice Bison Charlestine Massed, NP as Nurse Practitioner (Hematology and Oncology) Harmon Pier, RN as Registered Nurse  DIAGNOSIS:  Encounter Diagnosis  Name Primary?   Malignant neoplasm of upper-outer quadrant of right breast in female, estrogen receptor positive ([place])     SUMMARY OF ONCOLOGIC HISTORY: Oncology History  Malignant neoplasm of upper-outer quadrant of right breast in female, estrogen receptor positive ([place])  12/16/2019 Initial Diagnosis   Screening mammogram  showed a right breast asymmetry. Diagnostic mammogram showed a 2.9cm mass at the 9 o'clock position with 2 adjacent masses at the 10 and 8 o'clock positions, 0.9cm and 1.4cm respectively, and an enlarged lymph node, 1.3cm.  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 The report date is 12/31/2019.   The Multi-Cancer Panel offered by [facility] includes sequencing and/or deletion duplication testing of the following 85 genes: AIP, ALK, APC, ATM, AXIN2,BAP1,  BARD1, BLM, BMPR1A, BRCA1, BRCA2, BRIP1, CASR, CDC73, CDH1, CDK4, CDKN1B, CDKN1C, CDKN2A (p14ARF), CDKN2A (p16INK4a), CEBPA, CHEK2, CTNNA1, DICER1, DIS3L2, EGFR (c.2369C>T, p.Thr790Met variant only), EPCAM (Deletion/duplication testing only), FH, FLCN, GATA2, GPC3, GREM1 (Promoter region deletion/duplication testing only), HOXB13 (c.251G>A,  p.Gly84Glu), HRAS, KIT, MAX, MEN1, MET, MITF (c.952G>A, p.Glu318Lys variant only), MLH1, MSH2, MSH3, MSH6, MUTYH, NBN, NF1, NF2, NTHL1, PALB2, PDGFRA, PHOX2B, PMS2, POLD1, POLE, POT1, PRKAR1A, PTCH1, PTEN, RAD50, RAD51C, RAD51D, RB1, RECQL4, RET, RNF43, RUNX1, SDHAF2, SDHA (sequence changes only), SDHB, SDHC, SDHD, SMAD4, SMARCA4, SMARCB1, SMARCE1, STK11, SUFU, TERC, TERT, TMEM127, TP53, TSC1, TSC2, VHL, WRN and WT1.   01/06/2020 - 04/22/2020 Chemotherapy   Neoadjuvant chemo with TCH Perjeta x6 cycles       05/17/2020 Surgery   Right lumpectomy ([facility]): scattered foci of residual invasive lobular carcinoma, 0.6-1.0cm, grade 2, clear margins, 2 right axillary lymph nodes negative for carcinoma.   06/02/2020 - 12/29/2020 Chemotherapy   Patient is on Treatment Plan : BREAST ADO-Trastuzumab Emtansine (Kadcyla) q21d     06/24/2020 - 07/22/2020 Radiation Therapy   Adj XRT   11/17/2020 -  Anti-estrogen oral therapy   Letrozole 2.5 mg daily     CHIEF COMPLIANT: Follow-up of breast cancer on anastrozole    INTERVAL HISTORY: Michelle Anthony is a 56 y.o. with above-mentioned history of breast cancer treated with neoadjuvant chemotherapy, lumpectomy, and Kadcyla maintenance. She presents to the clinic today for a follow-up. She states that her potassium was low but she ate a banana. The neratinib went up to 5 on 10/12/21. She is still having some diarrhea one or two times that usually helps. She had some mild nausea. She has been trying to walk everyday. She has been tolerating the anastrozole just some pain under right arm.   ALLERGIES:  has No Known Allergies.  MEDICATIONS:  Current Outpatient Medications  Medication Sig Dispense Refill   anastrozole (ARIMIDEX) 1 MG tablet Take 1 tablet (1 mg total) by mouth daily. 90 tablet 3   CALCIUM/MAGNESIUM/ZINC FORMULA PO Take  1,000 mg by mouth daily.     COLLAGEN PO Take by mouth daily. Pt takes two scoops of powder in morning and night in coffee and almond  milk     Cyanocobalamin (VITAMIN B-12) 6000 MCG SUBL Place under the tongue daily.     ibuprofen (ADVIL) 200 MG tablet Take 3 tablets (600 mg total) by mouth every 8 (eight) hours as needed. (Patient not taking: Reported on 09/25/2021)     loperamide (IMODIUM) 2 MG capsule Take 2 tabs (4 mg) with first loose stool, then 1 tab (2 mg) with each additional loose stool. Do not take more than 8 tabs (16 mg) in a 24-hour period. (Patient not taking: Reported on 09/25/2021) 30 capsule 2   Multiple Vitamin (MULTIVITAMIN) tablet Take 1 tablet by mouth daily.     NERLYNX 40 MG tablet TAKE 5 TABLETS DAILY. TAKE WITH FOOD. START WHEN CURRENT PRESCRIPTION HAS BEEN EXHUASTED (TOTAL DOSE 200 MG) 140 tablet 0   ondansetron (ZOFRAN) 8 MG tablet Take 1 tablet (8 mg total) by mouth every 8 (eight) hours as needed for nausea or vomiting. (Patient not taking: Reported on 09/25/2021) 30 tablet 2   prochlorperazine (COMPAZINE) 10 MG tablet Take 1 tablet (10 mg total) by mouth every 6 (six) hours as needed (Nausea or vomiting). 30 tablet 2   Tretinoin (RETIN-A EX) Apply topically.     UNABLE TO FIND Med Name: [facility]. One scoop daily     UNABLE TO FIND Med Name: Prebiothrive. One scoop daily     No current facility-administered medications for this visit.    PHYSICAL EXAMINATION: ECOG PERFORMANCE STATUS: 1 - Symptomatic but completely ambulatory  Vitals:   10/24/21 0842  BP: 123/68  Pulse: 65  Resp: 18  Temp: 97.7 F (36.5 C)  SpO2: 100%   Filed Weights   10/24/21 0842  Weight: 153 lb (69.4 kg)      LABORATORY DATA:  I have reviewed the data as listed    Latest Ref Rng & Units 09/25/2021   12:41 PM 09/05/2021    1:04 PM 08/07/2021    8:11 AM  CMP  Glucose 70 - 99 mg/dL 107  129  97   BUN 6 - 20 mg/dL 17  18  18    Creatinine 0.44 - 1.00 mg/dL 0.79  0.73  0.77   Sodium 135 - 145 mmol/L 140  141  142   Potassium 3.5 - 5.1 mmol/L 3.5  3.4  3.7   Chloride 98 - 111 mmol/L 103  105  103   CO2 22 - 32  mmol/L 30  31  29    Calcium 8.9 - 10.3 mg/dL 10.1  9.4  9.8   Total Protein 6.5 - 8.1 g/dL 7.4  7.0  7.5   Total Bilirubin 0.3 - 1.2 mg/dL 0.4  0.4  0.6   Alkaline Phos 38 - 126 U/L 68  65  71   AST 15 - 41 U/L 22  18  22    ALT 0 - 44 U/L 26  25  22      Lab Results  Component Value Date   WBC 4.7 10/24/2021   HGB 13.3 10/24/2021   HCT 39.1 10/24/2021   MCV 93.1 10/24/2021   PLT 241 10/24/2021   NEUTROABS 2.5 10/24/2021    ASSESSMENT & PLAN:  Malignant neoplasm of upper-outer quadrant of right breast in female, estrogen receptor positive ([place]) 12/16/2019:Screening mammogram  showed a right breast asymmetry. Diagnostic mammogram showed a 2.9cm mass  at the 9 o'clock position with 2 adjacent masses at the 10 and 8 o'clock positions, 0.9cm and 1.4cm respectively, and an enlarged lymph node, 1.3cm.  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 Neoadjuvant chemotherapy with TCH Perjeta 6 cycles followed by Herceptin and Perjeta versus Kadcyla maintenance for 1 year completed 12/29/2020 2. 05/17/20: Right lumpectomy ([facility]): scattered foci of residual invasive lobular carcinoma, 0.6-1.0cm, grade 2, clear margins, 2 right axillary lymph nodes negative for carcinoma. 3. Adjuvant radiation therapy completed April 2022 4.  Adjuvant antiestrogen therapy started 11/17/2020 switched from letrozole to anastrozole 06/01/2021 5.  Followed by neratinib (started in February 2023) SWOG S 1714 neuropathy clinical trial: No adverse effects from participating in the trial ---------------------------------------------------------------------------------------------------------------------------------------------  Anastrozole toxicities:   Rt shoulder pain: Improving with dry needling Patient would like to stay on the same dosage.   Neratinib toxicities:   1.  Diarrhea:  Uses Imodium 2. nausea: Uses Compazine She is up to 5 tablets a day, we will continue with the same dosage.  Mammograms will be set up for September 2023. Because she has lobular breast cancer, I recommended that we obtain a breast MRI every other year.  We will set this up for March 2024.  Return to clinic in 2 months to see Jenny Reichmann.    Orders Placed This Encounter  Procedures   MR BREAST BILATERAL W WO CONTRAST INC CAD    Standing Status:   Future    Standing Expiration Date:   10/25/2022    Order Specific Question:   If indicated for the ordered procedure, I authorize the administration of contrast media per Radiology protocol    Answer:   Yes    Order Specific Question:   What is the patient's sedation requirement?    Answer:   No Sedation    Order Specific Question:   Does the patient have a pacemaker or implanted devices?    Answer:   No    Order Specific Question:   Preferred imaging location?    Answer:   [facility] (table limit-550lbs)    Order Specific Question:   Release to patient    Answer:   Immediate   The patient has a good understanding of the overall plan. she agrees with it. she will call with any problems that may develop before the next visit here. Total time spent: 30 mins including face to face time and time spent for planning, charting and co-ordination of care   Harriette Ohara, MD 10/24/21    I Gardiner Coins am scribing for Dr. Lindi Adie  I have reviewed the above documentation for accuracy and completeness, and I agree with the above.

## 2021-10-25 ENCOUNTER — Telehealth: Payer: Self-pay | Admitting: Hematology and Oncology

## 2021-10-25 ENCOUNTER — Ambulatory Visit: Payer: BC Managed Care – PPO

## 2021-10-25 NOTE — Telephone Encounter (Signed)
Scheduled appointment per 7/11 los. Patient is aware of the changes made to her August appointment as well as the new appointment made in November.

## 2021-10-26 ENCOUNTER — Ambulatory Visit: Payer: BC Managed Care – PPO

## 2021-10-26 DIAGNOSIS — M25611 Stiffness of right shoulder, not elsewhere classified: Secondary | ICD-10-CM

## 2021-10-26 DIAGNOSIS — R252 Cramp and spasm: Secondary | ICD-10-CM

## 2021-10-26 DIAGNOSIS — M25511 Pain in right shoulder: Secondary | ICD-10-CM | POA: Diagnosis not present

## 2021-10-26 DIAGNOSIS — G8929 Other chronic pain: Secondary | ICD-10-CM

## 2021-10-26 NOTE — Therapy (Signed)
OUTPATIENT PHYSICAL THERAPY TREATMENT   Patient Name: Michelle Anthony MRN: [id number] DOB:08-24-1965, 56 y.o., female Today's Date: 10/26/2021   PT End of Session - 10/26/21 1259     Visit Number 6    Date for PT Re-Evaluation 11/08/21    Authorization Type BCBS    PT Start Time 1225    PT Stop Time 1300    PT Time Calculation (min) 35 min    Activity Tolerance Patient tolerated treatment well    Behavior During Therapy WFL for tasks assessed/performed                  Past Medical History:  Diagnosis Date   Erythema nodosum    Family history of breast cancer    Family history of melanoma    Frequent UTI    Frozen shoulder    History of radiation therapy 06/23/20-07/22/20   IMRT to Right Breast, Dr. Gery Pray    STD (sexually transmitted disease)    H/O HSV II   Past Surgical History:  Procedure Laterality Date   BOTOX INJECTION     BREAST LUMPECTOMY WITH RADIOACTIVE SEED AND SENTINEL LYMPH NODE BIOPSY Right 05/17/2020   Procedure: RIGHT BREAST LUMPECTOMY WITH RADIOACTIVE SEED x5 AND SENTINEL LYMPH NODE MAPPING;  Surgeon: Erroll Luna, MD;  Location: [facility];  Service: General;  Laterality: Right;   CLOSED MANIPULATION SHOULDER     PORTACATH PLACEMENT Right 01/05/2020   Procedure: INSERTION PORT-A-CATH WITH ULTRASOUND GUIDANCE;  Surgeon: Erroll Luna, MD;  Location: [facility];  Service: General;  Laterality: Right;   Patient Active Problem List   Diagnosis Date Noted   Family history of malignant neoplasm of gastrointestinal tract 08/30/2020   Colon cancer screening 08/30/2020   Port-A-Cath in place 01/13/2020   Genetic testing 01/01/2020   Family history of breast cancer    Family history of melanoma    Malignant neoplasm of upper-outer quadrant of right breast in female, estrogen receptor positive ([place]) 12/16/2019   Abnormal mammogram 12/16/2019   Frozen shoulder 12/16/2018   Arthritis of carpometacarpal ([place])  joint of thumb 11/01/2017   Fatigue 11/01/2017    PCP: Suzanna Obey, MD  REFERRING PROVIDER: Nicholas Lose, MD  REFERRING DIAG: Right shoulder pain, unspecified chronicity  - Primary  THERAPY DIAG:  Chronic right shoulder pain  Stiffness of right shoulder, not elsewhere classified  Cramp and spasm  Rationale for Evaluation and Treatment Rehabilitation  ONSET DATE: 1 year ago  SUBJECTIVE:  SUBJECTIVE STATEMENT: I've been stretching.  MD is pleased with progress.    PERTINENT HISTORY:  Rt breast cancer: radiation 06/23/20-07/22/20 Rt lumpectomy 05/2020 Lt frozen shoulder with manipulation  PAIN:  Are you having pain? Yes: NPRS scale: 2/10 max/10 Pain location: Rt shoulder and arm Pain description: shooting Aggravating factors: sleep at night, getting dressed, reaching overhead   Relieving factors: heat, rest, ice, massage   PRECAUTIONS: Other: Breast cancer and radiation to Rt breast  WEIGHT BEARING RESTRICTIONS No  FALLS:  Has patient fallen in last 6 months? No  LIVING ENVIRONMENT: Lives with: lives with their family  OCCUPATION: desk work  PLOF: Independent  PATIENT GOALS reduce Rt shoulder pain, improve Rt UE A/ROM  OBJECTIVE:   DIAGNOSTIC FINDINGS:  None   PATIENT SURVEYS:  FOTO 44 (goal is 19)  COGNITION: Overall cognitive status: Within functional limits for tasks assessed   SENSATION: WFL  POSTURE: No Significant postural limitations  PALPATION: Palpable trigger points and tension over lateral rib cage, pecs and lats on the Rt.  Global tenderness over Rt glenohumeral joint.  Reduced Rt glenohumeral joint mobility in all directions,    CERVICAL ROM:  WFLs without pain  UPPER EXTREMITY ROM:  Active ROM Right eval Right  09/27/21  Right 10/12/21 Right  10/18/21 Left eval  Shoulder flexion 100 120 133 Seated 105,supine 133 Full without pain  Shoulder extension       Shoulder abduction 65 83 85    Shoulder adduction       Shoulder extension       Shoulder internal rotation To Rt buttock   L4    Shoulder external rotation To Rt upper trap 45 55    Elbow flexion       Elbow extension       Wrist flexion       Wrist extension       Wrist ulnar deviation       Wrist radial deviation       Wrist pronation       Wrist supination        (Blank rows = not tested) PROM Rt: flexion 100, ER 35, IR 25 with pain and hard end feel  UPPER EXTREMITY MMT:  MMT Right eval Left eval  Shoulder flexion 4-/5 5/5 throughout   Shoulder extension 4/5   Shoulder abduction 3+/5   Shoulder adduction    Shoulder extension    Shoulder internal rotation 4+/5   Shoulder external rotation 3+/5                                                (Blank rows = not tested)   TODAY'S TREATMENT:  Treatment on date: 10/26/21  Trigger Point Dry-Needling  Treatment instructions: Expect mild to moderate muscle soreness. S/S of pneumothorax if dry needled over a lung field, and to seek immediate medical attention should they occur. Patient verbalized understanding of these instructions and education.  Patient Consent Given: Yes Education handout provided: Yes Muscles treated: Rt lats along lateral rib cage, lats posterior to axilla and pecs, rhomboids and upper trap Treatment response/outcome: twitch response   Skilled palpation and monitoring by PT during dry needling  Manual: P/ROM and manual elongation to lats and pecs, and rhomboids  Treatment on date: 10/18/21  Trigger Point Dry-Needling  Treatment instructions: Expect mild to moderate muscle soreness. S/S  of pneumothorax if dry needled over a lung field, and to seek immediate medical attention should they occur. Patient verbalized understanding of these instructions and  education.  Patient Consent Given: Yes Education handout provided: Yes Muscles treated: Rt lats along lateral rib cage, lats posterior to axilla and pecs, rhomboids and upper trap Treatment response/outcome: twitch response   Skilled palpation and monitoring by PT during dry needling  Manual: P/ROM and manual elongation to lats and pecs, and rhomboids   Treatment on date: 10/12/21  Trigger Point Dry-Needling  Treatment instructions: Expect mild to moderate muscle soreness. S/S of pneumothorax if dry needled over a lung field, and to seek immediate medical attention should they occur. Patient verbalized understanding of these instructions and education.  Patient Consent Given: Yes Education handout provided: Yes Muscles treated: Rt lats along lateral rib cage, lats posterior to axilla and pecs Treatment response/outcome: twitch response   Skilled palpation and monitoring by PT during dry needling  Manual: P/ROM and manual elongation to lats and pecs     PATIENT EDUCATION:  Education details: Access Code: [email or web address] Person educated: Patient Education method: Explanation, Demonstration, and Handouts Education comprehension: verbalized understanding and returned demonstration   HOME EXERCISE PROGRAM: Access Code: [email or web address] URL: [email or web address] Date: 09/13/2021 Prepared by: Claiborne Billings  Exercises - Seated Shoulder Flexion AAROM with Pulley Behind  - 1 x daily - 7 x weekly - 3 sets - 10 reps - Seated Shoulder Scaption AAROM with Pulley at Side  - 1 x daily - 7 x weekly - 3 sets - 10 reps - Standing Shoulder Internal Rotation AAROM with Pulley  - 1 x daily - 7 x weekly - 3 sets - 10 reps - Supine Shoulder Flexion AAROM with Hands Clasped  - 3 x daily - 7 x weekly - 1 sets - 10 reps - 10 hold - Seated Shoulder Flexion Towel Slide at Table Top  - 3 x daily - 7 x weekly - 1 sets - 10 reps - 10 hold  ASSESSMENT:  CLINICAL IMPRESSION: Pt with reduced Rt shoulder pain  overall with 2/10 max reported today.  Pt with trigger points in Rt scapular musculature, lats and pecs and had good response to DN with multiple twitch responses and improved tissue mobility and manual therapy. Pt with improved tolerance with P/ROM today. PT encouraged use of pulleys more frequently at home.  Patient will benefit from skilled PT to address the below impairments and improve overall function.    OBJECTIVE IMPAIRMENTS decreased ROM, decreased strength, increased fascial restrictions, increased muscle spasms, impaired UE functional use, postural dysfunction, and pain.   ACTIVITY LIMITATIONS carrying, lifting, and transfers  PARTICIPATION LIMITATIONS: cleaning and laundry  PERSONAL FACTORS 1-2 comorbidities: Rt breast cancer with lumpectomy and node removal, Lt frozen shoulder   are also affecting patient's functional outcome.   REHAB POTENTIAL: Good  CLINICAL DECISION MAKING: Stable/uncomplicated  EVALUATION COMPLEXITY: Low   GOALS: Goals reviewed with patient? Yes  SHORT TERM GOALS: Target date: 10/10/21   Be independent in initial HEP Baseline:  Goal status: MET (09/27/21)  2.  Demonstrate Rt shoulder A/ROM flexion to > or = to 110 degrees to improve overhead reaching  Baseline: 120 (09/27/21) Goal status: MET  3.  Demonstrate Rt shoulder IR A/ROM to L3 to improve dressing  Baseline: lateral gluteals  Goal status: INITIAL  4.  Report > or = to 25% reduction in Rt shoulder pain with dressing and reaching overhead  Baseline:  Goal status: MET (10/12/21)  LONG TERM GOALS: Target date: 11/08/21  Be independent in advanced HEP Baseline:  Goal status: INITIAL  2.  Improve FOTO to > or = to 68 Baseline: 44 Goal status: INITIAL  3.  Demonstrate Rt shoulder A/ROM flexion to > or = to 120 degrees to improve overhead reaching  Baseline: P/ROM to 133 (10/12/21) Goal status: INITIAL  4.  Demonstrate Rt shoulder A/ROM IR to L1 to improve dressing  Baseline: L4  (10/18/21) Goal status: In progress   5.  Demonstrate Rt shoulder A/ROM ER to occiput to assist with self-care Baseline: Rt upper trap Goal status: INITIAL  6.  Report > or = to 60% reduction in Rt shoulder pain with use  Baseline:  Goal status: in progress    PLAN: PT FREQUENCY: 1-2x/week  PT DURATION: 8 weeks  PLANNED INTERVENTIONS: Therapeutic exercises, Therapeutic activity, Neuromuscular re-education, Patient/Family education, Joint mobilization, Dry Needling, Cryotherapy, Moist heat, scar mobilization, Taping, and Manual therapy  PLAN FOR NEXT SESSION: Rt shoulder ROM, DN and tissue mobility.  ERO next, measure and Ivor Reining, PT 10/26/21 1:03 PM  [facility] [address], [place],  [address] Phone # [phone] Fax [phone]

## 2021-10-31 ENCOUNTER — Ambulatory Visit: Payer: BC Managed Care – PPO

## 2021-10-31 DIAGNOSIS — M25611 Stiffness of right shoulder, not elsewhere classified: Secondary | ICD-10-CM

## 2021-10-31 DIAGNOSIS — Z483 Aftercare following surgery for neoplasm: Secondary | ICD-10-CM

## 2021-10-31 DIAGNOSIS — R252 Cramp and spasm: Secondary | ICD-10-CM

## 2021-10-31 DIAGNOSIS — M25511 Pain in right shoulder: Secondary | ICD-10-CM | POA: Diagnosis not present

## 2021-10-31 DIAGNOSIS — G8929 Other chronic pain: Secondary | ICD-10-CM

## 2021-10-31 NOTE — Therapy (Signed)
OUTPATIENT PHYSICAL THERAPY TREATMENT   Patient Name: Michelle Anthony MRN: [id number] DOB:25-Jan-1966, 56 y.o., female Today's Date: 10/31/2021   PT End of Session - 10/31/21 1237     Visit Number 7    Date for PT Re-Evaluation 12/29/21    Authorization Type BCBS    PT Start Time 1232    PT Stop Time 1316    PT Time Calculation (min) 44 min    Activity Tolerance Patient tolerated treatment well    Behavior During Therapy [facility] for tasks assessed/performed                   Past Medical History:  Diagnosis Date   Erythema nodosum    Family history of breast cancer    Family history of melanoma    Frequent UTI    Frozen shoulder    History of radiation therapy 06/23/20-07/22/20   IMRT to Right Breast, Dr. Gery Pray    STD (sexually transmitted disease)    H/O HSV II   Past Surgical History:  Procedure Laterality Date   BOTOX INJECTION     BREAST LUMPECTOMY WITH RADIOACTIVE SEED AND SENTINEL LYMPH NODE BIOPSY Right 05/17/2020   Procedure: RIGHT BREAST LUMPECTOMY WITH RADIOACTIVE SEED x5 AND SENTINEL LYMPH NODE MAPPING;  Surgeon: Erroll Luna, MD;  Location: [place];  Service: General;  Laterality: Right;   CLOSED MANIPULATION SHOULDER     PORTACATH PLACEMENT Right 01/05/2020   Procedure: INSERTION PORT-A-CATH WITH ULTRASOUND GUIDANCE;  Surgeon: Erroll Luna, MD;  Location: [place];  Service: General;  Laterality: Right;   Patient Active Problem List   Diagnosis Date Noted   Family history of malignant neoplasm of gastrointestinal tract 08/30/2020   Colon cancer screening 08/30/2020   Port-A-Cath in place 01/13/2020   Genetic testing 01/01/2020   Family history of breast cancer    Family history of melanoma    Malignant neoplasm of upper-outer quadrant of right breast in female, estrogen receptor positive ([place]) 12/16/2019   Abnormal mammogram 12/16/2019   Frozen shoulder 12/16/2018   Arthritis of carpometacarpal ([place])  joint of thumb 11/01/2017   Fatigue 11/01/2017    PCP: Suzanna Obey, MD  REFERRING PROVIDER: Nicholas Lose, MD  REFERRING DIAG: Right shoulder pain, unspecified chronicity  - Primary  THERAPY DIAG:  Chronic right shoulder pain - Plan: PT plan of care cert/re-cert  Stiffness of right shoulder, not elsewhere classified - Plan: PT plan of care cert/re-cert  Cramp and spasm - Plan: PT plan of care cert/re-cert  Aftercare following surgery for neoplasm - Plan: PT plan of care cert/re-cert  Rationale for Evaluation and Treatment Rehabilitation  ONSET DATE: 1 year ago  SUBJECTIVE:  SUBJECTIVE STATEMENT: I've been stretching.  MD is pleased with progress.    PERTINENT HISTORY:  Rt breast cancer: radiation 06/23/20-07/22/20 Rt lumpectomy 05/2020 Lt frozen shoulder with manipulation  PAIN:  Are you having pain? Yes: NPRS scale: 2/10 max/10 Pain location: Rt shoulder and arm Pain description: shooting Aggravating factors: sleep at night, getting dressed, reaching overhead   Relieving factors: heat, rest, ice, massage   PRECAUTIONS: Other: Breast cancer and radiation to Rt breast  WEIGHT BEARING RESTRICTIONS No  FALLS:  Has patient fallen in last 6 months? No  LIVING ENVIRONMENT: Lives with: lives with their family  OCCUPATION: desk work  PLOF: Independent  PATIENT GOALS reduce Rt shoulder pain, improve Rt UE A/ROM  OBJECTIVE:   DIAGNOSTIC FINDINGS:  None   PATIENT SURVEYS:  FOTO 44 (goal is 75) 10/31/21: 65 (goal is 20)  COGNITION: Overall cognitive status: Within functional limits for tasks assessed   SENSATION: WFL  POSTURE: No Significant postural limitations  PALPATION: Palpable trigger points and tension over lateral rib cage, pecs and lats on the Rt.   Global tenderness over Rt glenohumeral joint.  Reduced Rt glenohumeral joint mobility in all directions,    CERVICAL ROM:  WFLs without pain  UPPER EXTREMITY ROM:  Active ROM Right eval Right  09/27/21 Right 10/12/21 Right  10/18/21 Right  10/31/21 Left eval  Shoulder flexion 100 120 133 Seated 105,supine 133 118 active standing  130 P/ROM Full without pain  Shoulder extension        Shoulder abduction 65 83 85  85 A/ROM  90 P/ROM   Shoulder adduction        Shoulder extension        Shoulder internal rotation To Rt buttock   L4  L2   Shoulder external rotation To Rt upper trap 45 55  To T3 in standing, 55 in supine P/ROM   Elbow flexion        Elbow extension        Wrist flexion        Wrist extension        Wrist ulnar deviation        Wrist radial deviation        Wrist pronation        Wrist supination         (Blank rows = not tested) PROM Rt: flexion 100, ER 35, IR 25 with pain and hard end feel  UPPER EXTREMITY MMT:  MMT Right eval Left eval  Shoulder flexion 4-/5 5/5 throughout   Shoulder extension 4/5   Shoulder abduction 3+/5   Shoulder adduction    Shoulder extension    Shoulder internal rotation 4+/5   Shoulder external rotation 3+/5                                                (Blank rows = not tested)   TODAY'S TREATMENT:  Treatment on date: 10/31/21  Trigger Point Dry-Needling  Treatment instructions: Expect mild to moderate muscle soreness. S/S of pneumothorax if dry needled over a lung field, and to seek immediate medical attention should they occur. Patient verbalized understanding of these instructions and education.  Patient Consent Given: Yes Education handout provided: Yes Muscles treated: Rt lats at axilla, rhomboids and upper trap Treatment response/outcome: twitch response   Skilled palpation and monitoring by PT during dry needling  Manual: P/ROM and manual elongation to lats and pecs, and rhomboids  Treatment on date:  10/26/21  Trigger Point Dry-Needling  Treatment instructions: Expect mild to moderate muscle soreness. S/S of pneumothorax if dry needled over a lung field, and to seek immediate medical attention should they occur. Patient verbalized understanding of these instructions and education.  Patient Consent Given: Yes Education handout provided: Yes Muscles treated: Rt lats along lateral rib cage, lats posterior to axilla and pecs, rhomboids and upper trap Treatment response/outcome: twitch response   Skilled palpation and monitoring by PT during dry needling  Manual: P/ROM and manual elongation to lats and pecs, and rhomboids  Treatment on date: 10/18/21  Trigger Point Dry-Needling  Treatment instructions: Expect mild to moderate muscle soreness. S/S of pneumothorax if dry needled over a lung field, and to seek immediate medical attention should they occur. Patient verbalized understanding of these instructions and education.  Patient Consent Given: Yes Education handout provided: Yes Muscles treated: Rt lats along lateral rib cage, lats posterior to axilla and pecs, rhomboids and upper trap Treatment response/outcome: twitch response   Skilled palpation and monitoring by PT during dry needling  Manual: P/ROM and manual elongation to lats and pecs, and rhomboids    PATIENT EDUCATION:  Education details: Access Code: [email or web address] Person educated: Patient Education method: Explanation, Demonstration, and Handouts Education comprehension: verbalized understanding and returned demonstration   HOME EXERCISE PROGRAM: Access Code: [email or web address] URL: [email or web address] Date: 09/13/2021 Prepared by: Claiborne Billings  Exercises - Seated Shoulder Flexion AAROM with Pulley Behind  - 1 x daily - 7 x weekly - 3 sets - 10 reps - Seated Shoulder Scaption AAROM with Pulley at Side  - 1 x daily - 7 x weekly - 3 sets - 10 reps - Standing Shoulder Internal Rotation AAROM with Pulley  - 1 x daily - 7 x  weekly - 3 sets - 10 reps - Supine Shoulder Flexion AAROM with Hands Clasped  - 3 x daily - 7 x weekly - 1 sets - 10 reps - 10 hold - Seated Shoulder Flexion Towel Slide at Table Top  - 3 x daily - 7 x weekly - 1 sets - 10 reps - 10 hold  ASSESSMENT:  CLINICAL IMPRESSION: Pt is making steady progress regarding Rt shoulder ROM, functional use and reduced pain overall.   All ROM is improved.  Pt with continued yet improving trigger points in Rt scapula and lats.  Pt with firm to hard end feel with P/ROM at end range of each Rt shoulder ROM.   Pt reports 50% overall improvement in symptoms  Patient will benefit from skilled PT to address the below impairments and improve overall function.    OBJECTIVE IMPAIRMENTS decreased ROM, decreased strength, increased fascial restrictions, increased muscle spasms, impaired UE functional use, postural dysfunction, and pain.   ACTIVITY LIMITATIONS carrying, lifting, and transfers  PARTICIPATION LIMITATIONS: cleaning and laundry  PERSONAL FACTORS 1-2 comorbidities: Rt breast cancer with lumpectomy and node removal, Lt frozen shoulder   are also affecting patient's functional outcome.   REHAB POTENTIAL: Good  CLINICAL DECISION MAKING: Stable/uncomplicated  EVALUATION COMPLEXITY: Low   GOALS: Goals reviewed with patient? Yes  SHORT TERM GOALS: Target date: 10/10/21   Be independent in initial HEP Baseline:  Goal status: MET (09/27/21)  2.  Demonstrate Rt shoulder A/ROM flexion to > or = to 110 degrees to improve overhead reaching  Baseline: 120 (09/27/21) Goal status: MET  3.  Demonstrate Rt shoulder IR A/ROM to L3 to  improve dressing  Baseline: lateral gluteals  Goal status: INITIAL  4.  Report > or = to 25% reduction in Rt shoulder pain with dressing and reaching overhead  Baseline:  Goal status: MET (10/12/21)    LONG TERM GOALS: Target date: 12/29/21  Be independent in advanced HEP Baseline:  Goal status: In progress   2.  Improve  FOTO to > or = to 68 Baseline: 65 Goal status: In progress  3.  Demonstrate Rt shoulder A/ROM flexion to > or = to 135 degrees to improve overhead reaching  Baseline: P/ROM to 118 (10/31/21) Goal status: revised   4.  Demonstrate Rt shoulder A/ROM IR to T11 to improve dressing  Baseline: L2 (10/31/21) Goal status: revised  5.  Demonstrate Rt shoulder A/ROM ER to occiput to T5 to assist with self-care Baseline: T3 Goal status: revised   6.  Report > or = to 70% reduction in Rt shoulder pain with use  Baseline: 50%  Goal status: revised    PLAN: PT FREQUENCY: 1x/week  PT DURATION: 8 weeks  PLANNED INTERVENTIONS: Therapeutic exercises, Therapeutic activity, Neuromuscular re-education, Patient/Family education, Joint mobilization, Dry Needling, Cryotherapy, Moist heat, scar mobilization, Taping, and Manual therapy  PLAN FOR NEXT SESSION: Rt shoulder ROM, DN and tissue mobility.     Sigurd Sos, PT 10/31/21 1:24 PM  [facility] [address] [place], [place] [address] Phone # [phone] Fax [phone]

## 2021-11-12 ENCOUNTER — Other Ambulatory Visit: Payer: Self-pay | Admitting: Hematology and Oncology

## 2021-11-12 DIAGNOSIS — Z170 Estrogen receptor positive status [ER+]: Secondary | ICD-10-CM

## 2021-11-20 ENCOUNTER — Ambulatory Visit: Payer: BC Managed Care – PPO | Attending: Hematology and Oncology

## 2021-11-20 DIAGNOSIS — G8929 Other chronic pain: Secondary | ICD-10-CM | POA: Insufficient documentation

## 2021-11-20 DIAGNOSIS — M25511 Pain in right shoulder: Secondary | ICD-10-CM | POA: Diagnosis present

## 2021-11-20 DIAGNOSIS — Z483 Aftercare following surgery for neoplasm: Secondary | ICD-10-CM | POA: Insufficient documentation

## 2021-11-20 DIAGNOSIS — R252 Cramp and spasm: Secondary | ICD-10-CM | POA: Insufficient documentation

## 2021-11-20 DIAGNOSIS — M25611 Stiffness of right shoulder, not elsewhere classified: Secondary | ICD-10-CM | POA: Insufficient documentation

## 2021-11-20 NOTE — Therapy (Signed)
OUTPATIENT PHYSICAL THERAPY TREATMENT   Patient Name: Michelle Anthony MRN: [id number] DOB:1965-04-23, 56 y.o., female Today's Date: 11/20/2021   PT End of Session - 11/20/21 1311     Visit Number 8    Date for PT Re-Evaluation 12/29/21    Authorization Type BCBS    PT Start Time 1231    PT Stop Time 1311    PT Time Calculation (min) 40 min    Activity Tolerance Patient tolerated treatment well    Behavior During Therapy WFL for tasks assessed/performed                    Past Medical History:  Diagnosis Date   Erythema nodosum    Family history of breast cancer    Family history of melanoma    Frequent UTI    Frozen shoulder    History of radiation therapy 06/23/20-07/22/20   IMRT to Right Breast, Dr. Gery Pray    STD (sexually transmitted disease)    H/O HSV II   Past Surgical History:  Procedure Laterality Date   BOTOX INJECTION     BREAST LUMPECTOMY WITH RADIOACTIVE SEED AND SENTINEL LYMPH NODE BIOPSY Right 05/17/2020   Procedure: RIGHT BREAST LUMPECTOMY WITH RADIOACTIVE SEED x5 AND SENTINEL LYMPH NODE MAPPING;  Surgeon: Erroll Luna, MD;  Location: [facility];  Service: General;  Laterality: Right;   CLOSED MANIPULATION SHOULDER     PORTACATH PLACEMENT Right 01/05/2020   Procedure: INSERTION PORT-A-CATH WITH ULTRASOUND GUIDANCE;  Surgeon: Erroll Luna, MD;  Location: [facility];  Service: General;  Laterality: Right;   Patient Active Problem List   Diagnosis Date Noted   Family history of malignant neoplasm of gastrointestinal tract 08/30/2020   Colon cancer screening 08/30/2020   Port-A-Cath in place 01/13/2020   Genetic testing 01/01/2020   Family history of breast cancer    Family history of melanoma    Malignant neoplasm of upper-outer quadrant of right breast in female, estrogen receptor positive ([place] [facility]) 12/16/2019   Abnormal mammogram 12/16/2019   Frozen shoulder 12/16/2018   Arthritis of carpometacarpal ([place])  joint of thumb 11/01/2017   Fatigue 11/01/2017    PCP: Suzanna Obey, MD  REFERRING PROVIDER: Nicholas Lose, MD  REFERRING DIAG: Right shoulder pain, unspecified chronicity  - Primary  THERAPY DIAG:  Chronic right shoulder pain  Stiffness of right shoulder, not elsewhere classified  Cramp and spasm  Aftercare following surgery for neoplasm  Rationale for Evaluation and Treatment Rehabilitation  ONSET DATE: 1 year ago  SUBJECTIVE:  SUBJECTIVE STATEMENT: I had a set-back over my trip.  I wok up with 10/10 pain my Rt shoulder.  For 3 days, I couldn't move my shoulder much.  I am feeling better now.    PERTINENT HISTORY:  Rt breast cancer: radiation 06/23/20-07/22/20 Rt lumpectomy 05/2020 Lt frozen shoulder with manipulation  PAIN:  Are you having pain? Yes: NPRS scale: 3/10 Pain location: Rt shoulder and arm Pain description: shooting Aggravating factors: sleep at night, getting dressed, reaching overhead   Relieving factors: heat, rest, ice, massage   PRECAUTIONS: Other: Breast cancer and radiation to Rt breast  WEIGHT BEARING RESTRICTIONS No  FALLS:  Has patient fallen in last 6 months? No  LIVING ENVIRONMENT: Lives with: lives with their family  OCCUPATION: desk work  PLOF: Independent  PATIENT GOALS reduce Rt shoulder pain, improve Rt UE A/ROM  OBJECTIVE:  DIAGNOSTIC FINDINGS:  None   PATIENT SURVEYS:  FOTO 44 (goal is 68) 10/31/21: 65 (goal is 58)  COGNITION: Overall cognitive status: Within functional limits for tasks assessed   SENSATION: WFL  POSTURE: No Significant postural limitations  PALPATION: Palpable trigger points and tension over lateral rib cage, pecs and lats on the Rt.  Global tenderness over Rt glenohumeral joint.  Reduced Rt  glenohumeral joint mobility in all directions,    CERVICAL ROM:  WFLs without pain  UPPER EXTREMITY ROM:  Active ROM Right eval Right  09/27/21 Right 10/12/21 Right  10/18/21 Right  10/31/21 Left eval  Shoulder flexion 100 120 133 Seated 105,supine 133 118 active standing  130 P/ROM Full without pain  Shoulder extension        Shoulder abduction 65 83 85  85 A/ROM  90 P/ROM   Shoulder adduction        Shoulder extension        Shoulder internal rotation To Rt buttock   L4  L2   Shoulder external rotation To Rt upper trap 45 55  To T3 in standing, 55 in supine P/ROM   Elbow flexion        Elbow extension        Wrist flexion        Wrist extension        Wrist ulnar deviation        Wrist radial deviation        Wrist pronation        Wrist supination         (Blank rows = not tested) PROM Rt: flexion 100, ER 35, IR 25 with pain and hard end feel  UPPER EXTREMITY MMT:  MMT Right eval Left eval  Shoulder flexion 4-/5 5/5 throughout   Shoulder extension 4/5   Shoulder abduction 3+/5   Shoulder adduction    Shoulder extension    Shoulder internal rotation 4+/5   Shoulder external rotation 3+/5                                                (Blank rows = not tested)   TODAY'S TREATMENT:  Treatment on date: 11/20/21  Trigger Point Dry-Needling  Treatment instructions: Expect mild to moderate muscle soreness. S/S of pneumothorax if dry needled over a lung field, and to seek immediate medical attention should they occur. Patient verbalized understanding of these instructions and education.  Patient Consent Given: Yes Education handout provided: Yes Muscles treated: Rt  lats at axilla, rhomboids and upper trap Treatment response/outcome: twitch response   Skilled palpation and monitoring by PT during dry needling  Manual: P/ROM and manual elongation to lats and pecs, and rhomboids  Treatment on date: 10/31/21  Trigger Point Dry-Needling  Treatment  instructions: Expect mild to moderate muscle soreness. S/S of pneumothorax if dry needled over a lung field, and to seek immediate medical attention should they occur. Patient verbalized understanding of these instructions and education.  Patient Consent Given: Yes Education handout provided: Yes Muscles treated: Rt lats at axilla, rhomboids and upper trap Treatment response/outcome: twitch response   Skilled palpation and monitoring by PT during dry needling  Manual: P/ROM and manual elongation to lats and pecs, and rhomboids  Treatment on date: 10/26/21  Trigger Point Dry-Needling  Treatment instructions: Expect mild to moderate muscle soreness. S/S of pneumothorax if dry needled over a lung field, and to seek immediate medical attention should they occur. Patient verbalized understanding of these instructions and education.  Patient Consent Given: Yes Education handout provided: Yes Muscles treated: Rt lats along lateral rib cage, lats posterior to axilla and pecs, rhomboids and upper trap Treatment response/outcome: twitch response   Skilled palpation and monitoring by PT during dry needling  Manual: P/ROM and manual elongation to lats and pecs, and rhomboids  Treatment on date: 10/18/21  Trigger Point Dry-Needling  Treatment instructions: Expect mild to moderate muscle soreness. S/S of pneumothorax if dry needled over a lung field, and to seek immediate medical attention should they occur. Patient verbalized understanding of these instructions and education.  Patient Consent Given: Yes Education handout provided: Yes Muscles treated: Rt lats along lateral rib cage, lats posterior to axilla and pecs, rhomboids and upper trap Treatment response/outcome: twitch response   Skilled palpation and monitoring by PT during dry needling  Manual: P/ROM and manual elongation to lats and pecs, and rhomboids    PATIENT EDUCATION:  Education details: Access Code: [email or web address] Person educated:  Patient Education method: Explanation, Demonstration, and Handouts Education comprehension: verbalized understanding and returned demonstration   HOME EXERCISE PROGRAM: Access Code: [email or web address] URL: [email or web address] Date: 09/13/2021 Prepared by: Claiborne Billings  Exercises - Seated Shoulder Flexion AAROM with Pulley Behind  - 1 x daily - 7 x weekly - 3 sets - 10 reps - Seated Shoulder Scaption AAROM with Pulley at Side  - 1 x daily - 7 x weekly - 3 sets - 10 reps - Standing Shoulder Internal Rotation AAROM with Pulley  - 1 x daily - 7 x weekly - 3 sets - 10 reps - Supine Shoulder Flexion AAROM with Hands Clasped  - 3 x daily - 7 x weekly - 1 sets - 10 reps - 10 hold - Seated Shoulder Flexion Towel Slide at Table Top  - 3 x daily - 7 x weekly - 1 sets - 10 reps - 10 hold  ASSESSMENT:  CLINICAL IMPRESSION: Pt reports a set-back over her lapse in treatment due to travel.  Pt reports that she lifted something heavy overhead and woke up with 10/10 pain and had 3 days where she couldn't use her shoulder. Pt has slowly progressed to improved motion and use since this time and pain is 3/10 today. Pt with Rt IR to L4 today, less than last tested due to set-back.  Pt with trigger points and tension in Rt scapular musculature and had good twitch response and improved tissue mobility after manual therapy and needling. Patient will benefit from skilled PT to address the below  impairments and improve overall function.    OBJECTIVE IMPAIRMENTS decreased ROM, decreased strength, increased fascial restrictions, increased muscle spasms, impaired UE functional use, postural dysfunction, and pain.   ACTIVITY LIMITATIONS carrying, lifting, and transfers  PARTICIPATION LIMITATIONS: cleaning and laundry  PERSONAL FACTORS 1-2 comorbidities: Rt breast cancer with lumpectomy and node removal, Lt frozen shoulder   are also affecting patient's functional outcome.   REHAB POTENTIAL: Good  CLINICAL DECISION  MAKING: Stable/uncomplicated  EVALUATION COMPLEXITY: Low   GOALS: Goals reviewed with patient? Yes  SHORT TERM GOALS: Target date: 10/10/21   Be independent in initial HEP Baseline:  Goal status: MET (09/27/21)  2.  Demonstrate Rt shoulder A/ROM flexion to > or = to 110 degrees to improve overhead reaching  Baseline: 120 (09/27/21) Goal status: MET  3.  Demonstrate Rt shoulder IR A/ROM to L3 to improve dressing  Baseline: L4 (11/20/21) Goal status: In progress   4.  Report > or = to 25% reduction in Rt shoulder pain with dressing and reaching overhead  Baseline:  Goal status: MET (10/12/21)    LONG TERM GOALS: Target date: 12/29/21  Be independent in advanced HEP Baseline:  Goal status: In progress   2.  Improve FOTO to > or = to 68 Baseline: 65 Goal status: In progress  3.  Demonstrate Rt shoulder A/ROM flexion to > or = to 135 degrees to improve overhead reaching  Baseline: P/ROM to 118 (10/31/21) Goal status: revised   4.  Demonstrate Rt shoulder A/ROM IR to T11 to improve dressing  Baseline: L2 (10/31/21) Goal status: revised  5.  Demonstrate Rt shoulder A/ROM ER to occiput to T5 to assist with self-care Baseline: T3 Goal status: revised   6.  Report > or = to 70% reduction in Rt shoulder pain with use  Baseline: 50%  Goal status: revised    PLAN: PT FREQUENCY: 1x/week  PT DURATION: 8 weeks  PLANNED INTERVENTIONS: Therapeutic exercises, Therapeutic activity, Neuromuscular re-education, Patient/Family education, Joint mobilization, Dry Needling, Cryotherapy, Moist heat, scar mobilization, Taping, and Manual therapy  PLAN FOR NEXT SESSION: Rt shoulder ROM, DN and tissue mobility.     Sigurd Sos, PT 11/20/21 1:15 PM  [facility] [address], [place], [place] [address] Phone # [phone] Fax [phone]

## 2021-11-21 ENCOUNTER — Ambulatory Visit: Payer: BC Managed Care – PPO | Admitting: Pharmacist

## 2021-11-21 ENCOUNTER — Other Ambulatory Visit: Payer: BC Managed Care – PPO

## 2021-11-23 ENCOUNTER — Other Ambulatory Visit: Payer: BC Managed Care – PPO

## 2021-11-23 ENCOUNTER — Ambulatory Visit: Payer: BC Managed Care – PPO | Admitting: Pharmacist

## 2021-11-27 ENCOUNTER — Ambulatory Visit: Payer: BC Managed Care – PPO

## 2021-11-27 DIAGNOSIS — M25611 Stiffness of right shoulder, not elsewhere classified: Secondary | ICD-10-CM

## 2021-11-27 DIAGNOSIS — R252 Cramp and spasm: Secondary | ICD-10-CM

## 2021-11-27 DIAGNOSIS — G8929 Other chronic pain: Secondary | ICD-10-CM

## 2021-11-27 DIAGNOSIS — M25511 Pain in right shoulder: Secondary | ICD-10-CM | POA: Diagnosis not present

## 2021-11-27 NOTE — Therapy (Signed)
OUTPATIENT PHYSICAL THERAPY TREATMENT   Patient Name: Michelle Anthony MRN: [id number] DOB:[birth year], 56 y.o., female Today's Date: 11/27/2021   PT End of Session - 11/27/21 1317     Visit Number 9    Date for PT Re-Evaluation 12/29/21    Authorization Type BCBS    PT Start Time 1236    PT Stop Time 7628    PT Time Calculation (min) 41 min    Activity Tolerance Patient tolerated treatment well    Behavior During Therapy WFL for tasks assessed/performed                     Past Medical History:  Diagnosis Date   Erythema nodosum    Family history of breast cancer    Family history of melanoma    Frequent UTI    Frozen shoulder    History of radiation therapy 06/23/20-07/22/20   IMRT to Right Breast, Dr. Gery Pray    STD (sexually transmitted disease)    H/O HSV II   Past Surgical History:  Procedure Laterality Date   BOTOX INJECTION     BREAST LUMPECTOMY WITH RADIOACTIVE SEED AND SENTINEL LYMPH NODE BIOPSY Right 05/17/2020   Procedure: RIGHT BREAST LUMPECTOMY WITH RADIOACTIVE SEED x5 AND SENTINEL LYMPH NODE MAPPING;  Surgeon: Erroll Luna, MD;  Location: [facility];  Service: General;  Laterality: Right;   CLOSED MANIPULATION SHOULDER     PORTACATH PLACEMENT Right 01/05/2020   Procedure: INSERTION PORT-A-CATH WITH ULTRASOUND GUIDANCE;  Surgeon: Erroll Luna, MD;  Location: [facility];  Service: General;  Laterality: Right;   Patient Active Problem List   Diagnosis Date Noted   Family history of malignant neoplasm of gastrointestinal tract 08/30/2020   Colon cancer screening 08/30/2020   Port-A-Cath in place 01/13/2020   Genetic testing 01/01/2020   Family history of breast cancer    Family history of melanoma    Malignant neoplasm of upper-outer quadrant of right breast in female, estrogen receptor positive ([place]) 12/16/2019   Abnormal mammogram 12/16/2019   Frozen shoulder 12/16/2018   Arthritis of carpometacarpal  ([place]) joint of thumb 11/01/2017   Fatigue 11/01/2017    PCP: Suzanna Obey, MD  REFERRING PROVIDER: Nicholas Lose, MD  REFERRING DIAG: Right shoulder pain, unspecified chronicity  - Primary  THERAPY DIAG:  Chronic right shoulder pain  Stiffness of right shoulder, not elsewhere classified  Cramp and spasm  Rationale for Evaluation and Treatment Rehabilitation  ONSET DATE: 1 year ago  SUBJECTIVE:  SUBJECTIVE STATEMENT: I saw Dr Noemi Chapel and he said I have 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  I got a cortisone injection. It helped me a lot.   PERTINENT HISTORY:  Rt breast cancer: radiation 06/23/20-07/22/20 Rt lumpectomy 05/2020 Lt frozen shoulder with manipulation  PAIN:  Are you having pain? Yes: NPRS scale: 0/10 Pain location: Rt shoulder and arm Pain description: shooting Aggravating factors: sleep at night, getting dressed, reaching overhead   Relieving factors: heat, rest, ice, massage   PRECAUTIONS: Other: Breast cancer and radiation to Rt breast  WEIGHT BEARING RESTRICTIONS No  FALLS:  Has patient fallen in last 6 months? No  LIVING ENVIRONMENT: Lives with: lives with their family  OCCUPATION: desk work  PLOF: Independent  PATIENT GOALS reduce Rt shoulder pain, improve Rt UE A/ROM  OBJECTIVE:  DIAGNOSTIC FINDINGS:  None   PATIENT SURVEYS:  FOTO 44 (goal is 72) 10/31/21: 65 (goal is 42)  COGNITION: Overall cognitive status: Within functional limits for tasks assessed   SENSATION: WFL  POSTURE: No Significant postural limitations  PALPATION: Palpable trigger points and tension over lateral rib cage, pecs and lats on the Rt.  Global tenderness over Rt glenohumeral joint.  Reduced Rt glenohumeral joint mobility in all directions,     CERVICAL ROM:  WFLs without pain  UPPER EXTREMITY ROM:  Active ROM Right eval Right  09/27/21 Right 10/12/21 Right  10/18/21 Right  10/31/21 Right 11/27/21 Left eval  Shoulder flexion 100 120 133 Seated 105,supine 133 118 active standing  130 P/ROM  Full without pain  Shoulder extension         Shoulder abduction 65 83 85  85 A/ROM  90 P/ROM    Shoulder adduction         Shoulder extension         Shoulder internal rotation To Rt buttock   L4  L2 T12   Shoulder external rotation To Rt upper trap 45 55  To T3 in standing, 55 in supine P/ROM    Elbow flexion         Elbow extension         Wrist flexion         Wrist extension         Wrist ulnar deviation         Wrist radial deviation         Wrist pronation         Wrist supination          (Blank rows = not tested) PROM Rt: flexion 100, ER 35, IR 25 with pain and hard end feel  UPPER EXTREMITY MMT:  MMT Right eval Left eval  Shoulder flexion 4-/5 5/5 throughout   Shoulder extension 4/5   Shoulder abduction 3+/5   Shoulder adduction    Shoulder extension    Shoulder internal rotation 4+/5   Shoulder external rotation 3+/5                                                (Blank rows = not tested)  SOZO screen was completed today. She measured -4.2 which is an insignificant change from baseline, indicating no subclinical lymphedema. We will continue to monitor her for subclinical lymphedema using the SOZO machine every 3 months. [name], PT 11/27/2021  12:47   TODAY'S TREATMENT:  Treatment on date:  11/27/21  Trigger Point Dry-Needling  Treatment instructions: Expect mild to moderate muscle soreness. S/S of pneumothorax if dry needled over a lung field, and to seek immediate medical attention should they occur. Patient verbalized understanding of these instructions and education.  Patient Consent Given: Yes Education handout provided: Yes Muscles treated: Rt lats at axilla, rhomboids and upper  trap Treatment response/outcome: twitch response   Skilled palpation and monitoring by PT during dry needling  Manual: P/ROM and manual elongation to lats and pecs, and rhomboids   Treatment on date: 11/20/21  Trigger Point Dry-Needling  Treatment instructions: Expect mild to moderate muscle soreness. S/S of pneumothorax if dry needled over a lung field, and to seek immediate medical attention should they occur. Patient verbalized understanding of these instructions and education.  Patient Consent Given: Yes Education handout provided: Yes Muscles treated: Rt lats at axilla, rhomboids and upper trap Treatment response/outcome: twitch response   Skilled palpation and monitoring by PT during dry needling  Manual: P/ROM and manual elongation to lats and pecs, and rhomboids  Treatment on date: 10/31/21  Trigger Point Dry-Needling  Treatment instructions: Expect mild to moderate muscle soreness. S/S of pneumothorax if dry needled over a lung field, and to seek immediate medical attention should they occur. Patient verbalized understanding of these instructions and education.  Patient Consent Given: Yes Education handout provided: Yes Muscles treated: Rt lats at axilla, rhomboids and upper trap Treatment response/outcome: twitch response   Skilled palpation and monitoring by PT during dry needling  Manual: P/ROM and manual elongation to lats and pecs, and rhomboids  Treatment on date: 10/26/21  Trigger Point Dry-Needling  Treatment instructions: Expect mild to moderate muscle soreness. S/S of pneumothorax if dry needled over a lung field, and to seek immediate medical attention should they occur. Patient verbalized understanding of these instructions and education.  Patient Consent Given: Yes Education handout provided: Yes Muscles treated: Rt lats along lateral rib cage, lats posterior to axilla and pecs, rhomboids and upper trap Treatment response/outcome: twitch response   Skilled  palpation and monitoring by PT during dry needling  Manual: P/ROM and manual elongation to lats and pecs, and rhomboids    PATIENT EDUCATION:  Education details: Access Code: [email or web address] Person educated: Patient Education method: Explanation, Demonstration, and Handouts Education comprehension: verbalized understanding and returned demonstration   HOME EXERCISE PROGRAM: Access Code: [email or web address] URL: [email or web address] Date: 09/13/2021 Prepared by: Claiborne Billings  Exercises - Seated Shoulder Flexion AAROM with Pulley Behind  - 1 x daily - 7 x weekly - 3 sets - 10 reps - Seated Shoulder Scaption AAROM with Pulley at Side  - 1 x daily - 7 x weekly - 3 sets - 10 reps - Standing Shoulder Internal Rotation AAROM with Pulley  - 1 x daily - 7 x weekly - 3 sets - 10 reps - Supine Shoulder Flexion AAROM with Hands Clasped  - 3 x daily - 7 x weekly - 1 sets - 10 reps - 10 hold - Seated Shoulder Flexion Towel Slide at Table Top  - 3 x daily - 7 x weekly - 1 sets - 10 reps - 10 hold  ASSESSMENT:  CLINICAL IMPRESSION: Pt got a cortisone injection in the Rt shoulder and is feeling much better regarding pain. Rt shoulder IR is to T12 today and significant pain reduction in Rt UE with P/ROM.  Pt with trigger points and tension in Rt scapular musculature and had good twitch response and improved tissue mobility after manual therapy and needling.  PT will begin transition to more functional activity with the Rt UE in the next weeks if pain remains low. Patient will benefit from skilled PT to address the below impairments and improve overall function.    OBJECTIVE IMPAIRMENTS decreased ROM, decreased strength, increased fascial restrictions, increased muscle spasms, impaired UE functional use, postural dysfunction, and pain.   ACTIVITY LIMITATIONS carrying, lifting, and transfers  PARTICIPATION LIMITATIONS: cleaning and laundry  PERSONAL FACTORS 1-2 comorbidities: Rt breast cancer with lumpectomy  and node removal, Lt frozen shoulder   are also affecting patient's functional outcome.   REHAB POTENTIAL: Good  CLINICAL DECISION MAKING: Stable/uncomplicated  EVALUATION COMPLEXITY: Low   GOALS: Goals reviewed with patient? Yes  SHORT TERM GOALS: Target date: 10/10/21   Be independent in initial HEP Baseline:  Goal status: MET (09/27/21)  2.  Demonstrate Rt shoulder A/ROM flexion to > or = to 110 degrees to improve overhead reaching  Baseline: 120 (09/27/21) Goal status: MET  3.  Demonstrate Rt shoulder IR A/ROM to L3 to improve dressing  Baseline: L4 (11/20/21) Goal status: In progress   4.  Report > or = to 25% reduction in Rt shoulder pain with dressing and reaching overhead  Baseline:  Goal status: MET (10/12/21)    LONG TERM GOALS: Target date: 12/29/21  Be independent in advanced HEP Baseline:  Goal status: In progress   2.  Improve FOTO to > or = to 68 Baseline: 65 Goal status: In progress  3.  Demonstrate Rt shoulder A/ROM flexion to > or = to 135 degrees to improve overhead reaching  Baseline: P/ROM to 118 (10/31/21) Goal status: revised   4.  Demonstrate Rt shoulder A/ROM IR to T11 to improve dressing  Baseline: L2 (10/31/21) Goal status: revised  5.  Demonstrate Rt shoulder A/ROM ER to occiput to T5 to assist with self-care Baseline: T3 Goal status: revised   6.  Report > or = to 70% reduction in Rt shoulder pain with use  Baseline: 50%  Goal status: revised    PLAN: PT FREQUENCY: 1x/week  PT DURATION: 8 weeks  PLANNED INTERVENTIONS: Therapeutic exercises, Therapeutic activity, Neuromuscular re-education, Patient/Family education, Joint mobilization, Dry Needling, Cryotherapy, Moist heat, scar mobilization, Taping, and Manual therapy  PLAN FOR NEXT SESSION: Rt shoulder ROM, DN and tissue mobility. Begin strength progression.      Sigurd Sos, PT 11/27/21 1:23 PM  [facility] [address] [place]  [address], [place] [address] Phone # [phone] Fax [phone]

## 2021-12-04 ENCOUNTER — Ambulatory Visit: Payer: Self-pay

## 2021-12-06 ENCOUNTER — Ambulatory Visit: Payer: BC Managed Care – PPO

## 2021-12-06 DIAGNOSIS — R252 Cramp and spasm: Secondary | ICD-10-CM

## 2021-12-06 DIAGNOSIS — G8929 Other chronic pain: Secondary | ICD-10-CM

## 2021-12-06 DIAGNOSIS — M25611 Stiffness of right shoulder, not elsewhere classified: Secondary | ICD-10-CM

## 2021-12-06 DIAGNOSIS — M25511 Pain in right shoulder: Secondary | ICD-10-CM | POA: Diagnosis not present

## 2021-12-06 NOTE — Therapy (Signed)
OUTPATIENT PHYSICAL THERAPY TREATMENT   Patient Name: Michelle Anthony MRN: [id number] DOB:March 13, 1966, 56 y.o., female Today's Date: 12/06/2021   PT End of Session - 12/06/21 1316     Visit Number 10    Date for PT Re-Evaluation 12/29/21    Authorization Type BCBS    PT Start Time 1232    PT Stop Time 1315    PT Time Calculation (min) 43 min    Activity Tolerance Patient tolerated treatment well    Behavior During Therapy Michelle Anthony for tasks assessed/performed                      Past Medical History:  Diagnosis Date   Erythema nodosum    Family history of breast cancer    Family history of melanoma    Frequent UTI    Frozen shoulder    History of radiation therapy 06/23/20-07/22/20   IMRT to Right Breast, Dr. Gery Pray    STD (sexually transmitted disease)    H/O HSV II   Past Surgical History:  Procedure Laterality Date   BOTOX INJECTION     BREAST LUMPECTOMY WITH RADIOACTIVE SEED AND SENTINEL LYMPH NODE BIOPSY Right 05/17/2020   Procedure: RIGHT BREAST LUMPECTOMY WITH RADIOACTIVE SEED x5 AND SENTINEL LYMPH NODE MAPPING;  Surgeon: Erroll Luna, MD;  Location: [facility];  Service: General;  Laterality: Right;   CLOSED MANIPULATION SHOULDER     PORTACATH PLACEMENT Right 01/05/2020   Procedure: INSERTION PORT-A-CATH WITH ULTRASOUND GUIDANCE;  Surgeon: Erroll Luna, MD;  Location: [facility];  Service: General;  Laterality: Right;   Patient Active Problem List   Diagnosis Date Noted   Family history of malignant neoplasm of gastrointestinal tract 08/30/2020   Colon cancer screening 08/30/2020   Port-A-Cath in place 01/13/2020   Genetic testing 01/01/2020   Family history of breast cancer    Family history of melanoma    Malignant neoplasm of upper-outer quadrant of right breast in female, estrogen receptor positive ([place]) 12/16/2019   Abnormal mammogram 12/16/2019   Frozen shoulder 12/16/2018   Arthritis of carpometacarpal  ([place]) joint of thumb 11/01/2017   Fatigue 11/01/2017    PCP: Suzanna Obey, MD  REFERRING PROVIDER: Nicholas Lose, MD  REFERRING DIAG: Right shoulder pain, unspecified chronicity  - Primary  THERAPY DIAG:  Chronic right shoulder pain  Stiffness of right shoulder, not elsewhere classified  Cramp and spasm  Rationale for Evaluation and Treatment Rehabilitation  ONSET DATE: 1 year ago  SUBJECTIVE:  SUBJECTIVE STATEMENT: I saw Dr Noemi Chapel and he said I have 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  I got a cortisone injection. It helped me a lot.   PERTINENT HISTORY:  Rt breast cancer: radiation 06/23/20-07/22/20 Rt lumpectomy 05/2020 Lt frozen shoulder with manipulation  PAIN:  Are you having pain? Yes: NPRS scale: 0/10 Pain location: Rt shoulder and arm Pain description: shooting Aggravating factors: sleep at night, getting dressed, reaching overhead   Relieving factors: heat, rest, ice, massage   PRECAUTIONS: Other: Breast cancer and radiation to Rt breast  WEIGHT BEARING RESTRICTIONS No  FALLS:  Has patient fallen in last 6 months? No  LIVING ENVIRONMENT: Lives with: lives with their family  OCCUPATION: desk work  PLOF: Independent  PATIENT GOALS reduce Rt shoulder pain, improve Rt UE A/ROM  OBJECTIVE:  DIAGNOSTIC FINDINGS:  None   PATIENT SURVEYS:  FOTO 44 (goal is 72) 10/31/21: 65 (goal is 42)  COGNITION: Overall cognitive status: Within functional limits for tasks assessed   SENSATION: WFL  POSTURE: No Significant postural limitations  PALPATION: Palpable trigger points and tension over lateral rib cage, pecs and lats on the Rt.  Global tenderness over Rt glenohumeral joint.  Reduced Rt glenohumeral joint mobility in all directions,     CERVICAL ROM:  WFLs without pain  UPPER EXTREMITY ROM:  Active ROM Right eval Right  09/27/21 Right 10/12/21 Right  10/18/21 Right  10/31/21 Right 11/27/21 Left eval  Shoulder flexion 100 120 133 Seated 105,supine 133 118 active standing  130 P/ROM  Full without pain  Shoulder extension         Shoulder abduction 65 83 85  85 A/ROM  90 P/ROM    Shoulder adduction         Shoulder extension         Shoulder internal rotation To Rt buttock   L4  L2 T12   Shoulder external rotation To Rt upper trap 45 55  To T3 in standing, 55 in supine P/ROM    Elbow flexion         Elbow extension         Wrist flexion         Wrist extension         Wrist ulnar deviation         Wrist radial deviation         Wrist pronation         Wrist supination          (Blank rows = not tested) PROM Rt: flexion 100, ER 35, IR 25 with pain and hard end feel  UPPER EXTREMITY MMT:  MMT Right eval Left eval  Shoulder flexion 4-/5 5/5 throughout   Shoulder extension 4/5   Shoulder abduction 3+/5   Shoulder adduction    Shoulder extension    Shoulder internal rotation 4+/5   Shoulder external rotation 3+/5                                                (Blank rows = not tested)  SOZO screen was completed today. She measured -4.2 which is an insignificant change from baseline, indicating no subclinical lymphedema. We will continue to monitor her for subclinical lymphedema using the SOZO machine every 3 months. [name], PT 11/27/2021  12:47   TODAY'S TREATMENT:  Treatment on date:  12/06/21 Supine on foam roll: arm out to stretch pecs Supine yellow theraband: horizontal abduction, ER with band- added to HEP  Trigger Point Dry-Needling  Treatment instructions: Expect mild to moderate muscle soreness. S/S of pneumothorax if dry needled over a lung field, and to seek immediate medical attention should they occur. Patient verbalized understanding of these instructions and  education.  Patient Consent Given: Yes Education handout provided: Yes Muscles treated: Rt lats at axilla, rhomboids and upper trap Treatment response/outcome: twitch response   Skilled palpation and monitoring by PT during dry needling  Manual: P/ROM and manual elongation to lats and pecs, and rhomboids  Treatment on date: 11/27/21  Trigger Point Dry-Needling  Treatment instructions: Expect mild to moderate muscle soreness. S/S of pneumothorax if dry needled over a lung field, and to seek immediate medical attention should they occur. Patient verbalized understanding of these instructions and education.  Patient Consent Given: Yes Education handout provided: Yes Muscles treated: Rt lats at axilla, rhomboids and upper trap Treatment response/outcome: twitch response   Skilled palpation and monitoring by PT during dry needling  Manual: P/ROM and manual elongation to lats and pecs, and rhomboids   Treatment on date: 11/20/21  Trigger Point Dry-Needling  Treatment instructions: Expect mild to moderate muscle soreness. S/S of pneumothorax if dry needled over a lung field, and to seek immediate medical attention should they occur. Patient verbalized understanding of these instructions and education.  Patient Consent Given: Yes Education handout provided: Yes Muscles treated: Rt lats at axilla, rhomboids and upper trap Treatment response/outcome: twitch response   Skilled palpation and monitoring by PT during dry needling  Manual: P/ROM and manual elongation to lats and pecs, and rhomboids  Treatment on date: 10/31/21  Trigger Point Dry-Needling  Treatment instructions: Expect mild to moderate muscle soreness. S/S of pneumothorax if dry needled over a lung field, and to seek immediate medical attention should they occur. Patient verbalized understanding of these instructions and education.  Patient Consent Given: Yes Education handout provided: Yes Muscles treated: Rt lats at axilla,  rhomboids and upper trap Treatment response/outcome: twitch response   Skilled palpation and monitoring by PT during dry needling  Manual: P/ROM and manual elongation to lats and pecs, and rhomboids  Treatment on date: 10/26/21  Trigger Point Dry-Needling  Treatment instructions: Expect mild to moderate muscle soreness. S/S of pneumothorax if dry needled over a lung field, and to seek immediate medical attention should they occur. Patient verbalized understanding of these instructions and education.  Patient Consent Given: Yes Education handout provided: Yes Muscles treated: Rt lats along lateral rib cage, lats posterior to axilla and pecs, rhomboids and upper trap Treatment response/outcome: twitch response   Skilled palpation and monitoring by PT during dry needling  Manual: P/ROM and manual elongation to lats and pecs, and rhomboids    PATIENT EDUCATION:  Education details: Access Code: [email or web address] Person educated: Patient Education method: Explanation, Demonstration, and Handouts Education comprehension: verbalized understanding and returned demonstration   HOME EXERCISE PROGRAM: Access Code: [email or web address] URL: [email or web address] Date: 12/06/2021 Prepared by: Claiborne Billings  Exercises - Seated Shoulder Flexion AAROM with Pulley Behind  - 1 x daily - 7 x weekly - 3 sets - 10 reps - Seated Shoulder Scaption AAROM with Pulley at Side  - 1 x daily - 7 x weekly - 3 sets - 10 reps - Standing Shoulder Internal Rotation AAROM with Pulley  - 1 x daily - 7 x weekly - 3 sets - 10 reps - Supine Shoulder Flexion  AAROM with Hands Clasped  - 3 x daily - 7 x weekly - 1 sets - 10 reps - 10 hold - Seated Shoulder Flexion Towel Slide at Table Top  - 3 x daily - 7 x weekly - 1 sets - 10 reps - 10 hold - Supine Shoulder External Rotation with Dowel  - 2 x daily - 7 x weekly - 1 sets - 10 reps - 10 hold - Supine Shoulder Horizontal Abduction with Resistance  - 2 x daily - 7 x weekly - 2 sets - 10  reps - Supine Bilateral Shoulder External Rotation with Resistance  - 2 x daily - 7 x weekly - 2 sets - 10 reps ASSESSMENT:  CLINICAL IMPRESSION: Pt continues to report reduced Rt UE pain after cortisone injection.  PT initiated exercise for postural strength.  Pt required frequent tactile cues for scapular depression.  Pt with tension and trigger point in Rt scapula and posterior shoulder and had good response to DN with twitch response and improved tissue mobility after manual therapy.  Patient will benefit from skilled PT to address the below impairments and improve overall function.    OBJECTIVE IMPAIRMENTS decreased ROM, decreased strength, increased fascial restrictions, increased muscle spasms, impaired UE functional use, postural dysfunction, and pain.   ACTIVITY LIMITATIONS carrying, lifting, and transfers  PARTICIPATION LIMITATIONS: cleaning and laundry  PERSONAL FACTORS 1-2 comorbidities: Rt breast cancer with lumpectomy and node removal, Lt frozen shoulder   are also affecting patient's functional outcome.   REHAB POTENTIAL: Good  CLINICAL DECISION MAKING: Stable/uncomplicated  EVALUATION COMPLEXITY: Low   GOALS: Goals reviewed with patient? Yes  SHORT TERM GOALS: Target date: 10/10/21   Be independent in initial HEP Baseline:  Goal status: MET (09/27/21)  2.  Demonstrate Rt shoulder A/ROM flexion to > or = to 110 degrees to improve overhead reaching  Baseline: 120 (09/27/21) Goal status: MET  3.  Demonstrate Rt shoulder IR A/ROM to L3 to improve dressing  Baseline: L4 (11/20/21) Goal status: In progress   4.  Report > or = to 25% reduction in Rt shoulder pain with dressing and reaching overhead  Baseline:  Goal status: MET (10/12/21)    LONG TERM GOALS: Target date: 12/29/21  Be independent in advanced HEP Baseline:  Goal status: In progress   2.  Improve FOTO to > or = to 68 Baseline: 65 Goal status: In progress  3.  Demonstrate Rt shoulder A/ROM flexion  to > or = to 135 degrees to improve overhead reaching  Baseline: P/ROM to 118 (10/31/21) Goal status: revised   4.  Demonstrate Rt shoulder A/ROM IR to T11 to improve dressing  Baseline: L2 (10/31/21) Goal status: revised  5.  Demonstrate Rt shoulder A/ROM ER to occiput to T5 to assist with self-care Baseline: T3 Goal status: revised   6.  Report > or = to 70% reduction in Rt shoulder pain with use  Baseline: 50%  Goal status: revised    PLAN: PT FREQUENCY: 1x/week  PT DURATION: 8 weeks  PLANNED INTERVENTIONS: Therapeutic exercises, Therapeutic activity, Neuromuscular re-education, Patient/Family education, Joint mobilization, Dry Needling, Cryotherapy, Moist heat, scar mobilization, Taping, and Manual therapy  PLAN FOR NEXT SESSION: Rt shoulder ROM, DN and tissue mobility. Review HEP issued last session      Sigurd Sos, PT 12/06/21 1:16 PM  [facility] [address] [place], [place] [address] Phone # [phone] Fax [phone]

## 2021-12-13 ENCOUNTER — Encounter: Payer: Self-pay | Admitting: Hematology and Oncology

## 2021-12-13 ENCOUNTER — Ambulatory Visit: Payer: BC Managed Care – PPO

## 2021-12-18 ENCOUNTER — Other Ambulatory Visit: Payer: Self-pay | Admitting: Hematology and Oncology

## 2021-12-18 DIAGNOSIS — Z170 Estrogen receptor positive status [ER+]: Secondary | ICD-10-CM

## 2021-12-19 ENCOUNTER — Encounter: Payer: Self-pay | Admitting: Hematology and Oncology

## 2021-12-19 NOTE — Telephone Encounter (Signed)
Michelle Anthony, wanted to forward this over to you since you see [name] this week. Thanks!

## 2021-12-20 ENCOUNTER — Ambulatory Visit: Payer: BC Managed Care – PPO

## 2021-12-20 ENCOUNTER — Other Ambulatory Visit: Payer: Self-pay | Admitting: *Deleted

## 2021-12-20 ENCOUNTER — Encounter: Payer: Self-pay | Admitting: Hematology and Oncology

## 2021-12-20 ENCOUNTER — Other Ambulatory Visit: Payer: Self-pay | Admitting: Radiology

## 2021-12-20 DIAGNOSIS — C50411 Malignant neoplasm of upper-outer quadrant of right female breast: Secondary | ICD-10-CM

## 2021-12-21 ENCOUNTER — Other Ambulatory Visit: Payer: Self-pay | Admitting: Pharmacist

## 2021-12-21 ENCOUNTER — Inpatient Hospital Stay: Payer: BC Managed Care – PPO | Admitting: Pharmacist

## 2021-12-21 ENCOUNTER — Inpatient Hospital Stay: Payer: BC Managed Care – PPO | Attending: Hematology and Oncology

## 2021-12-21 ENCOUNTER — Other Ambulatory Visit: Payer: Self-pay

## 2021-12-21 VITALS — BP 112/81 | HR 72 | Temp 97.8°F | Resp 18 | Ht 65.0 in | Wt 150.1 lb

## 2021-12-21 DIAGNOSIS — C50411 Malignant neoplasm of upper-outer quadrant of right female breast: Secondary | ICD-10-CM | POA: Diagnosis present

## 2021-12-21 DIAGNOSIS — Z170 Estrogen receptor positive status [ER+]: Secondary | ICD-10-CM

## 2021-12-21 LAB — CMP (CANCER CENTER ONLY)
ALT: 23 U/L (ref 0–44)
AST: 21 U/L (ref 15–41)
Albumin: 4.5 g/dL (ref 3.5–5.0)
Alkaline Phosphatase: 66 U/L (ref 38–126)
Anion gap: 6 (ref 5–15)
BUN: 21 mg/dL — ABNORMAL HIGH (ref 6–20)
CO2: 30 mmol/L (ref 22–32)
Calcium: 10 mg/dL (ref 8.9–10.3)
Chloride: 106 mmol/L (ref 98–111)
Creatinine: 0.82 mg/dL (ref 0.44–1.00)
GFR, Estimated: >60 mL/min (ref >60)
Glucose, Bld: 91 mg/dL (ref 70–99)
Potassium: 4 mmol/L (ref 3.5–5.1)
Sodium: 142 mmol/L (ref 135–145)
Total Bilirubin: 0.6 mg/dL (ref 0.3–1.2)
Total Protein: 7.3 g/dL (ref 6.5–8.1)

## 2021-12-21 LAB — CBC WITH DIFFERENTIAL (CANCER CENTER ONLY)
Abs Immature Granulocytes: 0.01 10*3/uL (ref 0.00–0.07)
Basophils Absolute: 0 10*3/uL (ref 0.0–0.1)
Basophils Relative: 1 %
Eosinophils Absolute: 0.1 10*3/uL (ref 0.0–0.5)
Eosinophils Relative: 1 %
HCT: 38.6 % (ref 36.0–46.0)
Hemoglobin: 13.1 g/dL (ref 12.0–15.0)
Immature Granulocytes: 0 %
Lymphocytes Relative: 19 %
Lymphs Abs: 1.4 10*3/uL (ref 0.7–4.0)
MCH: 31.6 pg (ref 26.0–34.0)
MCHC: 33.9 g/dL (ref 30.0–36.0)
MCV: 93.2 fL (ref 80.0–100.0)
Monocytes Absolute: 0.5 10*3/uL (ref 0.1–1.0)
Monocytes Relative: 7 %
Neutro Abs: 5.3 10*3/uL (ref 1.7–7.7)
Neutrophils Relative %: 72 %
Platelet Count: 249 10*3/uL (ref 150–400)
RBC: 4.14 MIL/uL (ref 3.87–5.11)
RDW: 13.5 % (ref 11.5–15.5)
WBC Count: 7.4 10*3/uL (ref 4.0–10.5)
nRBC: 0 % (ref 0.0–0.2)

## 2021-12-21 MED ORDER — NERATINIB MALEATE 40 MG PO TABS: 200.0000 mg | ORAL_TABLET | Freq: Every day | ORAL | 5 refills | Status: DC

## 2021-12-21 NOTE — Progress Notes (Signed)
[facility]       Telephone: [phone]?Fax: [phone]   Oncology Clinical Pharmacist Practitioner Progress Note  Michelle Anthony was contacted via in person visit to discuss her chemotherapy regimen for neratinib which they receive under the care of Dr. Nicholas Lose.    Current treatment regimen and start date Neratinib (07/27/21) Anastrozole (06/01/21)   Interval History She continues on neratinib 5 tablets (200 mg) by mouth daily on days 1 to 28 of a 28-day cycle. This is being given in combination with anastrozole. Therapy is planned to continue until 1 year of therapy in the extended adjuvant setting (07/27/22).  Michelle Anthony was seen today by clinical pharmacy as a follow-up to her neratinib management.  She last saw Dr. Lindi Adie on 10/24/21 and clinical pharmacy on 09/25/21.  She is now on 5 tablets of neratinib daily and at this time is not interested in increasing her dose any further.  She had breast biopsies done recently and both were negative.  Response to Therapy Michelle Anthony is doing well.  She continues to have some diarrhea which is controlled by loperamide and occasional nausea which she has been taking prochlorperazine for.  She does have ondansetron at home but it appears her insurance is limiting the quantity that can be dispensed by her pharmacy which is why she has been taking prochlorperazine first.  She is not reporting any other side effects at this time.  Her BUN is slightly elevated today which may be because she did not drink any water this morning only coffee.  Her serum creatinine remains WNL  She continues to take a calcium supplement and she knows to separate this out from her neratinib by at least 3 hours per manufacturing guidelines.  It does not appear that she has had a bone density scan for some time and this may be something that Dr. Lindi Adie would like done after seeing her next in November.  We will put this at our follow-up section to consider.   [facility] pharmacy will tentatively see Michelle Anthony again in February 2024 and then she will likely see Dr. Lindi Adie 1 more time in April before stopping neratinib. Labs, vitals, treatment parameters, and manufacturer guidelines assessing toxicity were reviewed with Michelle Anthony today. Based on these values, patient is in agreement to continue neratinib therapy at this time.  Allergies No Known Allergies  Vitals    12/21/2021    9:17 AM 10/24/2021    8:42 AM 09/25/2021   12:59 PM  Vitals with BMI  Height '5\' 5"'$  '5\' 5"'$  '5\' 5"'$   Weight 150 lbs 2 oz 153 lbs 151 lbs 5 oz  BMI 24.98 17.61 60.73  Systolic 710 626 948  Diastolic 81 68 74  Pulse 72 65 83   Temp Readings from Last 3 Encounters:  12/21/21 97.8 F (36.6 C) (Tympanic)  10/24/21 97.7 F (36.5 C) (Temporal)  09/25/21 97.9 F (36.6 C) (Tympanic)     Laboratory Data    Latest Ref Rng & Units 12/21/2021    8:46 AM 10/24/2021    8:38 AM 09/25/2021   12:41 PM  CBC EXTENDED  WBC 4.0 - 10.5 K/uL 7.4  4.7  8.5   RBC 3.87 - 5.11 MIL/uL 4.14  4.20  4.29   Hemoglobin 12.0 - 15.0 g/dL 13.1  13.3  13.6   HCT 36.0 - 46.0 % 38.6  39.1  39.6   Platelets 150 - 400 K/uL 249  241  242  NEUT# 1.7 - 7.7 K/uL 5.3  2.5  5.1   Lymph# 0.7 - 4.0 K/uL 1.4  1.6  2.5        Latest Ref Rng & Units 12/21/2021    8:46 AM 10/24/2021    8:38 AM 09/25/2021   12:41 PM  CMP  Glucose 70 - 99 mg/dL 91  93  107   BUN 6 - 20 mg/dL '21  17  17   '$ Creatinine 0.44 - 1.00 mg/dL 0.82  0.79  0.79   Sodium 135 - 145 mmol/L 142  140  140   Potassium 3.5 - 5.1 mmol/L 4.0  3.6  3.5   Chloride 98 - 111 mmol/L 106  105  103   CO2 22 - 32 mmol/L '30  28  30   '$ Calcium 8.9 - 10.3 mg/dL 10.0  9.8  10.1   Total Protein 6.5 - 8.1 g/dL 7.3  7.3  7.4   Total Bilirubin 0.3 - 1.2 mg/dL 0.6  0.6  0.4   Alkaline Phos 38 - 126 U/L 66  68  68   AST 15 - 41 U/L '21  25  22   '$ ALT 0 - 44 U/L '23  28  26     '$ No results found for: "MG"   Adverse Effects Assessment Nausea: Controlled by  prochlorperazine.  She does have ondansetron at home as well if needed. Diarrhea: Controlled with loperamide.  She is not interested in going up to 6 tablets of neratinib at this time and feels comfortable with her decision at staying at 5 tablets  Adherence Michelle Anthony reports missing 0 doses over the past 8 weeks.   Reason for missed dose: N/A Patient was re-educated on importance of adherence.   Access Assessment Michelle Anthony is currently receiving her neratinib through [facility] concerns: None  Medication Reconciliation The patient's medication list was reviewed today with the patient?  Yes New medications or herbal supplements have recently been started?  No Any medications have been discontinued?  No The medication list was updated and reconciled based on the patient's most recent medication list in the electronic medical record (EMR) including herbal products and OTC medications.   Medications Current Outpatient Medications  Medication Sig Dispense Refill   anastrozole (ARIMIDEX) 1 MG tablet Take 1 tablet (1 mg total) by mouth daily. 90 tablet 3   CALCIUM/MAGNESIUM/ZINC FORMULA PO Take 1,000 mg by mouth daily.     COLLAGEN PO Take by mouth daily. Pt takes two scoops of powder in morning and night in coffee and almond milk     Cyanocobalamin (VITAMIN B-12) 6000 MCG SUBL Place under the tongue daily.     ibuprofen (ADVIL) 200 MG tablet Take 3 tablets (600 mg total) by mouth every 8 (eight) hours as needed. (Patient not taking: Reported on 09/25/2021)     loperamide (IMODIUM) 2 MG capsule Take 2 tabs (4 mg) with first loose stool, then 1 tab (2 mg) with each additional loose stool. Do not take more than 8 tabs (16 mg) in a 24-hour period. (Patient not taking: Reported on 09/25/2021) 30 capsule 2   Multiple Vitamin (MULTIVITAMIN) tablet Take 1 tablet by mouth daily.     NERLYNX 40 MG tablet TAKE 5 TABLETS DAILY. TAKE WITH FOOD. START WHEN  CURRENT PRESCRIPTION HAS BEEN EXHUASTED. (TOTAL DOSE 200 MG) 140 tablet 0   ondansetron (ZOFRAN) 8 MG tablet Take 1 tablet (8 mg total) by mouth every 8 (eight)  hours as needed for nausea or vomiting. (Patient not taking: Reported on 09/25/2021) 30 tablet 2   prochlorperazine (COMPAZINE) 10 MG tablet Take 1 tablet (10 mg total) by mouth every 6 (six) hours as needed (Nausea or vomiting). 30 tablet 2   Tretinoin (RETIN-A EX) Apply topically.     UNABLE TO FIND Med Name: [facility]. One scoop daily     UNABLE TO FIND Med Name: Prebiothrive. One scoop daily     No current facility-administered medications for this visit.    Drug-Drug Interactions (DDIs) DDIs were evaluated?  Yes Significant DDIs?  As above, she will separate out her calcium supplement with her neratinib by at least 3 hours per manufacturing guidelines The patient was instructed to speak with their health care provider and/or the oral chemotherapy pharmacist before starting any new drug, including prescription or over the counter, natural / herbal products, or vitamins.  Supportive Care Continue antinausea and antidiarrheal agents as needed  Dosing Assessment Hepatic adjustments needed?  No Renal adjustments needed?  No Toxicity adjustments needed?  No The current dosing regimen is appropriate to continue at this time.  Follow-Up Plan Continue neratinib 5 tablets (200 mg) by mouth daily with food.  Ms. Friebel is not interested in increasing her dose any further Continue anastrozole 1 mg by mouth daily Labs, Dr. Lindi Adie visit, on 02/23/22.  Consider bone density scan as last one on record appears to be in 2011 Labs, pharmacy clinic visit, in mid February 2024.  She will then likely have 1 more visit with Dr. Lindi Adie in April before finishing up her 1 year of neratinib in the extended adjuvant setting  Michelle Anthony participated in the discussion, expressed understanding, and voiced agreement with the above plan. All  questions were answered to her satisfaction. The patient was advised to contact the clinic at [phone] with any questions or concerns prior to her return visit.   I spent 30 minutes assessing and educating the patient.  Raina Mina, RPH-CPP, 12/21/2021  9:43 AM   **Disclaimer: This note was dictated with voice recognition software. Similar sounding words can inadvertently be transcribed and this note may contain transcription errors which may not have been corrected upon publication of note.**

## 2021-12-22 ENCOUNTER — Encounter: Payer: Self-pay | Admitting: Hematology and Oncology

## 2021-12-25 ENCOUNTER — Inpatient Hospital Stay: Payer: BC Managed Care – PPO | Admitting: Hematology and Oncology

## 2021-12-28 ENCOUNTER — Ambulatory Visit: Payer: BC Managed Care – PPO | Attending: Hematology and Oncology

## 2021-12-28 DIAGNOSIS — R252 Cramp and spasm: Secondary | ICD-10-CM

## 2021-12-28 DIAGNOSIS — M25611 Stiffness of right shoulder, not elsewhere classified: Secondary | ICD-10-CM

## 2021-12-28 DIAGNOSIS — Z483 Aftercare following surgery for neoplasm: Secondary | ICD-10-CM | POA: Diagnosis present

## 2021-12-28 DIAGNOSIS — G8929 Other chronic pain: Secondary | ICD-10-CM | POA: Diagnosis present

## 2021-12-28 DIAGNOSIS — M25511 Pain in right shoulder: Secondary | ICD-10-CM | POA: Insufficient documentation

## 2021-12-28 NOTE — Therapy (Addendum)
OUTPATIENT PHYSICAL THERAPY TREATMENT   Patient Name: Michelle Anthony MRN: [id number] DOB:January 11, 1966, 56 y.o., female Today's Date: 12/28/2021   PT End of Session - 12/28/21 1319     Visit Number 11    Date for PT Re-Evaluation 01/25/22    Authorization Type BCBS    PT Start Time 1150    PT Stop Time 1230    PT Time Calculation (min) 40 min    Activity Tolerance Patient tolerated treatment well    Behavior During Therapy WFL for tasks assessed/performed                       Past Medical History:  Diagnosis Date   Erythema nodosum    Family history of breast cancer    Family history of melanoma    Frequent UTI    Frozen shoulder    History of radiation therapy 06/23/20-07/22/20   IMRT to Right Breast, Dr. Gery Pray    STD (sexually transmitted disease)    H/O HSV II   Past Surgical History:  Procedure Laterality Date   BOTOX INJECTION     BREAST LUMPECTOMY WITH RADIOACTIVE SEED AND SENTINEL LYMPH NODE BIOPSY Right 05/17/2020   Procedure: RIGHT BREAST LUMPECTOMY WITH RADIOACTIVE SEED x5 AND SENTINEL LYMPH NODE MAPPING;  Surgeon: Erroll Luna, MD;  Location: [facility];  Service: General;  Laterality: Right;   CLOSED MANIPULATION SHOULDER     PORTACATH PLACEMENT Right 01/05/2020   Procedure: INSERTION PORT-A-CATH WITH ULTRASOUND GUIDANCE;  Surgeon: Erroll Luna, MD;  Location: [facility];  Service: General;  Laterality: Right;   Patient Active Problem List   Diagnosis Date Noted   Family history of malignant neoplasm of gastrointestinal tract 08/30/2020   Colon cancer screening 08/30/2020   Port-A-Cath in place 01/13/2020   Genetic testing 01/01/2020   Family history of breast cancer    Family history of melanoma    Malignant neoplasm of upper-outer quadrant of right breast in female, estrogen receptor positive ([place]) 12/16/2019   Abnormal mammogram 12/16/2019   Frozen shoulder 12/16/2018   Arthritis of carpometacarpal  ([place]) joint of thumb 11/01/2017   Fatigue 11/01/2017    PCP: Suzanna Obey, MD  REFERRING PROVIDER: Nicholas Lose, MD  REFERRING DIAG: Right shoulder pain, unspecified chronicity  - Primary  THERAPY DIAG:  Chronic right shoulder pain  Stiffness of right shoulder, not elsewhere classified  Cramp and spasm  Aftercare following surgery for neoplasm  Rationale for Evaluation and Treatment Rehabilitation  ONSET DATE: 1 year ago  SUBJECTIVE:  SUBJECTIVE STATEMENT: I have had to have mammogram and biopsy.  Results were benign.  I haven't been good about doing my exercises as well.  Things are feeling good though.  75% overall improvement   PERTINENT HISTORY:  Rt breast cancer: radiation 06/23/20-07/22/20 Rt lumpectomy 05/2020 Lt frozen shoulder with manipulation  PAIN:  Are you having pain? Yes: NPRS scale: 0/10 Pain location: Rt shoulder and arm Pain description: shooting Aggravating factors: sleep at night, getting dressed, reaching overhead   Relieving factors: heat, rest, ice, massage   PRECAUTIONS: Other: Breast cancer and radiation to Rt breast  WEIGHT BEARING RESTRICTIONS No  FALLS:  Has patient fallen in last 6 months? No  LIVING ENVIRONMENT: Lives with: lives with their family  OCCUPATION: desk work  PLOF: Independent  PATIENT GOALS reduce Rt shoulder pain, improve Rt UE A/ROM  OBJECTIVE:  DIAGNOSTIC FINDINGS:  None   PATIENT SURVEYS:  FOTO 44 (goal is 42) 10/31/21: 65 (goal is 59) 12/28/21: 63  COGNITION: Overall cognitive status: Within functional limits for tasks assessed   SENSATION: WFL  POSTURE: No Significant postural limitations  PALPATION: Palpable trigger points and tension over lateral rib cage, pecs and lats on the Rt.  Global tenderness  over Rt glenohumeral joint.  Reduced Rt glenohumeral joint mobility in all directions,    CERVICAL ROM:  WFLs without pain  UPPER EXTREMITY ROM:  Active ROM Right eval Right  09/27/21 Right 10/12/21 Right  10/18/21 Right  10/31/21 Right 11/27/21 Right 12/28/21 Left eval  Shoulder flexion 100 120 133 Seated 105,supine 133 118 active standing  130 P/ROM  130 Full without pain  Shoulder extension          Shoulder abduction 65 83 85  85 A/ROM  90 P/ROM  115   Shoulder adduction          Shoulder extension          Shoulder internal rotation To Rt buttock   L4  L2 T12 T6   Shoulder external rotation To Rt upper trap 45 55  To T3 in standing, 55 in supine P/ROM  T3   Elbow flexion          Elbow extension          Wrist flexion          Wrist extension          Wrist ulnar deviation          Wrist radial deviation          Wrist pronation          Wrist supination           (Blank rows = not tested) PROM Rt: flexion 100, ER 35, IR 25 with pain and hard end feel  UPPER EXTREMITY MMT:  MMT Right eval Right 12/28/21 Left eval  Shoulder flexion 4-/5 4+ 5/5 throughout   Shoulder extension 4/5    Shoulder abduction 3+/5 4   Shoulder adduction     Shoulder extension     Shoulder internal rotation 4+/5 4+   Shoulder external rotation 3+/5 4                                                           (Blank rows = not tested)  SOZO screen was completed today.  She measured -4.2 which is an insignificant change from baseline, indicating no subclinical lymphedema. We will continue to monitor her for subclinical lymphedema using the SOZO machine every 3 months. [name], PT 11/27/2021  12:47   TODAY'S TREATMENT:  Treatment on date: 12/28/21 Demo of 3 way raises for Rt shoulder strength- added to HEP  Trigger Point Dry-Needling  Treatment instructions: Expect mild to moderate muscle soreness. S/S of pneumothorax if dry needled over a lung field, and to seek immediate  medical attention should they occur. Patient verbalized understanding of these instructions and education.  Patient Consent Given: Yes Education handout provided: Yes Muscles treated: Rt lats at axilla, rhomboids and upper trap Treatment response/outcome: twitch response   Skilled palpation and monitoring by PT during dry needling  Manual: P/ROM and manual elongation to lats and pecs, and rhomboids  Treatment on date: 12/06/21 Supine on foam roll: arm out to stretch pecs Supine yellow theraband: horizontal abduction, ER with band- added to HEP  Trigger Point Dry-Needling  Treatment instructions: Expect mild to moderate muscle soreness. S/S of pneumothorax if dry needled over a lung field, and to seek immediate medical attention should they occur. Patient verbalized understanding of these instructions and education.  Patient Consent Given: Yes Education handout provided: Yes Muscles treated: Rt lats at axilla, rhomboids and upper trap Treatment response/outcome: twitch response   Skilled palpation and monitoring by PT during dry needling  Manual: P/ROM and manual elongation to lats and pecs, and rhomboids  Treatment on date: 11/27/21  Trigger Point Dry-Needling  Treatment instructions: Expect mild to moderate muscle soreness. S/S of pneumothorax if dry needled over a lung field, and to seek immediate medical attention should they occur. Patient verbalized understanding of these instructions and education.  Patient Consent Given: Yes Education handout provided: Yes Muscles treated: Rt lats at axilla, rhomboids and upper trap Treatment response/outcome: twitch response   Skilled palpation and monitoring by PT during dry needling  Manual: P/ROM and manual elongation to lats and pecs, and rhomboids   PATIENT EDUCATION:  Education details: Access Code: [email or web address] Person educated: Patient Education method: Explanation, Demonstration, and Handouts Education comprehension: verbalized  understanding and returned demonstration   HOME EXERCISE PROGRAM: Access Code: [email or web address] URL: [email or web address] Date: 12/28/2021 Prepared by: Claiborne Billings  Exercises - Seated Shoulder Flexion AAROM with Pulley Behind  - 1 x daily - 7 x weekly - 3 sets - 10 reps - Seated Shoulder Scaption AAROM with Pulley at Side  - 1 x daily - 7 x weekly - 3 sets - 10 reps - Standing Shoulder Internal Rotation AAROM with Pulley  - 1 x daily - 7 x weekly - 3 sets - 10 reps - Supine Shoulder Flexion AAROM with Hands Clasped  - 3 x daily - 7 x weekly - 1 sets - 10 reps - 10 hold - Seated Shoulder Flexion Towel Slide at Table Top  - 3 x daily - 7 x weekly - 1 sets - 10 reps - 10 hold - Supine Shoulder External Rotation with Dowel  - 2 x daily - 7 x weekly - 1 sets - 10 reps - 10 hold - Supine Shoulder Horizontal Abduction with Resistance  - 2 x daily - 7 x weekly - 2 sets - 10 reps - Supine Bilateral Shoulder External Rotation with Resistance  - 2 x daily - 7 x weekly - 2 sets - 10 reps - Supine Chest Stretch on Foam Roll  - 2 x daily - 7 x weekly -  1 sets - 1 reps - 3-5 min hold - Seated Shoulder Abduction - Palms Down  - 2 x daily - 7 x weekly - 2 sets - 10 reps - Seated Shoulder Flexion  - 2 x daily - 7 x weekly - 2 sets - 10 reps - Seated Shoulder Scaption  - 2 x daily - 7 x weekly - 2 sets - 10 reps ASSESSMENT:  CLINICAL IMPRESSION:   PT initiated exercise for postural strength.  Pt required frequent tactile cues for scapular depression.  Pt with tension and trigger point in Rt scapula and posterior shoulder and had good response to DN with twitch response and improved tissue mobility after manual therapy.  Patient will benefit from skilled PT to address the below impairments and improve overall function.    OBJECTIVE IMPAIRMENTS decreased ROM, decreased strength, increased fascial restrictions, increased muscle spasms, impaired UE functional use, postural dysfunction, and pain.   ACTIVITY  LIMITATIONS carrying, lifting, and transfers  PARTICIPATION LIMITATIONS: cleaning and laundry  PERSONAL FACTORS 1-2 comorbidities: Rt breast cancer with lumpectomy and node removal, Lt frozen shoulder   are also affecting patient's functional outcome.   REHAB POTENTIAL: Good  CLINICAL DECISION MAKING: Stable/uncomplicated  EVALUATION COMPLEXITY: Low   GOALS: Goals reviewed with patient? Yes  SHORT TERM GOALS: Target date: 10/10/21   Be independent in initial HEP Baseline:  Goal status: MET (09/27/21)  2.  Demonstrate Rt shoulder A/ROM flexion to > or = to 110 degrees to improve overhead reaching  Baseline: 120 (09/27/21) Goal status: MET  3.  Demonstrate Rt shoulder IR A/ROM to L3 to improve dressing  Baseline: L4 (11/20/21) Goal status: In progress   4.  Report > or = to 25% reduction in Rt shoulder pain with dressing and reaching overhead  Baseline:  Goal status: MET (10/12/21)    LONG TERM GOALS: Target date: 01/25/22  Be independent in advanced HEP Baseline:  Goal status: In progress   2.  Improve FOTO to > or = to 68 Baseline: 69 (12/28/21) Goal status: MET  3.  Demonstrate Rt shoulder A/ROM flexion to > or = to 135 degrees to improve overhead reaching  Baseline: A/ROM (12/28/21) Goal status: revised   4.  Demonstrate Rt shoulder A/ROM IR to T11 to improve dressing  Baseline: T7 (12/28/21) Goal status: MET  5.  Demonstrate Rt shoulder A/ROM ER to occiput to T5 to assist with self-care Baseline: T3 (12/28/21) Goal status: revised   6.  Report > or = to 70% reduction in Rt shoulder pain with use  Baseline: 75%  Goal status: MET 7. Demonstrate 4+/5 Rt shoulder strength to improve endurance Baseline: see above Goal Status: MET   PLAN: PT FREQUENCY: 1x/week  PT DURATION: 4 weeks   PLANNED INTERVENTIONS: Therapeutic exercises, Therapeutic activity, Neuromuscular re-education, Patient/Family education, Joint mobilization, Dry Needling, Cryotherapy, Moist  heat, scar mobilization, Taping, and Manual therapy  PLAN FOR NEXT SESSION: Rt shoulder ROM, DN and tissue mobility. 2 more sessions probable      Sigurd Sos, PT 12/28/21 1:21 PM  [facility] [address] [place], [place] [address] Phone # [phone] Fax [phone]

## 2021-12-28 NOTE — Addendum Note (Signed)
Addended by: Danie Binder on: 12/28/2021 02:30 PM   Modules accepted: Orders

## 2022-01-03 ENCOUNTER — Ambulatory Visit: Payer: BC Managed Care – PPO

## 2022-01-03 DIAGNOSIS — R252 Cramp and spasm: Secondary | ICD-10-CM

## 2022-01-03 DIAGNOSIS — M25611 Stiffness of right shoulder, not elsewhere classified: Secondary | ICD-10-CM

## 2022-01-03 DIAGNOSIS — G8929 Other chronic pain: Secondary | ICD-10-CM

## 2022-01-03 DIAGNOSIS — M25511 Pain in right shoulder: Secondary | ICD-10-CM | POA: Diagnosis not present

## 2022-01-03 NOTE — Therapy (Signed)
OUTPATIENT PHYSICAL THERAPY TREATMENT   Patient Name: Michelle Anthony MRN: [id number] DOB:1965-04-29, 56 y.o., female Today's Date: 01/03/2022   PT End of Session - 01/03/22 1313     Visit Number 12    Date for PT Re-Evaluation 01/25/22    Authorization Type BCBS    PT Start Time 1233    PT Stop Time 1313    PT Time Calculation (min) 40 min    Activity Tolerance Patient tolerated treatment well    Behavior During Therapy WFL for tasks assessed/performed                        Past Medical History:  Diagnosis Date   Erythema nodosum    Family history of breast cancer    Family history of melanoma    Frequent UTI    Frozen shoulder    History of radiation therapy 06/23/20-07/22/20   IMRT to Right Breast, Dr. Gery Pray    STD (sexually transmitted disease)    H/O HSV II   Past Surgical History:  Procedure Laterality Date   BOTOX INJECTION     BREAST LUMPECTOMY WITH RADIOACTIVE SEED AND SENTINEL LYMPH NODE BIOPSY Right 05/17/2020   Procedure: RIGHT BREAST LUMPECTOMY WITH RADIOACTIVE SEED x5 AND SENTINEL LYMPH NODE MAPPING;  Surgeon: Erroll Luna, MD;  Location: [facility];  Service: General;  Laterality: Right;   CLOSED MANIPULATION SHOULDER     PORTACATH PLACEMENT Right 01/05/2020   Procedure: INSERTION PORT-A-CATH WITH ULTRASOUND GUIDANCE;  Surgeon: Erroll Luna, MD;  Location: [facility];  Service: General;  Laterality: Right;   Patient Active Problem List   Diagnosis Date Noted   Family history of malignant neoplasm of gastrointestinal tract 08/30/2020   Colon cancer screening 08/30/2020   Port-A-Cath in place 01/13/2020   Genetic testing 01/01/2020   Family history of breast cancer    Family history of melanoma    Malignant neoplasm of upper-outer quadrant of right breast in female, estrogen receptor positive ([place]) 12/16/2019   Abnormal mammogram 12/16/2019   Frozen shoulder 12/16/2018   Arthritis of  carpometacarpal ([place]) joint of thumb 11/01/2017   Fatigue 11/01/2017    PCP: Suzanna Obey, MD  REFERRING PROVIDER: Nicholas Lose, MD  REFERRING DIAG: Right shoulder pain, unspecified chronicity  - Primary  THERAPY DIAG:  Chronic right shoulder pain  Stiffness of right shoulder, not elsewhere classified  Cramp and spasm  Rationale for Evaluation and Treatment Rehabilitation  ONSET DATE: 1 year ago  SUBJECTIVE:  SUBJECTIVE STATEMENT: I saw Dr Noemi Chapel and he was pleased with progress.  He agreed with our plan.   PERTINENT HISTORY:  Rt breast cancer: radiation 06/23/20-07/22/20 Rt lumpectomy 05/2020 Lt frozen shoulder with manipulation  PAIN:  Are you having pain? Yes: NPRS scale: 1/10 Pain location: Rt shoulder and arm Pain description: shooting Aggravating factors: sleep at night, getting dressed, reaching overhead   Relieving factors: heat, rest, ice, massage   PRECAUTIONS: Other: Breast cancer and radiation to Rt breast  WEIGHT BEARING RESTRICTIONS No  FALLS:  Has patient fallen in last 6 months? No  LIVING ENVIRONMENT: Lives with: lives with their family  OCCUPATION: desk work  PLOF: Independent  PATIENT GOALS reduce Rt shoulder pain, improve Rt UE A/ROM  OBJECTIVE:  DIAGNOSTIC FINDINGS:  None   PATIENT SURVEYS:  FOTO 44 (goal is 70) 10/31/21: 65 (goal is 78) 12/28/21: 75  COGNITION: Overall cognitive status: Within functional limits for tasks assessed   SENSATION: WFL  POSTURE: No Significant postural limitations  PALPATION: Palpable trigger points and tension over lateral rib cage, pecs and lats on the Rt.  Global tenderness over Rt glenohumeral joint.  Reduced Rt glenohumeral joint mobility in all directions,    CERVICAL ROM:  WFLs without  pain  UPPER EXTREMITY ROM:  Active ROM Right eval Right  09/27/21 Right 10/12/21 Right  10/18/21 Right  10/31/21 Right 11/27/21 Right 12/28/21 Left eval  Shoulder flexion 100 120 133 Seated 105,supine 133 118 active standing  130 P/ROM  130 Full without pain  Shoulder extension          Shoulder abduction 65 83 85  85 A/ROM  90 P/ROM  115   Shoulder adduction          Shoulder extension          Shoulder internal rotation To Rt buttock   L4  L2 T12 T6   Shoulder external rotation To Rt upper trap 45 55  To T3 in standing, 55 in supine P/ROM  T3   Elbow flexion          Elbow extension          Wrist flexion          Wrist extension          Wrist ulnar deviation          Wrist radial deviation          Wrist pronation          Wrist supination           (Blank rows = not tested) PROM Rt: flexion 100, ER 35, IR 25 with pain and hard end feel  UPPER EXTREMITY MMT:  MMT Right eval Right 12/28/21 Left eval  Shoulder flexion 4-/5 4+ 5/5 throughout   Shoulder extension 4/5    Shoulder abduction 3+/5 4   Shoulder adduction     Shoulder extension     Shoulder internal rotation 4+/5 4+   Shoulder external rotation 3+/5 4                                                           (Blank rows = not tested)  SOZO screen was completed today. She measured -4.2 which is an insignificant change from baseline, indicating no subclinical lymphedema. We will continue  to monitor her for subclinical lymphedema using the SOZO machine every 3 months. [name], PT 11/27/2021  12:47   TODAY'S TREATMENT:  Treatment on date: 01/03/22 3 way raises 1# added x10- good demo [place] 4 ways: red band x10 each- added to HEP  Trigger Point Dry-Needling  Treatment instructions: Expect mild to moderate muscle soreness. S/S of pneumothorax if dry needled over a lung field, and to seek immediate medical attention should they occur. Patient verbalized understanding of these instructions and  education.  Patient Consent Given: Yes Education handout provided: Yes Muscles treated: Rt lats at axilla, lats rhomboids and upper trap Treatment response/outcome: twitch response   Skilled palpation and monitoring by PT during dry needling  Manual: P/ROM and manual elongation to lats and pecs, and rhomboids  Treatment on date: 12/28/21 Demo of 3 way raises for Rt shoulder strength- added to HEP  Trigger Point Dry-Needling  Treatment instructions: Expect mild to moderate muscle soreness. S/S of pneumothorax if dry needled over a lung field, and to seek immediate medical attention should they occur. Patient verbalized understanding of these instructions and education.  Patient Consent Given: Yes Education handout provided: Yes Muscles treated: Rt lats at axilla, rhomboids and upper trap Treatment response/outcome: twitch response   Skilled palpation and monitoring by PT during dry needling  Manual: P/ROM and manual elongation to lats and pecs, and rhomboids  Treatment on date: 12/06/21 Supine on foam roll: arm out to stretch pecs Supine yellow theraband: horizontal abduction, ER with band- added to HEP  Trigger Point Dry-Needling  Treatment instructions: Expect mild to moderate muscle soreness. S/S of pneumothorax if dry needled over a lung field, and to seek immediate medical attention should they occur. Patient verbalized understanding of these instructions and education.  Patient Consent Given: Yes Education handout provided: Yes Muscles treated: Rt lats at axilla, rhomboids and upper trap Treatment response/outcome: twitch response   Skilled palpation and monitoring by PT during dry needling  Manual: P/ROM and manual elongation to lats and pecs, and rhomboids  PATIENT EDUCATION:  Education details: Access Code: [email or web address] Person educated: Patient Education method: Explanation, Demonstration, and Handouts Education comprehension: verbalized understanding and returned  demonstration   HOME EXERCISE PROGRAM: Access Code: [email or web address] URL: [email or web address] Date: 01/03/2022 Prepared by: Claiborne Billings  Exercises - Seated Shoulder Flexion AAROM with Pulley Behind  - 1 x daily - 7 x weekly - 3 sets - 10 reps - Seated Shoulder Scaption AAROM with Pulley at Side  - 1 x daily - 7 x weekly - 3 sets - 10 reps - Standing Shoulder Internal Rotation AAROM with Pulley  - 1 x daily - 7 x weekly - 3 sets - 10 reps - Supine Shoulder Flexion AAROM with Hands Clasped  - 3 x daily - 7 x weekly - 1 sets - 10 reps - 10 hold - Seated Shoulder Flexion Towel Slide at Table Top  - 3 x daily - 7 x weekly - 1 sets - 10 reps - 10 hold - Supine Shoulder External Rotation with Dowel  - 2 x daily - 7 x weekly - 1 sets - 10 reps - 10 hold - Supine Shoulder Horizontal Abduction with Resistance  - 2 x daily - 7 x weekly - 2 sets - 10 reps - Supine Bilateral Shoulder External Rotation with Resistance  - 2 x daily - 7 x weekly - 2 sets - 10 reps - Supine Chest Stretch on Foam Roll  - 2 x daily - 7 x weekly -  1 sets - 1 reps - 3-5 min hold - Seated Shoulder Abduction - Palms Down  - 2 x daily - 7 x weekly - 2 sets - 10 reps - Seated Shoulder Flexion  - 2 x daily - 7 x weekly - 2 sets - 10 reps - Seated Shoulder Scaption  - 2 x daily - 7 x weekly - 2 sets - 10 reps - Standing Shoulder Flexion with Posterior Anchored Resistance  - 1 x daily - 7 x weekly - 2 sets - 10 reps - Single Arm Shoulder Extension with Anchored Resistance  - 1 x daily - 7 x weekly - 2 sets - 10 reps - Shoulder Internal Rotation with Resistance  - 1 x daily - 7 x weekly - 2 sets - 10 reps - Shoulder External Rotation with Anchored Resistance  - 2 x daily - 7 x weekly - 2 sets - 10 reps ASSESSMENT:  CLINICAL IMPRESSION:   Pt is doing well with 3 way raises and PT added [place] with red band to HEP.  Pt required fewer cues for scapular depression and demonstrated good form with all strength today.  Pt with tension  and trigger point in Rt scapula, lats and posterior shoulder and had good response to DN with twitch response and improved tissue mobility after manual therapy.  Patient will benefit from skilled PT to address the below impairments and improve overall function.    OBJECTIVE IMPAIRMENTS decreased ROM, decreased strength, increased fascial restrictions, increased muscle spasms, impaired UE functional use, postural dysfunction, and pain.   ACTIVITY LIMITATIONS carrying, lifting, and transfers  PARTICIPATION LIMITATIONS: cleaning and laundry  PERSONAL FACTORS 1-2 comorbidities: Rt breast cancer with lumpectomy and node removal, Lt frozen shoulder   are also affecting patient's functional outcome.   REHAB POTENTIAL: Good  CLINICAL DECISION MAKING: Stable/uncomplicated  EVALUATION COMPLEXITY: Low   GOALS: Goals reviewed with patient? Yes  SHORT TERM GOALS: Target date: 10/10/21   Be independent in initial HEP Baseline:  Goal status: MET (09/27/21)  2.  Demonstrate Rt shoulder A/ROM flexion to > or = to 110 degrees to improve overhead reaching  Baseline: 120 (09/27/21) Goal status: MET  3.  Demonstrate Rt shoulder IR A/ROM to L3 to improve dressing  Baseline: L4 (11/20/21) Goal status: In progress   4.  Report > or = to 25% reduction in Rt shoulder pain with dressing and reaching overhead  Baseline:  Goal status: MET (10/12/21)    LONG TERM GOALS: Target date: 01/25/22  Be independent in advanced HEP Baseline:  Goal status: In progress   2.  Improve FOTO to > or = to 68 Baseline: 69 (12/28/21) Goal status: MET  3.  Demonstrate Rt shoulder A/ROM flexion to > or = to 135 degrees to improve overhead reaching  Baseline: A/ROM (12/28/21) Goal status: revised   4.  Demonstrate Rt shoulder A/ROM IR to T11 to improve dressing  Baseline: T7 (12/28/21) Goal status: MET  5.  Demonstrate Rt shoulder A/ROM ER to occiput to T5 to assist with self-care Baseline: T3 (12/28/21) Goal  status: revised   6.  Report > or = to 70% reduction in Rt shoulder pain with use  Baseline: 75%  Goal status: MET 7. Demonstrate 4+/5 Rt shoulder strength to improve endurance Baseline: see above Goal Status: MET   PLAN: PT FREQUENCY: 1x/week  PT DURATION: 4 weeks   PLANNED INTERVENTIONS: Therapeutic exercises, Therapeutic activity, Neuromuscular re-education, Patient/Family education, Joint mobilization, Dry Needling, Cryotherapy, Moist heat, scar  mobilization, Taping, and Manual therapy  PLAN FOR NEXT SESSION: Rt shoulder ROM, DN and tissue mobility. 1 more session probable.    Sigurd Sos, PT 01/03/22 1:16 PM  [facility] [address], [place],  [address] Phone # [phone] Fax [phone]

## 2022-01-10 ENCOUNTER — Ambulatory Visit: Payer: BC Managed Care – PPO

## 2022-01-10 DIAGNOSIS — G8929 Other chronic pain: Secondary | ICD-10-CM

## 2022-01-10 DIAGNOSIS — M25511 Pain in right shoulder: Secondary | ICD-10-CM | POA: Diagnosis not present

## 2022-01-10 DIAGNOSIS — M25611 Stiffness of right shoulder, not elsewhere classified: Secondary | ICD-10-CM

## 2022-01-10 DIAGNOSIS — R252 Cramp and spasm: Secondary | ICD-10-CM

## 2022-01-10 NOTE — Therapy (Signed)
OUTPATIENT PHYSICAL THERAPY TREATMENT   Patient Name: Michelle Anthony MRN: [id number] DOB:07-22-1965, 56 y.o., female Today's Date: 01/10/2022   PT End of Session - 01/10/22 1304     Visit Number 13    PT Start Time 1228    PT Stop Time 1304    PT Time Calculation (min) 36 min    Activity Tolerance Patient tolerated treatment well    Behavior During Therapy [facility] for tasks assessed/performed                         Past Medical History:  Diagnosis Date   Erythema nodosum    Family history of breast cancer    Family history of melanoma    Frequent UTI    Frozen shoulder    History of radiation therapy 06/23/20-07/22/20   IMRT to Right Breast, Dr. Gery Pray    STD (sexually transmitted disease)    H/O HSV II   Past Surgical History:  Procedure Laterality Date   BOTOX INJECTION     BREAST LUMPECTOMY WITH RADIOACTIVE SEED AND SENTINEL LYMPH NODE BIOPSY Right 05/17/2020   Procedure: RIGHT BREAST LUMPECTOMY WITH RADIOACTIVE SEED x5 AND SENTINEL LYMPH NODE MAPPING;  Surgeon: Erroll Luna, MD;  Location: [facility];  Service: General;  Laterality: Right;   CLOSED MANIPULATION SHOULDER     PORTACATH PLACEMENT Right 01/05/2020   Procedure: INSERTION PORT-A-CATH WITH ULTRASOUND GUIDANCE;  Surgeon: Erroll Luna, MD;  Location: [facility];  Service: General;  Laterality: Right;   Patient Active Problem List   Diagnosis Date Noted   Family history of malignant neoplasm of gastrointestinal tract 08/30/2020   Colon cancer screening 08/30/2020   Port-A-Cath in place 01/13/2020   Genetic testing 01/01/2020   Family history of breast cancer    Family history of melanoma    Malignant neoplasm of upper-outer quadrant of right breast in female, estrogen receptor positive ([place]) 12/16/2019   Abnormal mammogram 12/16/2019   Frozen shoulder 12/16/2018   Arthritis of carpometacarpal ([place]) joint of thumb 11/01/2017   Fatigue 11/01/2017     PCP: Suzanna Obey, MD  REFERRING PROVIDER: Nicholas Lose, MD  REFERRING DIAG: Right shoulder pain, unspecified chronicity  - Primary  THERAPY DIAG:  Chronic right shoulder pain  Stiffness of right shoulder, not elsewhere classified  Cramp and spasm  Rationale for Evaluation and Treatment Rehabilitation  ONSET DATE: 1 year ago  SUBJECTIVE:  SUBJECTIVE STATEMENT: Ready for discharge  PERTINENT HISTORY:  Rt breast cancer: radiation 06/23/20-07/22/20 Rt lumpectomy 05/2020 Lt frozen shoulder with manipulation  PAIN:  Are you having pain? Yes: NPRS scale: 0/10 Pain location: Rt shoulder and arm Pain description: shooting Aggravating factors: sleep at night, getting dressed, reaching overhead   Relieving factors: heat, rest, ice, massage   PRECAUTIONS: Other: Breast cancer and radiation to Rt breast  WEIGHT BEARING RESTRICTIONS No  FALLS:  Has patient fallen in last 6 months? No  LIVING ENVIRONMENT: Lives with: lives with their family  OCCUPATION: desk work  PLOF: Independent  PATIENT GOALS reduce Rt shoulder pain, improve Rt UE A/ROM  OBJECTIVE:  DIAGNOSTIC FINDINGS:  None   PATIENT SURVEYS:  FOTO 44 (goal is 14) 10/31/21: 65 (goal is 96) 12/28/21: 20  COGNITION: Overall cognitive status: Within functional limits for tasks assessed   SENSATION: WFL  POSTURE: No Significant postural limitations  PALPATION: Palpable trigger points and tension over lateral rib cage, pecs and lats on the Rt.  Global tenderness over Rt glenohumeral joint.  Reduced Rt glenohumeral joint mobility in all directions,    CERVICAL ROM:  WFLs without pain  UPPER EXTREMITY ROM:  Active ROM Right eval Right  09/27/21 Right 10/12/21 Right  10/18/21 Right  10/31/21 Right 11/27/21  Right 12/28/21 Right 01/10/22 Left eval  Shoulder flexion 100 120 133 Seated 105,supine 133 118 active standing  130 P/ROM  130 140-A Full without pain  Shoulder extension           Shoulder abduction 65 83 85  85 A/ROM  90 P/ROM  115 115   Shoulder adduction           Shoulder extension           Shoulder internal rotation To Rt buttock   L4  L2 T12 T6 T5   Shoulder external rotation To Rt upper trap 45 55  To T3 in standing, 55 in supine P/ROM  T3 T3   Elbow flexion           Elbow extension           Wrist flexion           Wrist extension           Wrist ulnar deviation           Wrist radial deviation           Wrist pronation           Wrist supination            (Blank rows = not tested) PROM Rt: flexion 100, ER 35, IR 25 with pain and hard end feel  UPPER EXTREMITY MMT:  MMT Right eval Right 12/28/21 Right 01/10/22 Left eval  Shoulder flexion 4-/5 4+ 5 5/5 throughout   Shoulder extension 4/5     Shoulder abduction 3+/_0 Shoulder adduction      Shoulder extension      Shoulder internal rotation 4+/5 4+ 5   Shoulder external rotation 3+/5 4 4+                                                                      (Blank rows = not tested)  SOZO screen was completed today. She measured -4.2 which is an insignificant change from baseline, indicating no subclinical lymphedema. We will continue to monitor her for subclinical lymphedema using the SOZO machine every 3 months. [name], PT 11/27/2021  12:47   TODAY'S TREATMENT:  Treatment on date: 01/10/22  [place] 4 ways: red band x10 each- added to HEP- verbal and tactile cues for technique  Trigger Point Dry-Needling  Treatment instructions: Expect mild to moderate muscle soreness. S/S of pneumothorax if dry needled over a lung field, and to seek immediate medical attention should they occur. Patient verbalized understanding of these instructions and education.  Patient Consent Given: Yes Education  handout provided: Yes Muscles treated: Rt lats at axilla, lats rhomboids and upper trap Treatment response/outcome: twitch response   Skilled palpation and monitoring by PT during dry needling  Manual: P/ROM and manual elongation to lats and pecs, and rhomboids  Treatment on date: 01/03/22 3 way raises 1# added x10- good demo [place] 4 ways: red band x10 each- added to HEP  Trigger Point Dry-Needling  Treatment instructions: Expect mild to moderate muscle soreness. S/S of pneumothorax if dry needled over a lung field, and to seek immediate medical attention should they occur. Patient verbalized understanding of these instructions and education.  Patient Consent Given: Yes Education handout provided: Yes Muscles treated: Rt lats at axilla, lats rhomboids and upper trap Treatment response/outcome: twitch response   Skilled palpation and monitoring by PT during dry needling  Manual: P/ROM and manual elongation to lats and pecs, and rhomboids  Treatment on date: 12/28/21 Demo of 3 way raises for Rt shoulder strength- added to HEP  Trigger Point Dry-Needling  Treatment instructions: Expect mild to moderate muscle soreness. S/S of pneumothorax if dry needled over a lung field, and to seek immediate medical attention should they occur. Patient verbalized understanding of these instructions and education.  Patient Consent Given: Yes Education handout provided: Yes Muscles treated: Rt lats at axilla, rhomboids and upper trap Treatment response/outcome: twitch response   Skilled palpation and monitoring by PT during dry needling  Manual: P/ROM and manual elongation to lats and pecs, and rhomboids  Treatment on date: 12/06/21 Supine on foam roll: arm out to stretch pecs Supine yellow theraband: horizontal abduction, ER with band- added to HEP  Trigger Point Dry-Needling  Treatment instructions: Expect mild to moderate muscle soreness. S/S of pneumothorax if dry needled over a lung field, and  to seek immediate medical attention should they occur. Patient verbalized understanding of these instructions and education.  Patient Consent Given: Yes Education handout provided: Yes Muscles treated: Rt lats at axilla, rhomboids and upper trap Treatment response/outcome: twitch response   Skilled palpation and monitoring by PT during dry needling  Manual: P/ROM and manual elongation to lats and pecs, and rhomboids  PATIENT EDUCATION:  Education details: Access Code: [email or web address] Person educated: Patient Education method: Explanation, Demonstration, and Handouts Education comprehension: verbalized understanding and returned demonstration   HOME EXERCISE PROGRAM: Access Code: [email or web address] URL: [email or web address] Date: 01/03/2022 Prepared by: Claiborne Billings  Exercises - Seated Shoulder Flexion AAROM with Pulley Behind  - 1 x daily - 7 x weekly - 3 sets - 10 reps - Seated Shoulder Scaption AAROM with Pulley at Side  - 1 x daily - 7 x weekly - 3 sets - 10 reps - Standing Shoulder Internal Rotation AAROM with Pulley  - 1 x daily - 7 x weekly - 3 sets - 10 reps - Supine Shoulder Flexion AAROM with Hands Clasped  -  3 x daily - 7 x weekly - 1 sets - 10 reps - 10 hold - Seated Shoulder Flexion Towel Slide at Table Top  - 3 x daily - 7 x weekly - 1 sets - 10 reps - 10 hold - Supine Shoulder External Rotation with Dowel  - 2 x daily - 7 x weekly - 1 sets - 10 reps - 10 hold - Supine Shoulder Horizontal Abduction with Resistance  - 2 x daily - 7 x weekly - 2 sets - 10 reps - Supine Bilateral Shoulder External Rotation with Resistance  - 2 x daily - 7 x weekly - 2 sets - 10 reps - Supine Chest Stretch on Foam Roll  - 2 x daily - 7 x weekly - 1 sets - 1 reps - 3-5 min hold - Seated Shoulder Abduction - Palms Down  - 2 x daily - 7 x weekly - 2 sets - 10 reps - Seated Shoulder Flexion  - 2 x daily - 7 x weekly - 2 sets - 10 reps - Seated Shoulder Scaption  - 2 x daily - 7 x weekly - 2 sets - 10  reps - Standing Shoulder Flexion with Posterior Anchored Resistance  - 1 x daily - 7 x weekly - 2 sets - 10 reps - Single Arm Shoulder Extension with Anchored Resistance  - 1 x daily - 7 x weekly - 2 sets - 10 reps - Shoulder Internal Rotation with Resistance  - 1 x daily - 7 x weekly - 2 sets - 10 reps - Shoulder External Rotation with Anchored Resistance  - 2 x daily - 7 x weekly - 2 sets - 10 reps ASSESSMENT:  CLINICAL IMPRESSION: Pt is ready to D/C to HEP today.  She has made steady strength and A/ROM gains and has HEP in place to continue to make gains. Pt is independent and compliant with her HEP for strength and flexibility.  Session focused on review of HEP and manual therapy to address soft tissue restrictions in the thoracic spine.  Pt with good twitch response and improved tissue mobility after manual therapy and demonstrates significant improvement in overall number and size of trigger points.     GOALS: Goals reviewed with patient? Yes  SHORT TERM GOALS: Target date: 10/10/21   Be independent in initial HEP Baseline:  Goal status: MET (09/27/21)  2.  Demonstrate Rt shoulder A/ROM flexion to > or = to 110 degrees to improve overhead reaching  Baseline: 120 (09/27/21) Goal status: MET  3.  Demonstrate Rt shoulder IR A/ROM to L3 to improve dressing  Baseline: L4 (11/20/21) Goal status: In progress   4.  Report > or = to 25% reduction in Rt shoulder pain with dressing and reaching overhead  Baseline:  Goal status: MET (10/12/21)    LONG TERM GOALS: Target date: 01/25/22  Be independent in advanced HEP Baseline:  Goal status: MET   2.  Improve FOTO to > or = to 68 Baseline: 69 (12/28/21) Goal status: MET  3.  Demonstrate Rt shoulder A/ROM flexion to > or = to 135 degrees to improve overhead reaching  Baseline: 140 A/ROM (12/28/21) Goal status: MET  4.  Demonstrate Rt shoulder A/ROM IR to T11 to improve dressing  Baseline: T5 (01/10/22) Goal status: MET  5.   Demonstrate Rt shoulder A/ROM ER to occiput to T5 to assist with self-care Baseline: T3 (12/28/21) Goal status: partially met   6.  Report > or = to 70% reduction  in Rt shoulder pain with use  Baseline: 75%  Goal status: MET 7. Demonstrate 4+/5 Rt shoulder strength to improve endurance Baseline: see above all met except abduction  Goal Status: partially met   PLAN: D/C PT to HEP  PHYSICAL THERAPY DISCHARGE SUMMARY  Visits from Start of Care: 13  Current functional level related to goals / functional outcomes: Pt is ready for D/C.  See above for goal and functional status.     Remaining deficits: Intermittent and much improved trigger points in Rt rhomboids and lats.  Pt has HEP in place for continued strength and flexibility gains.     Education / Equipment: HEP   Patient agrees to discharge. Patient goals were met. Patient is being discharged due to meeting the stated rehab goals.  Sigurd Sos, PT 01/10/22 1:06 PM  [facility] [address] [place], [place] [address] Phone # [phone] Fax [phone]

## 2022-02-22 NOTE — Progress Notes (Signed)
Patient Care Team: Katherina Mires, MD as PCP - General (Family Medicine) [place] Germany, RN as Oncology Nurse Navigator Tressie Ellis, Paulette Blanch, RN as Oncology Nurse Navigator Erroll Luna, MD as Consulting Physician (General Surgery) Nicholas Lose, MD as Consulting Physician (Hematology and Oncology) Gery Pray, MD as Consulting Physician (Radiation Oncology) Delice Bison Charlestine Massed, NP as Nurse Practitioner (Hematology and Oncology) Harmon Pier, RN as Registered Nurse  DIAGNOSIS:  Encounter Diagnosis  Name Primary?   Malignant neoplasm of upper-outer quadrant of right breast in female, estrogen receptor positive ([place]) Yes    SUMMARY OF ONCOLOGIC HISTORY: Oncology History  Malignant neoplasm of upper-outer quadrant of right breast in female, estrogen receptor positive ([place])  12/16/2019 Initial Diagnosis   Screening mammogram  showed a right breast asymmetry. Diagnostic mammogram showed a 2.9cm mass at the 9 o'clock position with 2 adjacent masses at the 10 and 8 o'clock positions, 0.9cm and 1.4cm respectively, and an enlarged lymph node, 1.3cm.  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 The report date is 12/31/2019.   The Multi-Cancer Panel offered by [facility] includes sequencing and/or deletion duplication testing of the following 85 genes: AIP, ALK, APC, ATM, AXIN2,BAP1,  BARD1, BLM, BMPR1A, BRCA1, BRCA2, BRIP1, CASR, CDC73, CDH1, CDK4, CDKN1B, CDKN1C, CDKN2A (p14ARF), CDKN2A (p16INK4a), CEBPA, CHEK2, CTNNA1, DICER1, DIS3L2, EGFR (c.2369C>T, p.Thr790Met variant only), EPCAM (Deletion/duplication testing only), FH, FLCN, GATA2, GPC3, GREM1 (Promoter region deletion/duplication testing only), HOXB13 (c.251G>A,  p.Gly84Glu), HRAS, KIT, MAX, MEN1, MET, MITF (c.952G>A, p.Glu318Lys variant only), MLH1, MSH2, MSH3, MSH6, MUTYH, NBN, NF1, NF2, NTHL1, PALB2, PDGFRA, PHOX2B, PMS2, POLD1, POLE, POT1, PRKAR1A, PTCH1, PTEN, RAD50, RAD51C, RAD51D, RB1, RECQL4, RET, RNF43, RUNX1, SDHAF2, SDHA (sequence changes only), SDHB, SDHC, SDHD, SMAD4, SMARCA4, SMARCB1, SMARCE1, STK11, SUFU, TERC, TERT, TMEM127, TP53, TSC1, TSC2, VHL, WRN and WT1.   01/06/2020 - 04/22/2020 Chemotherapy   Neoadjuvant chemo with TCH Perjeta x6 cycles       05/17/2020 Surgery   Right lumpectomy ([facility]): scattered foci of residual invasive lobular carcinoma, 0.6-1.0cm, grade 2, clear margins, 2 right axillary lymph nodes negative for carcinoma.   06/02/2020 - 12/29/2020 Chemotherapy   Patient is on Treatment Plan : BREAST ADO-Trastuzumab Emtansine (Kadcyla) q21d     06/24/2020 - 07/22/2020 Radiation Therapy   Adj XRT   11/17/2020 -  Anti-estrogen oral therapy   Letrozole 2.5 mg daily     CHIEF COMPLIANT: Follow-up of breast cancer on anastrozole and neratinib   INTERVAL HISTORY: Michelle Anthony is a 56 y.o. with above-mentioned history of breast cancer treated with neoadjuvant chemotherapy, lumpectomy, and Kadcyla maintenance. She presents to the clinic today for a follow-up. She reports neuropathy in feet and denies in hands. Denies stumbling and falling. A constant tingling and numbness. She states in the last 2 days she has been taking the nausea pills. She does have diarrhea 1-2 times a week. She does walk everyday to help out with the joint stiffness. She has been experiencing cramps lately mostly at night.  ALLERGIES:  has No Known Allergies.  MEDICATIONS:  Current Outpatient Medications  Medication Sig Dispense Refill   CALCIUM/MAGNESIUM/ZINC FORMULA PO Take 1,000 mg by mouth daily.     COLLAGEN PO Take by mouth daily. Pt  takes two scoops of powder in morning and night in coffee and almond milk     Cyanocobalamin (VITAMIN B-12) 6000  MCG SUBL Place under the tongue daily.     ibuprofen (ADVIL) 200 MG tablet Take 3 tablets (600 mg total) by mouth every 8 (eight) hours as needed.     loperamide (IMODIUM) 2 MG capsule Take 2 tabs (4 mg) with first loose stool, then 1 tab (2 mg) with each additional loose stool. Do not take more than 8 tabs (16 mg) in a 24-hour period. 30 capsule 2   Multiple Vitamin (MULTIVITAMIN) tablet Take 1 tablet by mouth daily.     Neratinib Maleate (NERLYNX) 40 MG tablet Take 5 tablets (200 mg total) by mouth daily. Take with food. 140 tablet 5   ondansetron (ZOFRAN) 8 MG tablet Take 1 tablet (8 mg total) by mouth every 8 (eight) hours as needed for nausea or vomiting. 30 tablet 2   prochlorperazine (COMPAZINE) 10 MG tablet Take 1 tablet (10 mg total) by mouth every 6 (six) hours as needed (Nausea or vomiting). 30 tablet 2   Tretinoin (RETIN-A EX) Apply topically.     UNABLE TO FIND Med Name: [facility]. One scoop daily     UNABLE TO FIND Med Name: Prebiothrive. One scoop daily     anastrozole (ARIMIDEX) 1 MG tablet Take 1 tablet (1 mg total) by mouth daily. 90 tablet 3   No current facility-administered medications for this visit.    PHYSICAL EXAMINATION: ECOG PERFORMANCE STATUS: 1 - Symptomatic but completely ambulatory  Vitals:   02/23/22 0816  BP: 128/72  Pulse: 70  Resp: 16  Temp: 98.1 F (36.7 C)  SpO2: 100%   Filed Weights   02/23/22 0816  Weight: 153 lb 12.8 oz (69.8 kg)     LABORATORY DATA:  I have reviewed the data as listed    Latest Ref Rng & Units 02/23/2022    7:58 AM 12/21/2021    8:46 AM 10/24/2021    8:38 AM  CMP  Glucose 70 - 99 mg/dL 87  91  93   BUN 6 - 20 mg/dL _0 Creatinine 0.44 - 1.00 mg/dL 0.80  0.82  0.79   Sodium 135 - 145 mmol/L 142  142  140   Potassium 3.5 - 5.1 mmol/L 3.6  4.0  3.6   Chloride 98 - 111 mmol/L 106  106  105   CO2 22 - 32 mmol/L _1 Calcium 8.9 - 10.3 mg/dL 9.6  10.0  9.8   Total Protein 6.5 - 8.1 g/dL 7.3  7.3  7.3    Total Bilirubin 0.3 - 1.2 mg/dL 0.5  0.6  0.6   Alkaline Phos 38 - 126 U/L 56  66  68   AST 15 - 41 U/L _2 ALT 0 - 44 U/L _3 Lab Results  Component Value Date   WBC 6.2 02/23/2022   HGB 13.4 02/23/2022   HCT 39.3 02/23/2022   MCV 92.9 02/23/2022   PLT 244 02/23/2022   NEUTROABS 4.2 02/23/2022    ASSESSMENT & PLAN:  Malignant neoplasm of upper-outer quadrant of right breast in female, estrogen receptor positive ([place]) 12/16/2019:Screening mammogram  showed a right breast asymmetry. Diagnostic mammogram showed a 2.9cm mass at the 9 o'clock position with 2 adjacent masses at the 10 and 8 o'clock positions, 0.9cm  and 1.4cm respectively, and an enlarged lymph node, 1.3cm.  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 Neoadjuvant chemotherapy with TCH Perjeta 6 cycles followed by Herceptin and Perjeta versus Kadcyla maintenance for 1 year completed 12/29/2020 2. 05/17/20: Right lumpectomy ([facility]): scattered foci of residual invasive lobular carcinoma, 0.6-1.0cm, grade 2, clear margins, 2 right axillary lymph nodes negative for carcinoma. 3. Adjuvant radiation therapy completed April 2022 4.  Adjuvant antiestrogen therapy started 11/17/2020 switched from letrozole to anastrozole 06/01/2021 5.  Followed by neratinib (started in February 2023) SWOG S 1714 neuropathy clinical trial: No adverse effects from participating in the trial ---------------------------------------------------------------------------------------------------------------------------------------------  Anastrozole toxicities:   Rt shoulder pain: Improving with dry needling Patient would like to stay on the same dosage.   Neratinib toxicities:   1.  Diarrhea: Uses Imodium 2. nausea: Uses Zofran She is currently taking 5 tablets a day, we will continue with the  same dosage.   Breast cancer surveillance: Mammogram 12/13/2021: Indeterminate architectural distortion 1 cm size right breast anterior depth lateral region.  Ultrasound did not reveal any abnormality.  Stereotactic biopsy on 12/20/2021: Benign  Because she has lobular breast cancer, we plan to do annual breast MRIs.  There are set up for March 2024.  Chemo-induced peripheral neuropathy: Stable.  Return to clinic in 2 months for follow-up    No orders of the defined types were placed in this encounter.  The patient has a good understanding of the overall plan. she agrees with it. she will call with any problems that may develop before the next visit here. Total time spent: 30 mins including face to face time and time spent for planning, charting and co-ordination of care   Harriette Ohara, MD 02/23/22    I Gardiner Coins am scribing for Dr. Lindi Adie  I have reviewed the above documentation for accuracy and completeness, and I agree with the above.

## 2022-02-23 ENCOUNTER — Ambulatory Visit: Payer: BC Managed Care – PPO | Admitting: Hematology and Oncology

## 2022-02-23 ENCOUNTER — Inpatient Hospital Stay: Payer: BC Managed Care – PPO | Admitting: Hematology and Oncology

## 2022-02-23 ENCOUNTER — Inpatient Hospital Stay: Payer: BC Managed Care – PPO | Attending: Hematology and Oncology

## 2022-02-23 ENCOUNTER — Other Ambulatory Visit: Payer: BC Managed Care – PPO

## 2022-02-23 ENCOUNTER — Encounter: Payer: Self-pay | Admitting: *Deleted

## 2022-02-23 ENCOUNTER — Other Ambulatory Visit: Payer: Self-pay

## 2022-02-23 VITALS — BP 128/72 | HR 70 | Temp 98.1°F | Resp 16 | Ht 65.0 in | Wt 153.8 lb

## 2022-02-23 DIAGNOSIS — G620 Drug-induced polyneuropathy: Secondary | ICD-10-CM | POA: Diagnosis not present

## 2022-02-23 DIAGNOSIS — R197 Diarrhea, unspecified: Secondary | ICD-10-CM | POA: Diagnosis not present

## 2022-02-23 DIAGNOSIS — C50411 Malignant neoplasm of upper-outer quadrant of right female breast: Secondary | ICD-10-CM

## 2022-02-23 DIAGNOSIS — Z79899 Other long term (current) drug therapy: Secondary | ICD-10-CM | POA: Insufficient documentation

## 2022-02-23 DIAGNOSIS — R110 Nausea: Secondary | ICD-10-CM | POA: Insufficient documentation

## 2022-02-23 DIAGNOSIS — Z923 Personal history of irradiation: Secondary | ICD-10-CM | POA: Insufficient documentation

## 2022-02-23 DIAGNOSIS — Z170 Estrogen receptor positive status [ER+]: Secondary | ICD-10-CM

## 2022-02-23 DIAGNOSIS — Z79811 Long term (current) use of aromatase inhibitors: Secondary | ICD-10-CM | POA: Diagnosis not present

## 2022-02-23 DIAGNOSIS — G629 Polyneuropathy, unspecified: Secondary | ICD-10-CM | POA: Diagnosis not present

## 2022-02-23 DIAGNOSIS — Z9221 Personal history of antineoplastic chemotherapy: Secondary | ICD-10-CM | POA: Insufficient documentation

## 2022-02-23 LAB — CBC WITH DIFFERENTIAL (CANCER CENTER ONLY)
Abs Immature Granulocytes: 0.01 10*3/uL (ref 0.00–0.07)
Basophils Absolute: 0 10*3/uL (ref 0.0–0.1)
Basophils Relative: 1 %
Eosinophils Absolute: 0.1 10*3/uL (ref 0.0–0.5)
Eosinophils Relative: 2 %
HCT: 39.3 % (ref 36.0–46.0)
Hemoglobin: 13.4 g/dL (ref 12.0–15.0)
Immature Granulocytes: 0 %
Lymphocytes Relative: 24 %
Lymphs Abs: 1.5 10*3/uL (ref 0.7–4.0)
MCH: 31.7 pg (ref 26.0–34.0)
MCHC: 34.1 g/dL (ref 30.0–36.0)
MCV: 92.9 fL (ref 80.0–100.0)
Monocytes Absolute: 0.5 10*3/uL (ref 0.1–1.0)
Monocytes Relative: 7 %
Neutro Abs: 4.2 10*3/uL (ref 1.7–7.7)
Neutrophils Relative %: 66 %
Platelet Count: 244 10*3/uL (ref 150–400)
RBC: 4.23 MIL/uL (ref 3.87–5.11)
RDW: 12.9 % (ref 11.5–15.5)
WBC Count: 6.2 10*3/uL (ref 4.0–10.5)
nRBC: 0 % (ref 0.0–0.2)

## 2022-02-23 LAB — CMP (CANCER CENTER ONLY)
ALT: 17 U/L (ref 0–44)
AST: 19 U/L (ref 15–41)
Albumin: 4.4 g/dL (ref 3.5–5.0)
Alkaline Phosphatase: 56 U/L (ref 38–126)
Anion gap: 7 (ref 5–15)
BUN: 20 mg/dL (ref 6–20)
CO2: 29 mmol/L (ref 22–32)
Calcium: 9.6 mg/dL (ref 8.9–10.3)
Chloride: 106 mmol/L (ref 98–111)
Creatinine: 0.8 mg/dL (ref 0.44–1.00)
GFR, Estimated: >60 mL/min (ref >60)
Glucose, Bld: 87 mg/dL (ref 70–99)
Potassium: 3.6 mmol/L (ref 3.5–5.1)
Sodium: 142 mmol/L (ref 135–145)
Total Bilirubin: 0.5 mg/dL (ref 0.3–1.2)
Total Protein: 7.3 g/dL (ref 6.5–8.1)

## 2022-02-23 MED ORDER — ANASTROZOLE 1 MG PO TABS: 1.0000 mg | ORAL_TABLET | Freq: Every day | ORAL | 3 refills | Status: DC

## 2022-02-23 NOTE — Assessment & Plan Note (Addendum)
12/16/2019:Screening mammogram  showed a right breast asymmetry. Diagnostic mammogram showed a 2.9cm mass at the 9 o'clock position with 2 adjacent masses at the 10 and 8 o'clock positions, 0.9cm and 1.4cm respectively, and an enlarged lymph node, 1.3cm.  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 Neoadjuvant chemotherapy with TCH Perjeta 6 cycles followed by Herceptin and Perjeta versus Kadcyla maintenance for 1 year completed 12/29/2020 2. 05/17/20: Right lumpectomy ([facility]): scattered foci of residual invasive lobular carcinoma, 0.6-1.0cm, grade 2, clear margins, 2 right axillary lymph nodes negative for carcinoma. 3. Adjuvant radiation therapy completed April 2022 4.  Adjuvant antiestrogen therapy started 11/17/2020 switched from letrozole to anastrozole 06/01/2021 5.  Followed by neratinib (started in February 2023) SWOG S 1714 neuropathy clinical trial: No adverse effects from participating in the trial ---------------------------------------------------------------------------------------------------------------------------------------------  Anastrozole toxicities:   Rt shoulder pain: Improving with dry needling Patient would like to stay on the same dosage.   Neratinib toxicities:   1.  Diarrhea: Uses Imodium 2. nausea: Uses Zofran She is currently taking 5 tablets a day, we will continue with the same dosage.   Breast cancer surveillance: Mammogram 12/13/2021: Indeterminate architectural distortion 1 cm size right breast anterior depth lateral region.  Ultrasound did not reveal any abnormality.  Stereotactic biopsy on 12/20/2021: Benign  Because she has lobular breast cancer, we plan to do annual breast MRIs.  There are set up for March 2024.  Chemo-induced peripheral neuropathy: Stable.  Return to clinic in 2 months for follow-up

## 2022-02-23 NOTE — Research (Signed)
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  PROs: Questionnaires were provided to the patient after arrival to the [facility]. Collected questionnaires and checked for completeness and accuracy.  Physician Assessments: CTCAE forms completed and signed by Dr. Lindi Adie. Patient reports that her neuropathy in her feet is about the same since last year. She stopped taking Gabapentin because she didn't think it was helping. Patient is taking vitamin B12 and wearing comfortable shoes to help alleviate the discomfort.  Treatment Update: Patient completed Kadcyla 11 cycles on 12/29/20.   Neratinib started 07/27/2021 and is ongoing.  Plan: Informed patient of week 156 visit due in one year in September 2024. This visit can be completed by phone or in person.  Research will contact patient to complete visit when if it due or meet patient in clinic if she has appointment scheduled during the visit window. Patient verbalized understanding and denied having any questions at this time.  Patient was thanked for her time and contribution to study and was encouraged to call clinic for any questions or concerns. Foye Spurling, BSN, RN, [place] Nurse II [phone] 02/23/2022 8:41 AM

## 2022-02-23 NOTE — Research (Signed)
ACCRU-[place]-2102 - TREATMENT OF ESTABLISHED CHEMOTHERAPY-INDUCED NEUROPATHY WITH N-PALMITOYLETHANOLAMIDE, A CANNABIMIMETIC NUTRACEUTICAL: A RANDOMIZED DOUBLE-BLIND PHASE II PILOT TRIAL  Patient had been previously introduced to the above study but was ineligible due to ongoing treatment with Kadcyla at the time.  Patient reports her neuropathy today is unchanged from previous visit. She rates the severity of numbness and tingling in her feet as moderate.  Patient stopped taking gabapentin because she didn't feel it was helping.  Dr. Lindi Adie discussed the above trial with patient and she is willing to consider it.  Patient agreed to research nurse e-mailing her copy of the ICF for review.  ICF emailed to patient's work email per her request at [email or web address].   Research contact information provided and encouraged patient to reply with any questions.  Foye Spurling, BSN, RN, [place] Nurse II [phone] 02/23/2022

## 2022-02-26 ENCOUNTER — Telehealth: Payer: Self-pay | Admitting: Hematology and Oncology

## 2022-02-26 ENCOUNTER — Ambulatory Visit: Payer: BC Managed Care – PPO | Attending: Hematology and Oncology

## 2022-02-26 VITALS — Wt 153.2 lb

## 2022-02-26 DIAGNOSIS — Z483 Aftercare following surgery for neoplasm: Secondary | ICD-10-CM | POA: Insufficient documentation

## 2022-02-26 NOTE — Therapy (Signed)
OUTPATIENT PHYSICAL THERAPY SOZO SCREENING NOTE   Patient Name: Michelle Anthony MRN: [id number] DOB:07-Apr-1966, 56 y.o., female Today's Date: 02/26/2022  PCP: Katherina Mires, MD REFERRING PROVIDER: Nicholas Lose, MD   PT End of Session - 02/26/22 [phone]     Visit Number 13   # unchanged due to screen only   PT Start Time 0759    PT Stop Time 0810    PT Time Calculation (min) 11 min    Activity Tolerance Patient tolerated treatment well    Behavior During Therapy [facility] for tasks assessed/performed             Past Medical History:  Diagnosis Date   Erythema nodosum    Family history of breast cancer    Family history of melanoma    Frequent UTI    Frozen shoulder    History of radiation therapy 06/23/20-07/22/20   IMRT to Right Breast, Dr. Gery Pray    STD (sexually transmitted disease)    H/O HSV II   Past Surgical History:  Procedure Laterality Date   BOTOX INJECTION     BREAST LUMPECTOMY WITH RADIOACTIVE SEED AND SENTINEL LYMPH NODE BIOPSY Right 05/17/2020   Procedure: RIGHT BREAST LUMPECTOMY WITH RADIOACTIVE SEED x5 AND SENTINEL LYMPH NODE MAPPING;  Surgeon: Erroll Luna, MD;  Location: [place];  Service: General;  Laterality: Right;   CLOSED MANIPULATION SHOULDER     PORTACATH PLACEMENT Right 01/05/2020   Procedure: INSERTION PORT-A-CATH WITH ULTRASOUND GUIDANCE;  Surgeon: Erroll Luna, MD;  Location: [place];  Service: General;  Laterality: Right;   Patient Active Problem List   Diagnosis Date Noted   Family history of malignant neoplasm of gastrointestinal tract 08/30/2020   Colon cancer screening 08/30/2020   Port-A-Cath in place 01/13/2020   Genetic testing 01/01/2020   Family history of breast cancer    Family history of melanoma    Malignant neoplasm of upper-outer quadrant of right breast in female, estrogen receptor positive ([place]) 12/16/2019   Abnormal mammogram 12/16/2019   Frozen shoulder 12/16/2018    Arthritis of carpometacarpal ([place]) joint of thumb 11/01/2017   Fatigue 11/01/2017    REFERRING DIAG: right breast cancer at risk for lymphedema  THERAPY DIAG: Aftercare following surgery for neoplasm  PERTINENT HISTORY: Patient was diagnosed on 12/02/2019 with 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 It measures 2.9 cm and is located in the upper outer quadrant. It is ER/PR positive and HER2 negative with a Ki67 of 20%  PRECAUTIONS: right UE Lymphedema risk, None  SUBJECTIVE: Pt returns for her 3 month L-Dex screen.   PAIN:  Are you having pain? No  SOZO SCREENING: Patient was assessed today using the SOZO machine to determine the lymphedema index score. This was compared to her baseline score. It was determined that she is within the recommended range when compared to her baseline and no further action is needed at this time. She will continue SOZO screenings. These are done every 3 months for 2 years post operatively followed by every 6 months for 2 years, and then annually.  Also answered pts questions about wearing a compression sleeve when flying and insued information about where to get measured ([facility]) and when to wear it.    L-DEX FLOWSHEETS - 02/26/22 0800       L-DEX LYMPHEDEMA SCREENING   Measurement Type Unilateral    L-DEX MEASUREMENT EXTREMITY Upper Extremity    POSITION  Standing  DOMINANT SIDE Right    At Risk Side Right    BASELINE SCORE (UNILATERAL) -1.7    L-DEX SCORE (UNILATERAL) -0.1    VALUE CHANGE (UNILAT) 1.6              [name], PTA 02/26/2022, 8:12 AM

## 2022-02-26 NOTE — Telephone Encounter (Signed)
Scheduled appointment per 11/10 los. Left voicemail.

## 2022-03-22 ENCOUNTER — Telehealth: Payer: Self-pay | Admitting: *Deleted

## 2022-03-22 NOTE — Telephone Encounter (Signed)
ACCRU-[place]-2102 - TREATMENT OF ESTABLISHED CHEMOTHERAPY-INDUCED NEUROPATHY WITH N-PALMITOYLETHANOLAMIDE, A CANNABIMIMETIC NUTRACEUTICAL: A RANDOMIZED DOUBLE-BLIND PHASE II PILOT TRIAL  LVM for patient to follow up on the above study to see if she has any questions and/or if she is interested in participating.  Foye Spurling, BSN, RN, [place] Nurse II [phone] 03/22/2022 3:22 PM

## 2022-03-29 ENCOUNTER — Other Ambulatory Visit: Payer: Self-pay | Admitting: *Deleted

## 2022-03-29 ENCOUNTER — Encounter: Payer: Self-pay | Admitting: *Deleted

## 2022-03-29 MED ORDER — ONDANSETRON HCL 8 MG PO TABS: 8.0000 mg | ORAL_TABLET | Freq: Three times a day (TID) | ORAL | 2 refills | Status: DC | PRN

## 2022-03-29 NOTE — Progress Notes (Signed)
Received call from pt requesting Flexeril 10 mg p.o tablet at bedtime and Naproxen 550 mg p.o BID be added to her med list.  Pt states she has a slipped disc in her jaw which has been causing pain.  Medications added.

## 2022-05-07 ENCOUNTER — Other Ambulatory Visit: Payer: Self-pay | Admitting: Hematology and Oncology

## 2022-05-07 DIAGNOSIS — C50411 Malignant neoplasm of upper-outer quadrant of right female breast: Secondary | ICD-10-CM

## 2022-05-28 ENCOUNTER — Ambulatory Visit: Payer: BC Managed Care – PPO

## 2022-05-28 ENCOUNTER — Ambulatory Visit: Payer: BC Managed Care – PPO | Admitting: Pharmacist

## 2022-05-28 ENCOUNTER — Inpatient Hospital Stay: Payer: BC Managed Care – PPO | Admitting: Hematology and Oncology

## 2022-05-28 ENCOUNTER — Inpatient Hospital Stay: Payer: BC Managed Care – PPO

## 2022-06-04 ENCOUNTER — Ambulatory Visit: Payer: BC Managed Care – PPO | Attending: Hematology and Oncology | Admitting: Rehabilitation

## 2022-06-04 ENCOUNTER — Encounter: Payer: Self-pay | Admitting: Rehabilitation

## 2022-06-04 DIAGNOSIS — Z170 Estrogen receptor positive status [ER+]: Secondary | ICD-10-CM | POA: Insufficient documentation

## 2022-06-04 DIAGNOSIS — Z483 Aftercare following surgery for neoplasm: Secondary | ICD-10-CM | POA: Insufficient documentation

## 2022-06-04 DIAGNOSIS — C50411 Malignant neoplasm of upper-outer quadrant of right female breast: Secondary | ICD-10-CM | POA: Insufficient documentation

## 2022-06-04 NOTE — Assessment & Plan Note (Signed)
12/16/2019:Screening mammogram  showed a right breast asymmetry. Diagnostic mammogram showed a 2.9cm mass at the 9 o'clock position with 2 adjacent masses at the 10 and 8 o'clock positions, 0.9cm and 1.4cm respectively, and an enlarged lymph node, 1.3cm.  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 Neoadjuvant chemotherapy with TCH Perjeta 6 cycles followed by Herceptin and Perjeta versus Kadcyla maintenance for 1 year completed 12/29/2020 2. 05/17/20: Right lumpectomy ([facility]): scattered foci of residual invasive lobular carcinoma, 0.6-1.0cm, grade 2, clear margins, 2 right axillary lymph nodes negative for carcinoma. 3. Adjuvant radiation therapy completed April 2022 4.  Adjuvant antiestrogen therapy started 11/17/2020 switched from letrozole to anastrozole 06/01/2021 5.  Followed by neratinib (started in February 2023) SWOG S 1714 neuropathy clinical trial: No adverse effects from participating in the trial ---------------------------------------------------------------------------------------------------------------------------------------------  Anastrozole toxicities:   Rt shoulder pain: Improving with dry needling Patient would like to stay on the same dosage.   Neratinib toxicities:   1.  Diarrhea: Uses Imodium 2. nausea: Uses Zofran She is currently taking 5 tablets a day, we will continue with the same dosage.   Breast cancer surveillance: Mammogram 12/13/2021: Indeterminate architectural distortion 1 cm size right breast anterior depth lateral region.  Ultrasound did not reveal any abnormality.  Stereotactic biopsy on 12/20/2021: Benign  Because she has lobular breast cancer, we plan to do annual breast MRIs.  There are set up for March 2024.  Chemo-induced peripheral neuropathy: Stable.  Return to clinic in 2 months for follow-up 

## 2022-06-04 NOTE — Therapy (Signed)
  OUTPATIENT PHYSICAL THERAPY SOZO SCREENING NOTE   Patient Name: Michelle Anthony MRN: [id number] DOB:11/25/1965, 57 y.o., female Today's Date: 06/04/2022  PCP: Katherina Mires, MD REFERRING PROVIDER: Nicholas Lose, MD     Past Medical History:  Diagnosis Date   Erythema nodosum    Family history of breast cancer    Family history of melanoma    Frequent UTI    Frozen shoulder    History of radiation therapy 06/23/20-07/22/20   IMRT to Right Breast, Dr. Gery Pray    STD (sexually transmitted disease)    H/O HSV II   Past Surgical History:  Procedure Laterality Date   BOTOX INJECTION     BREAST LUMPECTOMY WITH RADIOACTIVE SEED AND SENTINEL LYMPH NODE BIOPSY Right 05/17/2020   Procedure: RIGHT BREAST LUMPECTOMY WITH RADIOACTIVE SEED x5 AND SENTINEL LYMPH NODE MAPPING;  Surgeon: Erroll Luna, MD;  Location: [place];  Service: General;  Laterality: Right;   CLOSED MANIPULATION SHOULDER     PORTACATH PLACEMENT Right 01/05/2020   Procedure: INSERTION PORT-A-CATH WITH ULTRASOUND GUIDANCE;  Surgeon: Erroll Luna, MD;  Location: [place];  Service: General;  Laterality: Right;   Patient Active Problem List   Diagnosis Date Noted   Family history of malignant neoplasm of gastrointestinal tract 08/30/2020   Colon cancer screening 08/30/2020   Port-A-Cath in place 01/13/2020   Genetic testing 01/01/2020   Family history of breast cancer    Family history of melanoma    Malignant neoplasm of upper-outer quadrant of right breast in female, estrogen receptor positive ([place]) 12/16/2019   Abnormal mammogram 12/16/2019   Frozen shoulder 12/16/2018   Arthritis of carpometacarpal ([place]) joint of thumb 11/01/2017   Fatigue 11/01/2017    REFERRING DIAG: right breast cancer at risk for lymphedema  THERAPY DIAG: Aftercare following surgery for neoplasm  Malignant neoplasm of upper-outer quadrant of right breast in female, estrogen receptor positive  ([place])  PERTINENT HISTORY: Patient was diagnosed on 12/02/2019 with 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 It measures 2.9 cm and is located in the upper outer quadrant. It is ER/PR positive and HER2 negative with a Ki67 of 20%  PRECAUTIONS: right UE Lymphedema risk, None  SUBJECTIVE: Pt returns for her 3 month L-Dex screen.   PAIN:  Are you having pain? No  SOZO SCREENING: Patient was assessed today using the SOZO machine to determine the lymphedema index score. This was compared to her baseline score. It was determined that she is within the recommended range when compared to her baseline and no further action is needed at this time. She will continue SOZO screenings. These are done every 3 months for 2 years post operatively followed by every 6 months for 2 years, and then annually.  Also answered pts questions about wearing a compression sleeve when flying and insued information about where to get measured ([facility]) and when to wear it.    L-DEX FLOWSHEETS - 06/04/22 1300       L-DEX LYMPHEDEMA SCREENING   Measurement Type Unilateral    L-DEX MEASUREMENT EXTREMITY Upper Extremity    POSITION  Standing    DOMINANT SIDE Right    At Risk Side Right    BASELINE SCORE (UNILATERAL) -1.7    L-DEX SCORE (UNILATERAL) -0.9    VALUE CHANGE (UNILAT) 0.8              [name], Adrian Prince, PT 06/04/2022, 1:06 PM

## 2022-06-04 NOTE — Progress Notes (Signed)
Patient Care Team: Katherina Mires, MD as PCP - General (Family Medicine) Rockwell Germany, RN as Oncology Nurse Navigator Tressie Ellis, Paulette Blanch, RN as Oncology Nurse Navigator Erroll Luna, MD as Consulting Physician (General Surgery) Nicholas Lose, MD as Consulting Physician (Hematology and Oncology) Gery Pray, MD as Consulting Physician (Radiation Oncology) Delice Bison Charlestine Massed, NP as Nurse Practitioner (Hematology and Oncology) Harmon Pier, RN as Registered Nurse  DIAGNOSIS: No diagnosis found.  SUMMARY OF ONCOLOGIC HISTORY: Oncology History  Malignant neoplasm of upper-outer quadrant of right breast in female, estrogen receptor positive ([place])  12/16/2019 Initial Diagnosis   Screening mammogram  showed a right breast asymmetry. Diagnostic mammogram showed a 2.9cm mass at the 9 o'clock position with 2 adjacent masses at the 10 and 8 o'clock positions, 0.9cm and 1.4cm respectively, and an enlarged lymph node, 1.3cm.  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 The report date is 12/31/2019.   The Multi-Cancer Panel offered by [facility] includes sequencing and/or deletion duplication testing of the following 85 genes: AIP, ALK, APC, ATM, AXIN2,BAP1,  BARD1, BLM, BMPR1A, BRCA1, BRCA2, BRIP1, CASR, CDC73, CDH1, CDK4, CDKN1B, CDKN1C, CDKN2A (p14ARF), CDKN2A (p16INK4a), CEBPA, CHEK2, CTNNA1, DICER1, DIS3L2, EGFR (c.2369C>T, p.Thr790Met variant only), EPCAM (Deletion/duplication testing only), FH, FLCN, GATA2, GPC3, GREM1 (Promoter region deletion/duplication testing only), HOXB13 (c.251G>A, p.Gly84Glu), HRAS, KIT, MAX, MEN1, MET, MITF (c.952G>A, p.Glu318Lys variant only), MLH1, MSH2, MSH3, MSH6, MUTYH, NBN, NF1, NF2, NTHL1,  PALB2, PDGFRA, PHOX2B, PMS2, POLD1, POLE, POT1, PRKAR1A, PTCH1, PTEN, RAD50, RAD51C, RAD51D, RB1, RECQL4, RET, RNF43, RUNX1, SDHAF2, SDHA (sequence changes only), SDHB, SDHC, SDHD, SMAD4, SMARCA4, SMARCB1, SMARCE1, STK11, SUFU, TERC, TERT, TMEM127, TP53, TSC1, TSC2, VHL, WRN and WT1.   01/06/2020 - 04/22/2020 Chemotherapy   Neoadjuvant chemo with TCH Perjeta x6 cycles       05/17/2020 Surgery   Right lumpectomy ([facility]): scattered foci of residual invasive lobular carcinoma, 0.6-1.0cm, grade 2, clear margins, 2 right axillary lymph nodes negative for carcinoma.   06/02/2020 - 12/29/2020 Chemotherapy   Patient is on Treatment Plan : BREAST ADO-Trastuzumab Emtansine (Kadcyla) q21d     06/24/2020 - 07/22/2020 Radiation Therapy   Adj XRT   11/17/2020 -  Anti-estrogen oral therapy   Letrozole 2.5 mg daily     CHIEF COMPLIANT:   INTERVAL HISTORY: Michelle Anthony is a   ALLERGIES:  has No Known Allergies.  MEDICATIONS:  Current Outpatient Medications  Medication Sig Dispense Refill   anastrozole (ARIMIDEX) 1 MG tablet Take 1 tablet (1 mg total) by mouth daily. 90 tablet 3   CALCIUM/MAGNESIUM/ZINC FORMULA PO Take 1,000 mg by mouth daily.     COLLAGEN PO Take by mouth daily. Pt takes two scoops of powder in morning and night in coffee and almond milk     Cyanocobalamin (VITAMIN B-12) 6000 MCG SUBL Place under the tongue daily.     ibuprofen (ADVIL) 200 MG tablet Take 3 tablets (600 mg total) by mouth every 8 (eight) hours as needed.     loperamide (IMODIUM) 2 MG capsule Take 2 tabs (4 mg) with first loose stool, then 1 tab (2 mg) with each additional loose stool. Do not take more than 8 tabs (16 mg) in a 24-hour period. 30 capsule  2   Multiple Vitamin (MULTIVITAMIN) tablet Take 1 tablet by mouth daily.     naproxen sodium (ANAPROX) 550 MG tablet Take 550 mg by mouth 2 (two) times daily with a meal.     NERLYNX 40 MG tablet TAKE 5 TABLETS DAILY. TAKE WITH FOOD. (TOTAL DOSE 200 MG) 140 tablet 5    ondansetron (ZOFRAN) 8 MG tablet Take 1 tablet (8 mg total) by mouth every 8 (eight) hours as needed for nausea or vomiting. 30 tablet 2   prochlorperazine (COMPAZINE) 10 MG tablet Take 1 tablet (10 mg total) by mouth every 6 (six) hours as needed (Nausea or vomiting). 30 tablet 2   Tretinoin (RETIN-A EX) Apply topically.     UNABLE TO FIND Med Name: [facility]. One scoop daily     UNABLE TO FIND Med Name: Prebiothrive. One scoop daily     No current facility-administered medications for this visit.    PHYSICAL EXAMINATION: ECOG PERFORMANCE STATUS: {CHL ONC ECOG PS:[phone]}  There were no vitals filed for this visit. There were no vitals filed for this visit.  BREAST:*** No palpable masses or nodules in either right or left breasts. No palpable axillary supraclavicular or infraclavicular adenopathy no breast tenderness or nipple discharge. (exam performed in the presence of a chaperone)  LABORATORY DATA:  I have reviewed the data as listed    Latest Ref Rng & Units 02/23/2022    7:58 AM 12/21/2021    8:46 AM 10/24/2021    8:38 AM  CMP  Glucose 70 - 99 mg/dL 87  91  93   BUN 6 - 20 mg/dL 20  21  17   $ Creatinine 0.44 - 1.00 mg/dL 0.80  0.82  0.79   Sodium 135 - 145 mmol/L 142  142  140   Potassium 3.5 - 5.1 mmol/L 3.6  4.0  3.6   Chloride 98 - 111 mmol/L 106  106  105   CO2 22 - 32 mmol/L 29  30  28   $ Calcium 8.9 - 10.3 mg/dL 9.6  10.0  9.8   Total Protein 6.5 - 8.1 g/dL 7.3  7.3  7.3   Total Bilirubin 0.3 - 1.2 mg/dL 0.5  0.6  0.6   Alkaline Phos 38 - 126 U/L 56  66  68   AST 15 - 41 U/L 19  21  25   $ ALT 0 - 44 U/L 17  23  28     $ Lab Results  Component Value Date   WBC 6.2 02/23/2022   HGB 13.4 02/23/2022   HCT 39.3 02/23/2022   MCV 92.9 02/23/2022   PLT 244 02/23/2022   NEUTROABS 4.2 02/23/2022    ASSESSMENT & PLAN:  No problem-specific Assessment & Plan notes found for this encounter.    No orders of the defined types were placed in this encounter.  The  patient has a good understanding of the overall plan. she agrees with it. she will call with any problems that may develop before the next visit here. Total time spent: 30 mins including face to face time and time spent for planning, charting and co-ordination of care   Suzzette Righter, [place] 06/04/22    I Gardiner Coins am acting as a Education administrator for [employer]  ***

## 2022-06-05 ENCOUNTER — Inpatient Hospital Stay: Payer: BC Managed Care – PPO | Attending: Hematology and Oncology

## 2022-06-05 ENCOUNTER — Inpatient Hospital Stay: Payer: BC Managed Care – PPO | Admitting: Hematology and Oncology

## 2022-06-05 ENCOUNTER — Other Ambulatory Visit: Payer: Self-pay

## 2022-06-05 VITALS — BP 120/82 | HR 70 | Temp 97.7°F | Resp 17 | Wt 151.0 lb

## 2022-06-05 DIAGNOSIS — M25511 Pain in right shoulder: Secondary | ICD-10-CM | POA: Insufficient documentation

## 2022-06-05 DIAGNOSIS — R197 Diarrhea, unspecified: Secondary | ICD-10-CM | POA: Insufficient documentation

## 2022-06-05 DIAGNOSIS — C50411 Malignant neoplasm of upper-outer quadrant of right female breast: Secondary | ICD-10-CM

## 2022-06-05 DIAGNOSIS — Z170 Estrogen receptor positive status [ER+]: Secondary | ICD-10-CM

## 2022-06-05 DIAGNOSIS — Z79811 Long term (current) use of aromatase inhibitors: Secondary | ICD-10-CM | POA: Diagnosis not present

## 2022-06-05 DIAGNOSIS — G629 Polyneuropathy, unspecified: Secondary | ICD-10-CM | POA: Insufficient documentation

## 2022-06-05 LAB — CBC WITH DIFFERENTIAL (CANCER CENTER ONLY)
Abs Immature Granulocytes: 0.01 10*3/uL (ref 0.00–0.07)
Basophils Absolute: 0 10*3/uL (ref 0.0–0.1)
Basophils Relative: 1 %
Eosinophils Absolute: 0.1 10*3/uL (ref 0.0–0.5)
Eosinophils Relative: 2 %
HCT: 38.3 % (ref 36.0–46.0)
Hemoglobin: 13 g/dL (ref 12.0–15.0)
Immature Granulocytes: 0 %
Lymphocytes Relative: 36 %
Lymphs Abs: 1.5 10*3/uL (ref 0.7–4.0)
MCH: 31.2 pg (ref 26.0–34.0)
MCHC: 33.9 g/dL (ref 30.0–36.0)
MCV: 91.8 fL (ref 80.0–100.0)
Monocytes Absolute: 0.4 10*3/uL (ref 0.1–1.0)
Monocytes Relative: 9 %
Neutro Abs: 2.3 10*3/uL (ref 1.7–7.7)
Neutrophils Relative %: 52 %
Platelet Count: 244 10*3/uL (ref 150–400)
RBC: 4.17 MIL/uL (ref 3.87–5.11)
RDW: 13.1 % (ref 11.5–15.5)
WBC Count: 4.3 10*3/uL (ref 4.0–10.5)
nRBC: 0 % (ref 0.0–0.2)

## 2022-06-05 LAB — CMP (CANCER CENTER ONLY)
ALT: 16 U/L (ref 0–44)
AST: 16 U/L (ref 15–41)
Albumin: 4.3 g/dL (ref 3.5–5.0)
Alkaline Phosphatase: 60 U/L (ref 38–126)
Anion gap: 6 (ref 5–15)
BUN: 19 mg/dL (ref 6–20)
CO2: 29 mmol/L (ref 22–32)
Calcium: 9.4 mg/dL (ref 8.9–10.3)
Chloride: 105 mmol/L (ref 98–111)
Creatinine: 0.75 mg/dL (ref 0.44–1.00)
GFR, Estimated: >60 mL/min (ref >60)
Glucose, Bld: 89 mg/dL (ref 70–99)
Potassium: 3.8 mmol/L (ref 3.5–5.1)
Sodium: 140 mmol/L (ref 135–145)
Total Bilirubin: 0.4 mg/dL (ref 0.3–1.2)
Total Protein: 6.7 g/dL (ref 6.5–8.1)

## 2022-06-06 ENCOUNTER — Telehealth: Payer: Self-pay | Admitting: Hematology and Oncology

## 2022-06-06 NOTE — Telephone Encounter (Signed)
Scheduled appointment per 2/20 los. Patient is aware of the made appointments.

## 2022-06-20 ENCOUNTER — Encounter: Payer: Self-pay | Admitting: Hematology and Oncology

## 2022-06-29 ENCOUNTER — Other Ambulatory Visit: Payer: BC Managed Care – PPO

## 2022-07-03 ENCOUNTER — Ambulatory Visit
Admission: RE | Admit: 2022-07-03 | Discharge: 2022-07-03 | Disposition: A | Discharge status: Home / Self Care | Payer: BC Managed Care – PPO | Source: Ambulatory Visit | Attending: Hematology and Oncology | Admitting: Hematology and Oncology

## 2022-07-03 ENCOUNTER — Other Ambulatory Visit: Payer: Self-pay | Admitting: Hematology and Oncology

## 2022-07-03 DIAGNOSIS — Z170 Estrogen receptor positive status [ER+]: Secondary | ICD-10-CM

## 2022-07-03 DIAGNOSIS — R9389 Abnormal findings on diagnostic imaging of other specified body structures: Secondary | ICD-10-CM

## 2022-07-03 MED ORDER — GADOPICLENOL 0.5 MMOL/ML IV SOLN
7.0000 mL | Freq: Once | INTRAVENOUS | Status: AC | PRN
  Administered 2022-07-03: 7 mL via INTRAVENOUS

## 2022-07-04 ENCOUNTER — Other Ambulatory Visit: Payer: Self-pay

## 2022-07-09 ENCOUNTER — Ambulatory Visit
Admission: RE | Admit: 2022-07-09 | Discharge: 2022-07-09 | Disposition: A | Discharge status: Home / Self Care | Payer: BC Managed Care – PPO | Source: Ambulatory Visit | Attending: Hematology and Oncology | Admitting: Hematology and Oncology

## 2022-07-09 DIAGNOSIS — R9389 Abnormal findings on diagnostic imaging of other specified body structures: Secondary | ICD-10-CM

## 2022-07-09 HISTORY — PX: BREAST BIOPSY: SHX20

## 2022-07-09 MED ORDER — GADOPICLENOL 0.5 MMOL/ML IV SOLN
7.0000 mL | Freq: Once | INTRAVENOUS | Status: AC | PRN
  Administered 2022-07-09: 7 mL via INTRAVENOUS

## 2022-07-26 ENCOUNTER — Ambulatory Visit: Payer: BC Managed Care – PPO

## 2022-09-03 ENCOUNTER — Ambulatory Visit: Payer: BC Managed Care – PPO

## 2022-12-10 ENCOUNTER — Telehealth: Payer: Self-pay

## 2022-12-10 DIAGNOSIS — C50411 Malignant neoplasm of upper-outer quadrant of right female breast: Secondary | ICD-10-CM

## 2022-12-10 NOTE — Telephone Encounter (Signed)
Z6109 - A Prospective Observational Cohort Study to Develop a Predictive Model of Taxane-Induced Peripheral Neuropathy in Cancer Patients    Called the patient to complete their 156 week follow up for the s1714 study. The patient didn't answer and I left a message with my contact information. Will try and contact the patient tomorrow.   Felecia Jan, [facility] 12/10/2022 2:51 PM

## 2022-12-14 ENCOUNTER — Telehealth: Payer: Self-pay

## 2022-12-14 NOTE — Telephone Encounter (Signed)
Patient called regarding a mammogram appointment made with [facility] on 12/18/2022. There was confusion regarding if an MRI or a mammogram was needed based on pt's last MRI result done March 2024.  This RN relayed a message to Dr. Pamelia Hoit and let patient know that we would follow-up with Dr. Pamelia Hoit on which imaging she needs upon his return to the clinic 12/18/2022.

## 2022-12-19 ENCOUNTER — Other Ambulatory Visit: Payer: Self-pay | Admitting: *Deleted

## 2022-12-19 DIAGNOSIS — Z170 Estrogen receptor positive status [ER+]: Secondary | ICD-10-CM

## 2023-01-09 ENCOUNTER — Encounter: Payer: Self-pay | Admitting: Hematology and Oncology

## 2023-01-09 ENCOUNTER — Telehealth: Payer: Self-pay | Admitting: *Deleted

## 2023-01-09 NOTE — Telephone Encounter (Signed)
Per MD request, RN placed call to pt to review recent bone density report showing T score -1.0.  pt educated on Vitamin D 25, Calcium 1,200 mg, and weight bearing exercises daily.  Pt verbalized understanding.

## 2023-01-10 ENCOUNTER — Encounter: Payer: Self-pay | Admitting: Hematology and Oncology

## 2023-01-30 ENCOUNTER — Encounter: Payer: Self-pay | Admitting: Hematology and Oncology

## 2023-01-30 NOTE — Telephone Encounter (Signed)
TC

## 2023-02-25 ENCOUNTER — Telehealth: Payer: Self-pay

## 2023-02-25 NOTE — Telephone Encounter (Signed)
Z6109 - A Prospective Observational Cohort Study to Develop a Predictive Model of Taxane-Induced Peripheral Neuropathy in Cancer Patients   Research nurse attempted to contact patient today for her 156 week follow up assessment without success. Message left for her to return call at her earliest convenience on secure line.  Chriss Driver, RN 02/25/23 3:45 PM

## 2023-03-04 NOTE — Telephone Encounter (Signed)
Research nurse called patient today for follow up on the S1714 Peripheral Neuropathy study. Message left for patient to return call at her convenience to complete the one year follow up required by the study.  Chriss Driver, RN 03/04/23 3:38 PM

## 2023-03-08 ENCOUNTER — Other Ambulatory Visit: Payer: Self-pay | Admitting: Hematology and Oncology

## 2023-06-05 ENCOUNTER — Telehealth: Payer: Self-pay | Admitting: Hematology and Oncology

## 2023-06-05 NOTE — Telephone Encounter (Signed)
Patient called to reschedule appointments. Appointments are rescheduled.

## 2023-06-06 ENCOUNTER — Inpatient Hospital Stay: Payer: BC Managed Care – PPO

## 2023-06-06 ENCOUNTER — Inpatient Hospital Stay: Payer: BC Managed Care – PPO | Admitting: Hematology and Oncology

## 2023-06-19 ENCOUNTER — Other Ambulatory Visit: Payer: Self-pay

## 2023-06-19 ENCOUNTER — Other Ambulatory Visit: Payer: Self-pay | Admitting: *Deleted

## 2023-06-19 DIAGNOSIS — C50411 Malignant neoplasm of upper-outer quadrant of right female breast: Secondary | ICD-10-CM

## 2023-06-20 ENCOUNTER — Inpatient Hospital Stay: Payer: BC Managed Care – PPO | Admitting: Hematology and Oncology

## 2023-06-20 ENCOUNTER — Inpatient Hospital Stay: Payer: BC Managed Care – PPO | Attending: Hematology and Oncology

## 2023-06-20 DIAGNOSIS — Z1721 Progesterone receptor positive status: Secondary | ICD-10-CM | POA: Diagnosis not present

## 2023-06-20 DIAGNOSIS — C50411 Malignant neoplasm of upper-outer quadrant of right female breast: Secondary | ICD-10-CM | POA: Insufficient documentation

## 2023-06-20 DIAGNOSIS — Z170 Estrogen receptor positive status [ER+]: Secondary | ICD-10-CM

## 2023-06-20 DIAGNOSIS — Z79811 Long term (current) use of aromatase inhibitors: Secondary | ICD-10-CM | POA: Diagnosis not present

## 2023-06-20 DIAGNOSIS — G620 Drug-induced polyneuropathy: Secondary | ICD-10-CM | POA: Diagnosis not present

## 2023-06-20 DIAGNOSIS — Z1731 Human epidermal growth factor receptor 2 positive status: Secondary | ICD-10-CM | POA: Insufficient documentation

## 2023-06-20 DIAGNOSIS — Z9221 Personal history of antineoplastic chemotherapy: Secondary | ICD-10-CM | POA: Diagnosis not present

## 2023-06-20 DIAGNOSIS — Z923 Personal history of irradiation: Secondary | ICD-10-CM | POA: Insufficient documentation

## 2023-06-20 LAB — CBC WITH DIFFERENTIAL (CANCER CENTER ONLY)
Abs Immature Granulocytes: 0.01 10*3/uL (ref 0.00–0.07)
Basophils Absolute: 0.1 10*3/uL (ref 0.0–0.1)
Basophils Relative: 1 %
Eosinophils Absolute: 0.1 10*3/uL (ref 0.0–0.5)
Eosinophils Relative: 2 %
HCT: 41 % (ref 36.0–46.0)
Hemoglobin: 13.9 g/dL (ref 12.0–15.0)
Immature Granulocytes: 0 %
Lymphocytes Relative: 30 %
Lymphs Abs: 2.3 10*3/uL (ref 0.7–4.0)
MCH: 30.8 pg (ref 26.0–34.0)
MCHC: 33.9 g/dL (ref 30.0–36.0)
MCV: 90.7 fL (ref 80.0–100.0)
Monocytes Absolute: 0.6 10*3/uL (ref 0.1–1.0)
Monocytes Relative: 8 %
Neutro Abs: 4.5 10*3/uL (ref 1.7–7.7)
Neutrophils Relative %: 59 %
Platelet Count: 259 10*3/uL (ref 150–400)
RBC: 4.52 MIL/uL (ref 3.87–5.11)
RDW: 13.1 % (ref 11.5–15.5)
WBC Count: 7.6 10*3/uL (ref 4.0–10.5)
nRBC: 0 % (ref 0.0–0.2)

## 2023-06-20 LAB — CMP (CANCER CENTER ONLY)
ALT: 15 U/L (ref 0–44)
AST: 18 U/L (ref 15–41)
Albumin: 4.6 g/dL (ref 3.5–5.0)
Alkaline Phosphatase: 69 U/L (ref 38–126)
Anion gap: 6 (ref 5–15)
BUN: 17 mg/dL (ref 6–20)
CO2: 30 mmol/L (ref 22–32)
Calcium: 9.7 mg/dL (ref 8.9–10.3)
Chloride: 103 mmol/L (ref 98–111)
Creatinine: 0.65 mg/dL (ref 0.44–1.00)
GFR, Estimated: >60 mL/min (ref >60)
Glucose, Bld: 101 mg/dL — ABNORMAL HIGH (ref 70–99)
Potassium: 3.8 mmol/L (ref 3.5–5.1)
Sodium: 139 mmol/L (ref 135–145)
Total Bilirubin: 0.4 mg/dL (ref 0.0–1.2)
Total Protein: 7.2 g/dL (ref 6.5–8.1)

## 2023-06-20 NOTE — Assessment & Plan Note (Signed)
 12/16/2019:Screening mammogram  showed a right breast asymmetry. Diagnostic mammogram showed a 2.9cm mass at the 9 o'clock position with 2 adjacent masses at the 10 and 8 o'clock positions, 0.9cm and 1.4cm respectively, and an enlarged lymph node, 1.3cm.  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 Neoadjuvant chemotherapy with TCH Perjeta 6 cycles followed by Herceptin and Perjeta versus Kadcyla maintenance for 1 year completed 12/29/2020 2. 05/17/20: Right lumpectomy ([facility]): scattered foci of residual invasive lobular carcinoma, 0.6-1.0cm, grade 2, clear margins, 2 right axillary lymph nodes negative for carcinoma. 3. Adjuvant radiation therapy completed April 2022 4.  Adjuvant antiestrogen therapy started 11/17/2020 switched from letrozole to anastrozole 06/01/2021 5.  Followed by neratinib (started in February 2023) SWOG S 1714 neuropathy clinical trial: No adverse effects from participating in the trial ---------------------------------------------------------------------------------------------------------------------------------------------  Anastrozole toxicities:   Rt shoulder pain: Improving with dry needling Patient would like to stay on the same dosage. Right TMJ pain: Received steroid injection   Neratinib toxicities:   1.  Diarrhea: Uses Imodium She is currently taking 5 tablets a day, we will continue with the same dosage. She will complete her 1 year of therapy by April 2024 and then she will discontinue it.   breast cancer surveillance: Mammogram 01/08/2023: Benign breast density category C Breast MRI 07/03/2022: Indeterminate enhancing mass UOQ right breast 0.5 cm, indeterminate enhancing mass retroareolar left breast 0.4 cm (biopsies: Benign)   Because she has lobular breast cancer, I recommended that we switch her from MRIs to  contrast-enhanced mammograms annually.  This will hopefully limit the number of these false positive findings and requirement for biopsies.   Chemo-induced peripheral neuropathy: Stable.   Return to clinic in 1 year for follow-up

## 2023-06-20 NOTE — Progress Notes (Signed)
 Patient Care Team: Macy Mis, MD as PCP - General (Family Medicine) Donnelly Angelica, RN as Oncology Nurse Navigator Lu Duffel, Margretta Ditty, RN as Oncology Nurse Navigator Harriette Bouillon, MD as Consulting Physician (General Surgery) Serena Croissant, MD as Consulting Physician (Hematology and Oncology) Antony Blackbird, MD as Consulting Physician (Radiation Oncology) Axel Filler Larna Daughters, NP as Nurse Practitioner (Hematology and Oncology) Maryclare Labrador, RN as Registered Nurse  DIAGNOSIS:  Encounter Diagnosis  Name Primary?   Malignant neoplasm of upper-outer quadrant of right breast in female, estrogen receptor positive (HCC) Yes    SUMMARY OF ONCOLOGIC HISTORY: Oncology History  Malignant neoplasm of upper-outer quadrant of right breast in female, estrogen receptor positive (HCC)  12/16/2019 Initial Diagnosis   Screening mammogram  showed a right breast asymmetry. Diagnostic mammogram showed a 2.9cm mass at the 9 o'clock position with 2 adjacent masses at the 10 and 8 o'clock positions, 0.9cm and 1.4cm respectively, and an enlarged lymph node, 1.3cm.  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 The report date is 12/31/2019.   The Multi-Cancer Panel offered by [facility] includes sequencing and/or deletion duplication testing of the following 85 genes: AIP, ALK, APC, ATM, AXIN2,BAP1,  BARD1, BLM, BMPR1A, BRCA1, BRCA2, BRIP1, CASR, CDC73, CDH1, CDK4, CDKN1B, CDKN1C, CDKN2A (p14ARF), CDKN2A (p16INK4a), CEBPA, CHEK2, CTNNA1, DICER1, DIS3L2, EGFR (c.2369C>T, p.Thr790Met variant only), EPCAM (Deletion/duplication testing only), FH, FLCN, GATA2, GPC3, GREM1 (Promoter region deletion/duplication testing only), HOXB13 (c.251G>A,  p.Gly84Glu), HRAS, KIT, MAX, MEN1, MET, MITF (c.952G>A, p.Glu318Lys variant only), MLH1, MSH2, MSH3, MSH6, MUTYH, NBN, NF1, NF2, NTHL1, PALB2, PDGFRA, PHOX2B, PMS2, POLD1, POLE, POT1, PRKAR1A, PTCH1, PTEN, RAD50, RAD51C, RAD51D, RB1, RECQL4, RET, RNF43, RUNX1, SDHAF2, SDHA (sequence changes only), SDHB, SDHC, SDHD, SMAD4, SMARCA4, SMARCB1, SMARCE1, STK11, SUFU, TERC, TERT, TMEM127, TP53, TSC1, TSC2, VHL, WRN and WT1.   01/06/2020 - 04/22/2020 Chemotherapy   Neoadjuvant chemo with TCH Perjeta x6 cycles       05/17/2020 Surgery   Right lumpectomy ([facility]): scattered foci of residual invasive lobular carcinoma, 0.6-1.0cm, grade 2, clear margins, 2 right axillary lymph nodes negative for carcinoma.   06/02/2020 - 12/29/2020 Chemotherapy   Patient is on Treatment Plan : BREAST ADO-Trastuzumab Emtansine (Kadcyla) q21d     06/24/2020 - 07/22/2020 Radiation Therapy   Adj XRT   11/17/2020 -  Anti-estrogen oral therapy   Letrozole 2.5 mg daily     CHIEF COMPLIANT: Follow-up on anastrozole therapy  HISTORY OF PRESENT ILLNESS:   History of Present Illness Michelle Anthony, a patient with a history of breast cancer, presents with discomfort and stiffness, which she attributes to her current medication, Anastrozole. She has been on this medication for two and a half years and expresses a desire to know when she can stop taking it. She also reports fatigue, although she notes that it is not as severe as when she was on Neratinib. Despite the fatigue, she reports walking with good energy. She has been trying to improve her physical fitness by stretching and strength training, using five-pound weights. She expresses concern about potential muscle loss and subsequent fat gain, although she acknowledges that this could be a result of aging rather than the medication.     ALLERGIES:  has no known allergies.  MEDICATIONS:  Current Outpatient Medications  Medication Sig Dispense Refill   anastrozole (ARIMIDEX) 1 MG tablet  TAKE 1 TABLET BY MOUTH EVERY DAY 30 tablet 11   CALCIUM/MAGNESIUM/ZINC FORMULA PO Take 1,000 mg by mouth daily.     COLLAGEN PO Take by mouth daily. Pt takes two scoops of powder in morning and night in coffee and almond milk     Cyanocobalamin (VITAMIN B-12) 6000 MCG SUBL Place under the tongue daily.     ibuprofen (ADVIL) 200 MG tablet Take 3 tablets (600 mg total) by mouth every 8 (eight) hours as needed.     Multiple Vitamin (MULTIVITAMIN) tablet Take 1 tablet by mouth daily.     Tretinoin (RETIN-A EX) Apply topically.     UNABLE TO FIND Med Name: [facility]. One scoop daily     UNABLE TO FIND Med Name: Prebiothrive. One scoop daily     No current facility-administered medications for this visit.    PHYSICAL EXAMINATION: ECOG PERFORMANCE STATUS: 1 - Symptomatic but completely ambulatory  There were no vitals filed for this visit. There were no vitals filed for this visit.    LABORATORY DATA:  I have reviewed the data as listed    Latest Ref Rng & Units 06/20/2023   10:48 AM 06/05/2022    7:52 AM 02/23/2022    7:58 AM  CMP  Glucose 70 - 99 mg/dL 440  89  87   BUN 6 - 20 mg/dL 17  19  20    Creatinine 0.44 - 1.00 mg/dL 1.02  7.25  3.66   Sodium 135 - 145 mmol/L 139  140  142   Potassium 3.5 - 5.1 mmol/L 3.8  3.8  3.6   Chloride 98 - 111 mmol/L 103  105  106   CO2 22 - 32 mmol/L 30  29  29    Calcium 8.9 - 10.3 mg/dL 9.7  9.4  9.6   Total Protein 6.5 - 8.1 g/dL 7.2  6.7  7.3   Total Bilirubin 0.0 - 1.2 mg/dL 0.4  0.4  0.5   Alkaline Phos 38 - 126 U/L 69  60  56   AST 15 - 41 U/L 18  16  19    ALT 0 - 44 U/L 15  16  17      Lab Results  Component Value Date   WBC 7.6 06/20/2023   HGB 13.9 06/20/2023   HCT 41.0 06/20/2023   MCV 90.7 06/20/2023   PLT 259 06/20/2023   NEUTROABS 4.5 06/20/2023    ASSESSMENT & PLAN:  Malignant neoplasm of upper-outer quadrant of right breast in female, estrogen receptor positive (HCC) 12/16/2019:Screening mammogram  showed a right breast  asymmetry. Diagnostic mammogram showed a 2.9cm mass at the 9 o'clock position with 2 adjacent masses at the 10 and 8 o'clock positions, 0.9cm and 1.4cm respectively, and an enlarged lymph node, 1.3cm.  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 Neoadjuvant chemotherapy with TCH Perjeta 6 cycles followed by Herceptin and Perjeta versus Kadcyla maintenance for 1 year completed 12/29/2020 2. 05/17/20: Right lumpectomy ([facility]): scattered foci of residual invasive lobular carcinoma, 0.6-1.0cm, grade 2, clear margins, 2 right axillary lymph nodes negative for carcinoma. 3. Adjuvant radiation therapy completed April 2022 4.  Adjuvant antiestrogen therapy started 11/17/2020 switched from letrozole to anastrozole 06/01/2021 5.  Followed by neratinib (  started in February 2023) completed April 2024 SWOG S 1714 neuropathy clinical trial: No adverse effects from participating in the trial ---------------------------------------------------------------------------------------------------------------------------------------------  Anastrozole toxicities:   Rt shoulder pain: Improving with dry needling Patient would like to stay on the same dosage. Right TMJ pain: Received steroid injection   breast cancer surveillance: Mammogram 01/08/2023: Benign breast density category C Breast MRI 07/03/2022: Indeterminate enhancing mass UOQ right breast 0.5 cm, indeterminate enhancing mass retroareolar left breast 0.4 cm (biopsies: Benign)   Because she has lobular breast cancer, I recommended that we switch her from MRIs to contrast-enhanced mammograms annually.  This will hopefully limit the number of these false positive findings and requirement for biopsies.   Recommend guardant reveal for MRD testing  Chemo-induced peripheral neuropathy: Stable.   Return to clinic in 1 year  for follow-up ------------------------------------- Assessment and Plan Assessment & Plan Breast Cancer Three years cancer-free. On anastrozole for two and a half years with stiffness and soreness likely due to medication. Discussed Gardant blood test for detecting breast cancer DNA and switching to contrast-enhanced mammogram (CEM). - Continue anastrozole for at least five years. - Order Gardant blood test every six months with home visits. - Switch to contrast-enhanced mammogram (CEM) in September. - Discontinue routine blood work at the clinic.  Bone Health Normal bone density with T-score of minus one. Stable bone health with potential future risk of osteopenia. - Recheck bone density in two years. - Encourage continued physical activity and calcium intake.  General Health Maintenance Overall health well-managed. Engaging in stretching and strength training for muscle soreness and stiffness. - Consider turmeric supplements, starting with 500 mg daily.      Orders Placed This Encounter  Procedures   MM Digital Screening    Standing Status:   Future    Expected Date:   01/09/2024    Expiration Date:   06/19/2024    Reason for Exam (SYMPTOM  OR DIAGNOSIS REQUIRED):   CEM    Is the patient pregnant?:   No    Preferred imaging location?:   External             [facility]    Release to patient:   Immediate   The patient has a good understanding of the overall plan. she agrees with it. she will call with any problems that may develop before the next visit here. Total time spent: 30 mins including face to face time and time spent for planning, charting and co-ordination of care   Tamsen Meek, MD 06/20/23

## 2023-06-25 ENCOUNTER — Telehealth: Payer: Self-pay

## 2023-06-25 NOTE — Telephone Encounter (Signed)
 Per md orders entered for [facility]ant Reveal and all supported documents faxed to [phone]. Faxed confirmation was received.

## 2023-06-28 ENCOUNTER — Telehealth: Payer: Self-pay

## 2023-06-28 DIAGNOSIS — Z170 Estrogen receptor positive status [ER+]: Secondary | ICD-10-CM

## 2023-06-28 NOTE — Telephone Encounter (Signed)
 N8295 - A Prospective Observational Cohort Study to Develop a Predictive Model of Taxane-Induced Peripheral Neuropathy in Cancer Patients   156 Week Follow Up:   Research nurse returned call to patient at her request to complete her last follow up for the S1714 protocol. She states she has just now had a chance to return my call from earlier this year. Research nurse explained this was the last follow up required by the study, so she has completed her participation. The patient was able to answer all her follow up questions without any difficulty. States she has numbness and tingling still in her feet mostly. Her hands and fingers are alright. She states the feeling in her feet is constant and at night she feels like she may lose her balance. She states she always where her slippers and does not go barefoot at night because of that. States she doesn't really have pain, but more discomfort. Denies any difficulty performing her ADL due to the neuropathy symptoms. She is currently taking multivitamins, vitamin B-12 and turmeric for antiinflammatory effects. FACT /GOG, EORTC [id number], and the PRO-CTCAE were completed without any difficulty. The patient was encouraged to call if she has any further questions or concerns for research. Chriss Driver, RN 06/28/23 2:50 PM

## 2023-07-16 ENCOUNTER — Telehealth: Payer: Self-pay

## 2023-07-16 NOTE — Telephone Encounter (Signed)
 Called pt per MD to advise Guardant Reveal testing was negative/not detected. Pt verbalized understanding of results and knows [facility] will be in touch to schedule 6 mo repeat lab.

## 2023-07-17 ENCOUNTER — Encounter: Payer: Self-pay | Admitting: Hematology and Oncology

## 2023-12-31 ENCOUNTER — Telehealth: Payer: Self-pay | Admitting: *Deleted

## 2023-12-31 NOTE — Telephone Encounter (Signed)
 Per MD request RN placed call to pt with recent Guardant Reveal results being negative.  Pt educated and verbalized understanding.

## 2024-01-02 ENCOUNTER — Encounter: Payer: Self-pay | Admitting: Hematology and Oncology

## 2024-01-16 ENCOUNTER — Encounter: Payer: Self-pay | Admitting: Hematology and Oncology

## 2024-03-28 ENCOUNTER — Other Ambulatory Visit: Payer: Self-pay | Admitting: Hematology and Oncology

## 2024-06-22 ENCOUNTER — Ambulatory Visit: Admitting: Hematology and Oncology
